# Patient Record
Sex: Female | Born: 1990
Health system: Southern US, Community
[De-identification: ages and names within clinical notes are randomized; demographics above are authoritative.]

## PROBLEM LIST (undated history)

## (undated) DIAGNOSIS — F419 Anxiety disorder, unspecified: Secondary | ICD-10-CM

## (undated) DIAGNOSIS — R12 Heartburn: Secondary | ICD-10-CM

## (undated) DIAGNOSIS — K219 Gastro-esophageal reflux disease without esophagitis: Secondary | ICD-10-CM

## (undated) DIAGNOSIS — F329 Major depressive disorder, single episode, unspecified: Secondary | ICD-10-CM

## (undated) DIAGNOSIS — R112 Nausea with vomiting, unspecified: Secondary | ICD-10-CM

## (undated) DIAGNOSIS — F32A Depression, unspecified: Secondary | ICD-10-CM

## (undated) DIAGNOSIS — E7800 Pure hypercholesterolemia, unspecified: Secondary | ICD-10-CM

## (undated) DIAGNOSIS — R011 Cardiac murmur, unspecified: Secondary | ICD-10-CM

## (undated) DIAGNOSIS — F410 Panic disorder [episodic paroxysmal anxiety] without agoraphobia: Secondary | ICD-10-CM

## (undated) DIAGNOSIS — E119 Type 2 diabetes mellitus without complications: Secondary | ICD-10-CM

## (undated) DIAGNOSIS — F429 Obsessive-compulsive disorder, unspecified: Secondary | ICD-10-CM

## (undated) DIAGNOSIS — Z9889 Other specified postprocedural states: Secondary | ICD-10-CM

## (undated) HISTORY — DX: Anxiety disorder, unspecified: F41.9

## (undated) HISTORY — PX: NO PAST SURGERIES: SHX2092

## (undated) HISTORY — DX: Major depressive disorder, single episode, unspecified: F32.9

## (undated) HISTORY — DX: Depression, unspecified: F32.A

## (undated) HISTORY — DX: Panic disorder (episodic paroxysmal anxiety): F41.0

## (undated) HISTORY — PX: OTHER SURGICAL HISTORY: SHX169

---
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
## 2012-07-04 ENCOUNTER — Inpatient Hospital Stay: Payer: Self-pay | Admitting: Psychiatry

## 2012-07-04 LAB — CBC
HCT: 42.9 % (ref 35.0–47.0)
HGB: 14.6 g/dL (ref 12.0–16.0)
MCHC: 33.9 g/dL (ref 32.0–36.0)
MCV: 89 fL (ref 80–100)
Platelet: 368 10*3/uL (ref 150–440)
RBC: 4.84 10*6/uL (ref 3.80–5.20)
RDW: 13.5 % (ref 11.5–14.5)
WBC: 13.6 10*3/uL — ABNORMAL HIGH (ref 3.6–11.0)

## 2012-07-04 LAB — URINALYSIS, COMPLETE
Ph: 6 (ref 4.5–8.0)
Protein: 30
Specific Gravity: 1.026 (ref 1.003–1.030)
Squamous Epithelial: 4

## 2012-07-04 LAB — COMPREHENSIVE METABOLIC PANEL
Albumin: 4.5 g/dL (ref 3.8–5.6)
Anion Gap: 8 (ref 7–16)
BUN: 11 mg/dL (ref 7–18)
Bilirubin,Total: 0.5 mg/dL (ref 0.2–1.0)
Calcium, Total: 9.8 mg/dL (ref 9.0–10.7)
Co2: 25 mmol/L (ref 21–32)
Creatinine: 0.73 mg/dL (ref 0.60–1.30)
EGFR (African American): >60
Glucose: 86 mg/dL (ref 65–99)
Osmolality: 274 (ref 275–301)
SGOT(AST): 21 U/L (ref 0–26)
Total Protein: 9.4 g/dL — ABNORMAL HIGH (ref 6.4–8.6)

## 2012-07-04 LAB — ETHANOL
Ethanol %: <0.003 % (ref 0.000–0.080)
Ethanol: <3 mg/dL

## 2012-07-04 LAB — DRUG SCREEN, URINE
Benzodiazepine, Ur Scrn: NEGATIVE (ref ?–200)
Cannabinoid 50 Ng, Ur ~~LOC~~: NEGATIVE (ref ?–50)
MDMA (Ecstasy)Ur Screen: NEGATIVE (ref ?–500)
Opiate, Ur Screen: NEGATIVE (ref ?–300)
Phencyclidine (PCP) Ur S: NEGATIVE (ref ?–25)
Tricyclic, Ur Screen: NEGATIVE (ref ?–1000)

## 2012-07-04 LAB — TSH: Thyroid Stimulating Horm: 1.42 u[IU]/mL

## 2014-10-15 NOTE — Consult Note (Signed)
Psychological Assessment  Claris CheMargaret [id number] Evaluation: 1-12-14Administered: [facility] Multiphasic Personality Inventory-2 (MMPI-2) for Referral: [name] was referred for a psychological assessment by her physician, Kristine LineaJolanta Pucilowska, MD. She was admitted to Behavioral Medicine for the treatment of suicidal ideation in the context of severe anxiety.  Please see the history and physical and psychosocial history for further background information. An assessment of personality structure was requested. [name]? MMPI-2 protocol is compared to that of other adult females she obtained the following profile: [id number]/:#. The MMPI-2 validity scales indicate that the clinical profile is valid. They also suggest she is experiencing a long standing and serious disturbance. Presentation She reports that she is experiencing a moderate to severe level of emotional distress characterized by dysphoric mood and agitation. She frequently worries about something or someone. She is chronically stressed, and she becomes more agitated or withdrawn as her level of stress increases. She is generally apprehensive and fearful of her environment. Several times a week she feels as if something dreadful is about to happen. She obtains little pleasure from life and is likely to be anhedonic. Her daily life has few things that keep her interested. She is more sensitive and feels more intensely than most people. Her feelings are easily hurt and she is inclined to take things hard. She easily becomes impatient with people. She is often irritable and grouchy. It makes her angry when people hurry her. She also gets angry with herself for giving in to others so much. She has become so angry that she does not know what comes over her and she feels as though she will explode. At times she has a strong urge to do something shocking or harmful. She reports that she has problems with attention, concentration but not memory. She  lacks self-confidence and believes that she is not as good as other people. She has a hard time making decisions and she feels helpless when she has to make some important decisions. She lets other people take charge and make decisions. She has often lost out on things because she could not make up her mind quickly enough. Sometimes some unimportant thought will run through her mind and bother her for days. has had strange and peculiar thoughts and thinks of things that are too bad to talk about. She often thinks that things are not real, there is something wrong with her mind and she is about to go to pieces. Her judgment is not as good as it once was. She has often been misunderstood when she was trying to be helpful. She does many things that she later regrets. Relations: She reports that she is mildly introverted. She may have difficulties with social skills. At parties she is more likely to sit by herself or with one other person than to join in with the crowd. When in a group of people she has trouble thinking of the right things to talk about. She is likely not to speak to people until they speak to her. She is easily embarrassed and wishes she was not so shy. Even when she is with people, she feels lonely much of the time.  Problem Areas: She reports a number of physical symptoms. Her sleep is fitful and disturbed and she does not wake up fresh and rested most mornings. She tires quickly and feels weak and tired a good deal of the time. She does not have enough energy to do her work and she is not as able to work as she once was.  She is worried about and bothered by thoughts about sex. She is likely to have suicidal ideation that should be monitored carefully. She is hopeless, which increases the risk of suicide.  Her prognosis is generally poor given the characterologic nature of her problems and her diminished motivation to work. Establishing a therapeutic relationship is very challenging because of  the character pathology that is present. Psychopharmacologic intervention may be necessary to decrease her level of agitation and to help her sleep. Cognitive-behavioral interventions focused on her depressive and anxious cognitive processes will be beneficial.  Impression:Disorder NOSDepressive DisorderDisorder NOS with dependent features                            Electronic Signatures: Carola Frost (PsyD, HSP-P)  (Signed on 13-Jan-14 14:58)  Authored  Last Updated: 13-Jan-14 14:58 by Carola Frost (PsyD, HSP-P)

## 2014-10-15 NOTE — H&P (Signed)
PATIENT NAME:  Latasha Alexander, Latasha Alexander MR#:  [id number] DATE OF BIRTH:  03/22/1991  DATE OF ADMISSION:  07/04/2012  REFERRING PHYSICIAN: Cecille Amsterdam. Mayford Knife, MD   ATTENDING PHYSICIAN: Daniyal Tabor B. Shanyn Preisler, MD   IDENTIFYING DATA: The patient is a 24 year old female with history of severe anxiety.   CHIEF COMPLAINT: "Alexander want to kill myself."   HISTORY OF PRESENT ILLNESS: The patient has been increasingly anxious since the holidays. Two days ago, she started a new job, has been worried all along that she will not be able to handle it and that people will make fun of her. She started experiencing multiple panic attacks everyday with excessive worries and agoraphobia. She has been increasingly thinking of suicide. Today on her way back from work, she had intrusive suicidal thoughts and wanted to kill herself, feeling that this would be the best way to handle her excessive anxiety. She decided to call her mother and came to the hospital. She reports poor sleep, decreased appetite, anhedonia, feelings of guilt, hopelessness, worthlessness, poor memory and concentration, crying spells, social isolation and extreme anxiety. She reports panic attacks with palpitations, difficulties breathing, shaking, feeling of impending doom. They happen multiple times during the day. She is currently on no medications, not in therapy. She denies any psychotic symptoms. There is no alcohol or prescription pill abuse. She did smoke cannabis around Christmas and has been preoccupied with the idea that smoking pot could bring back severe anxiety that she did experience in the past.   PAST PSYCHIATRIC HISTORY: There were no suicide attempts. However, the patient had 2 periods in her life at the age of 79 when she was teased at school and then at the age of 7 when her boyfriend moved out of the area when she experienced a month or 2 period of excessive anxiety and panic attacks. When she was a child, she was in therapy for it. Two years  ago, it lasted for a month and eventually went away and she was supported by the family and friends.   FAMILY PSYCHIATRIC HISTORY: None reported.   PAST MEDICAL HISTORY: None.   ALLERGIES: No known drug allergies.   MEDICATIONS ON ADMISSION: None.   SOCIAL HISTORY: She graduated from high school. She lives with her mother. She just started a new job that she is ready to quit.   REVIEW OF SYSTEMS:  CONSTITUTIONAL: No fevers or chills. No weight changes.  EYES: No double or blurred vision.  ENT: No hearing loss.  RESPIRATORY: No shortness of breath or cough.  CARDIOVASCULAR: No chest pain or orthopnea.  GASTROINTESTINAL: No abdominal pain, nausea, vomiting or diarrhea.  GENITOURINARY: No incontinence or frequency.  ENDOCRINE: No heat or cold intolerance.  LYMPHATIC: No anemia or easy bruising.  INTEGUMENTARY: No acne or rash.  MUSCULOSKELETAL: No muscle or joint pain.  NEUROLOGIC: No tingling or weakness.  PSYCHIATRIC: See history of present illness for details.   PHYSICAL EXAMINATION:  VITAL SIGNS: Blood pressure 183/74, pulse 104, respirations 20, temperature 98.1.  GENERAL: This is a slightly obese young female, crying and super anxious.  HEENT: The pupils are equal, round and reactive to light. Sclerae anicteric.  NECK: Supple. No thyromegaly.  LUNGS: Clear to auscultation. No dullness to percussion.  HEART: Regular rhythm and rate. No murmurs, rubs or gallops.  ABDOMEN: Soft, nontender, nondistended. Positive bowel sounds.  MUSCULOSKELETAL: Normal muscle strength in all extremities.  SKIN: No rashes or bruises.  LYMPHATIC: No cervical adenopathy.  NEUROLOGIC: Cranial nerves II through XII  are intact.   LABORATORY DATA: Chemistries are within normal limits. Urine pregnancy test negative. Alcohol level zero. LFTs within normal limits. TSH 1.422. Urine tox screen negative for substances. CBC within normal limits except for white blood count of 13.6. Urinalysis is not  suggestive of urinary tract infection.   MENTAL STATUS EXAMINATION ON ADMISSION: The patient is examined in the Emergency Room. She is accompanied by her mother. She is extremely anxious, crying, panicky. She is oriented to person, place, time and situation. She is pleasant, polite and tries to be cooperative. She is wearing hospital scrubs and a yellow shirt. She maintains good eye contact. Her speech is of normal rhythm, rate and volume. Mood is depressed with labile affect. Thought processing is logical and goal oriented. Thought content: She denies suicidal or homicidal ideation right now, but drove to the hospital frightened by pervasive thoughts of hurting herself. There are no delusions or paranoia. There are no auditory or visual hallucinations. Her cognition is grossly intact. Her insight and judgment are fair.   SUICIDE RISK ASSESSMENT ON ADMISSION: This is a patient with history of depression and panic disorder who became extremely anxious after she started her new job, became suicidal.   DIAGNOSES:  AXIS Alexander: Mood disorder, not otherwise specified. Anxiety disorder, not otherwise specified. Rule out panic disorder with agoraphobia. Marijuana abuse.  AXIS II: Deferred.  AXIS III: None.  AXIS IV: Mental illness, substance abuse, stress of starting a new job.  AXIS V: Global Assessment of Functioning on admission 25.   PLAN: The patient was admitted to [facility] Medicine Unit for safety, stabilization and medication management. She was initially placed on suicide precautions and was closely monitored for any unsafe behaviors. She underwent full psychiatric and risk assessment. She received pharmacotherapy, individual and group psychotherapy, substance abuse counseling and support from therapeutic milieu.  1.  Suicidal ideation: The patient is able to contract for safety.  2.  Mood and anxiety: I do not want to start this patient on SSRI. She is so anxious that  she may feel even worse, as is the case with many SSRIs for the first week I do not want to give her too many benzos. Alexander will start Remeron, even though she is slightly obese, in hopes that we can transition to Zoloft or Paxil later. We will offer some benzodiazepines as needed.  3.  Disposition: She will go home.  ____________________________ Ellin GoodieJolanta B. Jennet MaduroPucilowska, MD jbp:jm D: 07/04/2012 16:29:59 ET T: 07/04/2012 17:16:44 ET JOB#: [id number]  cc: Chasidy Janak B. Jennet MaduroPucilowska, MD, <Dictator> Shari ProwsJOLANTA B Ramsie Ostrander MD ELECTRONICALLY SIGNED 07/07/2012 0:07

## 2015-08-01 ENCOUNTER — Ambulatory Visit
Admission: EM | Admit: 2015-08-01 | Discharge: 2015-08-01 | Disposition: A | Discharge status: Home / Self Care | Payer: BLUE CROSS/BLUE SHIELD | Attending: Family Medicine | Admitting: Family Medicine

## 2015-08-01 ENCOUNTER — Encounter: Payer: Self-pay | Admitting: Emergency Medicine

## 2015-08-01 DIAGNOSIS — S39012A Strain of muscle, fascia and tendon of lower back, initial encounter: Secondary | ICD-10-CM

## 2015-08-01 HISTORY — DX: Obsessive-compulsive disorder, unspecified: F42.9

## 2015-08-01 LAB — URINALYSIS COMPLETE WITH MICROSCOPIC (ARMC ONLY)
BACTERIA UA: NONE SEEN
Bilirubin Urine: NEGATIVE
Glucose, UA: NEGATIVE mg/dL
HGB URINE DIPSTICK: NEGATIVE
Ketones, ur: NEGATIVE mg/dL
Leukocytes, UA: NEGATIVE
NITRITE: NEGATIVE
Protein, ur: NEGATIVE mg/dL
RBC / HPF: NONE SEEN RBC/hpf (ref 0–5)
Specific Gravity, Urine: 1.015 (ref 1.005–1.030)
WBC UA: NONE SEEN WBC/hpf (ref 0–5)
pH: 6.5 (ref 5.0–8.0)

## 2015-08-01 MED ORDER — CYCLOBENZAPRINE HCL 10 MG PO TABS: 10.0000 mg | ORAL_TABLET | Freq: Every day | ORAL | Status: DC

## 2015-08-01 MED ORDER — IBUPROFEN 800 MG PO TABS: 800.0000 mg | ORAL_TABLET | Freq: Three times a day (TID) | ORAL | Status: DC | PRN

## 2015-08-01 NOTE — ED Provider Notes (Signed)
CSN: [id number]     Arrival date & time 08/01/15  1124 History   First MD Initiated Contact with Patient 08/01/15 1223     Chief Complaint  Patient presents with  . Back Pain   (Consider location/radiation/quality/duration/timing/severity/associated sxs/prior Treatment) Patient is a 25 y.o. female presenting with back pain. The history is provided by the patient.  Back Pain Location:  Lumbar spine Quality:  Aching Radiates to:  Does not radiate Pain severity:  Mild Pain is:  Same all the time Onset quality:  Sudden (states woke up one day with the pain) Duration:  4 days Timing:  Constant Progression:  Unchanged Context: not emotional stress, not falling, not jumping from heights, not lifting heavy objects, not MCA, not MVA, not occupational injury, not pedestrian accident, not physical stress, not recent illness, not recent injury and not twisting   Context comment:  Unknown; patient denies any injuries; states woke up one morning with pain Relieved by:  None tried Associated symptoms: no abdominal pain, no abdominal swelling, no bladder incontinence, no bowel incontinence, no chest pain, no fever, no headaches, no leg pain, no numbness, no paresthesias, no pelvic pain, no perianal numbness, no tingling, no weakness and no weight loss     Past Medical History  Diagnosis Date  . OCD (obsessive compulsive disorder)    History reviewed. No pertinent past surgical history. History reviewed. No pertinent family history. Social History  Substance Use Topics  . Smoking status: Never Smoker   . Smokeless tobacco: None  . Alcohol Use: No   OB History    No data available     Review of Systems  Constitutional: Negative for fever and weight loss.  Cardiovascular: Negative for chest pain.  Gastrointestinal: Negative for abdominal pain and bowel incontinence.  Genitourinary: Negative for bladder incontinence and pelvic pain.  Musculoskeletal: Positive for back pain.  Neurological:  Negative for tingling, weakness, numbness, headaches and paresthesias.    Allergies  Review of patient's allergies indicates no known allergies.  Home Medications   Prior to Admission medications   Medication Sig Start Date End Date Taking? Authorizing Provider  ARIPiprazole (ABILIFY) 5 MG tablet Take 5 mg by mouth daily.   Yes Historical Provider, MD  norethindrone (MICRONOR,CAMILA,ERRIN) 0.35 MG tablet Take 1 tablet by mouth daily.   Yes Historical Provider, MD  sertraline (ZOLOFT) 100 MG tablet Take 200 mg by mouth daily.   Yes Historical Provider, MD  cyclobenzaprine (FLEXERIL) 10 MG tablet Take 1 tablet (10 mg total) by mouth at bedtime. 08/01/15   Payton Mccallum, MD  ibuprofen (ADVIL,MOTRIN) 800 MG tablet Take 1 tablet (800 mg total) by mouth every 8 (eight) hours as needed. 08/01/15   Payton Mccallum, MD   Meds Ordered and Administered this Visit  Medications - No data to display  BP 141/79 mmHg  Pulse 70  Temp(Src) 98.5 F (36.9 C) (Tympanic)  Resp 17  Ht  (1.651 m)  Wt 279 lb (126.554 kg)  BMI 46.43 kg/m2  SpO2 99%  LMP 07/24/2015 (Exact Date) No data found.   Physical Exam  Constitutional: She appears well-developed and well-nourished. No distress.  Musculoskeletal: She exhibits tenderness. She exhibits no edema.       Lumbar back: She exhibits tenderness (over the left lumbar paraspinous muscles) and spasm. She exhibits normal range of motion, no bony tenderness, no swelling, no edema, no deformity, no laceration, no pain and normal pulse.  Neurological: She is alert. She has normal reflexes. She exhibits normal  muscle tone.  Skin: Skin is warm and dry. No rash noted. She is not diaphoretic. No erythema.  Nursing note and vitals reviewed.   ED Course  Procedures (including critical care time)  Labs Review Labs Reviewed  URINALYSIS COMPLETEWITH MICROSCOPIC [facility] ONLY) - Abnormal; Notable for the following:    Squamous Epithelial / LPF 0-5 (*)    All other  components within normal limits    Imaging Review No results found.   Visual Acuity Review  Right Eye Distance:   Left Eye Distance:   Bilateral Distance:    Right Eye Near:   Left Eye Near:    Bilateral Near:         MDM   1. Lumbar strain, initial encounter    Discharge Medication List as of 08/01/2015 12:45 PM    START taking these medications   Details  cyclobenzaprine (FLEXERIL) 10 MG tablet Take 1 tablet (10 mg total) by mouth at bedtime., Starting 08/01/2015, Until Discontinued, Normal    ibuprofen (ADVIL,MOTRIN) 800 MG tablet Take 1 tablet (800 mg total) by mouth every 8 (eight) hours as needed., Starting 08/01/2015, Until Discontinued, Normal       1. Labs/x-ray results and diagnosis reviewed with patient 2. rx as per orders above; reviewed possible side effects, interactions, risks and benefits  3. Recommend supportive treatment with heat to affected area; gentle low back stretching 4. Follow-up prn if symptoms worsen or don't improve    Payton Mccallum, MD 08/01/15 1331

## 2015-08-01 NOTE — ED Notes (Signed)
Patient c/o lower back pain for the past 4 days.  Patient denies N/V.  Patient denies fevers.

## 2015-09-18 ENCOUNTER — Ambulatory Visit
Admission: EM | Admit: 2015-09-18 | Discharge: 2015-09-18 | Disposition: A | Discharge status: Home / Self Care | Payer: BLUE CROSS/BLUE SHIELD | Attending: Family Medicine | Admitting: Family Medicine

## 2015-09-18 DIAGNOSIS — L049 Acute lymphadenitis, unspecified: Secondary | ICD-10-CM | POA: Diagnosis not present

## 2015-09-18 MED ORDER — AMOXICILLIN-POT CLAVULANATE 875-125 MG PO TABS: 1.0000 | ORAL_TABLET | Freq: Two times a day (BID) | ORAL | Status: DC

## 2015-09-18 NOTE — ED Notes (Signed)
Patient complains of facial/neck swelling. States that she noticed this problem, this morning. Patient states that area hurts when she eats or turns her head.

## 2015-09-18 NOTE — ED Provider Notes (Signed)
CSN: [id number]     Arrival date & time 09/18/15  1327 History   First MD Initiated Contact with Patient 09/18/15 1526     Chief Complaint  Patient presents with  . Facial Swelling   (Consider location/radiation/quality/duration/timing/severity/associated sxs/prior Treatment) HPI Comments: Patient presents with 1 day onset right mandibular facial swelling.  No fever,chills, otalgia, odontalgia, or TMJ discomfort.  Patient recently had her frenula pierced.   The history is provided by the patient.    Past Medical History  Diagnosis Date  . OCD (obsessive compulsive disorder)    Past Surgical History  Procedure Laterality Date  . No past surgeries     History reviewed. No pertinent family history. Social History  Substance Use Topics  . Smoking status: Never Smoker   . Smokeless tobacco: None  . Alcohol Use: No   OB History    No data available     Review of Systems  Constitutional: Negative for fever, chills and diaphoresis.  HENT: Positive for facial swelling. Negative for congestion, ear pain, hearing loss and postnasal drip.   Eyes: Negative.  Negative for discharge and redness.  Respiratory: Negative.  Negative for cough, shortness of breath and wheezing.   Cardiovascular: Negative.   Endocrine: Negative.   Musculoskeletal: Positive for neck pain. Negative for myalgias.  Skin: Negative for color change, pallor and rash.    Allergies  Review of patient's allergies indicates no known allergies.  Home Medications   Prior to Admission medications   Medication Sig Start Date End Date Taking? Authorizing Provider  ARIPiprazole (ABILIFY) 5 MG tablet Take 5 mg by mouth daily.   Yes Historical Provider, MD  ibuprofen (ADVIL,MOTRIN) 800 MG tablet Take 1 tablet (800 mg total) by mouth every 8 (eight) hours as needed. 08/01/15  Yes Payton Mccallumrlando Conty, MD  norethindrone (MICRONOR,CAMILA,ERRIN) 0.35 MG tablet Take 1 tablet by mouth daily.   Yes Historical Provider, MD  sertraline  (ZOLOFT) 100 MG tablet Take 200 mg by mouth daily.   Yes Historical Provider, MD  amoxicillin-clavulanate (AUGMENTIN) 875-125 MG tablet Take 1 tablet by mouth every 12 (twelve) hours. 09/18/15   Duanne Limerickeanna C Jones, MD  cyclobenzaprine (FLEXERIL) 10 MG tablet Take 1 tablet (10 mg total) by mouth at bedtime. 08/01/15   Payton Mccallumrlando Conty, MD   Meds Ordered and Administered this Visit  Medications - No data to display  BP 117/62 mmHg  Pulse 72  Temp(Src) 98 F (36.7 C) (Tympanic)  Resp 17  Ht 5\' 5"  (1.651 m)  Wt 277 lb (125.646 kg)  BMI 46.10 kg/m2  SpO2 100%  LMP 08/21/2015 No data found.   Physical Exam  Constitutional: She appears well-developed and well-nourished.  HENT:  Head: Normocephalic.  Right Ear: External ear normal.  Left Ear: External ear normal.  Nose: Nose normal.  Mouth/Throat: Uvula is midline and mucous membranes are normal. Posterior oropharyngeal erythema present. No oropharyngeal exudate, posterior oropharyngeal edema or tonsillar abscesses.  Frenula without swelling or erythema.  Eyes: Pupils are equal, round, and reactive to light.  Neck: Normal range of motion. Neck supple. No thyromegaly present.  Tenderness mild right submandibular  Lymphadenopathy:    She has cervical adenopathy.  Nursing note and vitals reviewed.   ED Course  Procedures (including critical care time)  Labs Review Labs Reviewed - No data to display  Imaging Review No results found.   Visual Acuity Review  Right Eye Distance:   Left Eye Distance:   Bilateral Distance:    Right Eye Near:  Left Eye Near:    Bilateral Near:         MDM   1. Adenitis, acute    New Prescriptions   AMOXICILLIN-CLAVULANATE (AUGMENTIN) 875-125 MG TABLET    Take 1 tablet by mouth every 12 (twelve) hours.      Duanne Limerick, MD 09/18/15 [id number]

## 2016-06-06 ENCOUNTER — Ambulatory Visit (INDEPENDENT_AMBULATORY_CARE_PROVIDER_SITE_OTHER): Payer: Medicaid Other

## 2016-06-06 VITALS — BP 127/83 | HR 81 | Ht 65.0 in | Wt 301.4 lb

## 2016-06-06 DIAGNOSIS — Z369 Encounter for antenatal screening, unspecified: Secondary | ICD-10-CM

## 2016-06-06 DIAGNOSIS — Z6841 Body Mass Index (BMI) 40.0 and over, adult: Secondary | ICD-10-CM

## 2016-06-06 DIAGNOSIS — Z113 Encounter for screening for infections with a predominantly sexual mode of transmission: Secondary | ICD-10-CM

## 2016-06-06 DIAGNOSIS — Z1389 Encounter for screening for other disorder: Secondary | ICD-10-CM

## 2016-06-06 DIAGNOSIS — Z3401 Encounter for supervision of normal first pregnancy, first trimester: Secondary | ICD-10-CM

## 2016-06-06 DIAGNOSIS — N926 Irregular menstruation, unspecified: Secondary | ICD-10-CM

## 2016-06-06 DIAGNOSIS — O99211 Obesity complicating pregnancy, first trimester: Secondary | ICD-10-CM

## 2016-06-06 NOTE — Patient Instructions (Signed)
Commonly Asked Questions During Pregnancy  Cats: A parasite can be excreted in cat feces.  To avoid exposure you need to have another person empty the little box.  If you must empty the litter box you will need to wear gloves.  Wash your hands after handling your cat.  This parasite can also be found in raw or undercooked meat so this should also be avoided.  Colds, Sore Throats, Flu: Please check your medication sheet to see what you can take for symptoms.  If your symptoms are unrelieved by these medications please call the office.  Dental Work: Most any dental work your dentist recommends is permitted.  X-rays should only be taken during the first trimester if absolutely necessary.  Your abdomen should be shielded with a lead apron during all x-rays.  Please notify your provider prior to receiving any x-rays.  Novocaine is fine; gas is not recommended.  If your dentist requires a note from us prior to dental work please call the office and we will provide one for you.  Exercise: Exercise is an important part of staying healthy during your pregnancy.  You may continue most exercises you were accustomed to prior to pregnancy.  Later in your pregnancy you will most likely notice you have difficulty with activities requiring balance like riding a bicycle.  It is important that you listen to your body and avoid activities that put you at a higher risk of falling.  Adequate rest and staying well hydrated are a must!  If you have questions about the safety of specific activities ask your provider.    Exposure to Children with illness: Try to avoid obvious exposure; report any symptoms to us when noted,  If you have chicken pos, red measles or mumps, you should be immune to these diseases.   Please do not take any vaccines while pregnant unless you have checked with your OB provider.  Fetal Movement: After 28 weeks we recommend you do "kick counts" twice daily.  Lie or sit down in a calm quiet environment and  count your baby movements "kicks".  You should feel your baby at least 10 times per hour.  If you have not felt 10 kicks within the first hour get up, walk around and have something sweet to eat or drink then repeat for an additional hour.  If count remains less than 10 per hour notify your provider.  Fumigating: Follow your pest control agent's advice as to how long to stay out of your home.  Ventilate the area well before re-entering.  Hemorrhoids:   Most over-the-counter preparations can be used during pregnancy.  Check your medication to see what is safe to use.  It is important to use a stool softener or fiber in your diet and to drink lots of liquids.  If hemorrhoids seem to be getting worse please call the office.   Hot Tubs:  Hot tubs Jacuzzis and saunas are not recommended while pregnant.  These increase your internal body temperature and should be avoided.  Intercourse:  Sexual intercourse is safe during pregnancy as long as you are comfortable, unless otherwise advised by your provider.  Spotting may occur after intercourse; report any bright red bleeding that is heavier than spotting.  Labor:  If you know that you are in labor, please go to the hospital.  If you are unsure, please call the office and let us help you decide what to do.  Lifting, straining, etc:  If your job requires heavy   lifting or straining please check with your provider for any limitations.  Generally, you should not lift items heavier than that you can lift simply with your hands and arms (no back muscles)  Painting:  Paint fumes do not harm your pregnancy, but may make you ill and should be avoided if possible.  Latex or water based paints have less odor than oils.  Use adequate ventilation while painting.  Permanents & Hair Color:  Chemicals in hair dyes are not recommended as they cause increase hair dryness which can increase hair loss during pregnancy.  " Highlighting" and permanents are allowed.  Dye may be  absorbed differently and permanents may not hold as well during pregnancy.  Sunbathing:  Use a sunscreen, as skin burns easily during pregnancy.  Drink plenty of fluids; avoid over heating.  Tanning Beds:  Because their possible side effects are still unknown, tanning beds are not recommended.  Ultrasound Scans:  Routine ultrasounds are performed at approximately 20 weeks.  You will be able to see your baby's general anatomy an if you would like to know the gender this can usually be determined as well.  If it is questionable when you conceived you may also receive an ultrasound early in your pregnancy for dating purposes.  Otherwise ultrasound exams are not routinely performed unless there is a medical necessity.  Although you can request a scan we ask that you pay for it when conducted because insurance does not cover " patient request" scans.  Work: If your pregnancy proceeds without complications you may work until your due date, unless your physician or employer advises otherwise.  Round Ligament Pain/Pelvic Discomfort:  Sharp, shooting pains not associated with bleeding are fairly common, usually occurring in the second trimester of pregnancy.  They tend to be worse when standing up or when you remain standing for long periods of time.  These are the result of pressure of certain pelvic ligaments called "round ligaments".  Rest, Tylenol and heat seem to be the most effective relief.  As the womb and fetus grow, they rise out of the pelvis and the discomfort improves.  Please notify the office if your pain seems different than that described.  It may represent a more serious condition.  Hyperemesis Gravidarum Hyperemesis gravidarum is a severe form of nausea and vomiting that happens during pregnancy. Hyperemesis is worse than morning sickness. It may cause you to have nausea or vomiting all day for many days. It may keep you from eating and drinking enough food and liquids. Hyperemesis usually  occurs during the first half (the first 20 weeks) of pregnancy. It often goes away once a woman is in her second half of pregnancy. However, sometimes hyperemesis continues through an entire pregnancy. What are the causes? The cause of this condition is not known. It may be related to changes in chemicals (hormones) in the body during pregnancy, such as the high level of pregnancy hormone (human chorionic gonadotropin) or the increase in the female sex hormone (estrogen). What are the signs or symptoms? Symptoms of this condition include:  Severe nausea and vomiting.  Nausea that does not go away.  Vomiting that does not allow you to keep any food down.  Weight loss.  Body fluid loss (dehydration).  Having no desire to eat, or not liking food that you have previously enjoyed. How is this diagnosed? This condition may be diagnosed based on:  A physical exam.  Your medical history.  Your symptoms.  Blood tests.    Urine tests. How is this treated? This condition may be managed with medicine. If medicines to do not help relieve nausea and vomiting, you may need to receive fluids through an IV tube at the hospital. Follow these instructions at home:  Take over-the-counter and prescription medicines only as told by your health care provider.  Avoid iron pills and multivitamins that contain iron for the first 3-4 months of pregnancy. If you take prescription iron pills, do not stop taking them unless your health care provider approves.  Take the following actions to help prevent nausea and vomiting:  In the morning, before getting out of bed, try eating a couple of dry crackers or a piece of toast.  Avoid foods and smells that upset your stomach. Fatty and spicy foods may make nausea worse.  Eat 5-6 small meals a day.  Do not drink fluids while eating meals. Drink between meals.  Eat or suck on things that have ginger in them. Ginger can help relieve nausea.  Avoid food  preparation. The smell of food can spoil your appetite or trigger nausea.  Follow instructions from your health care provider about eating or drinking restrictions.  For snacks, eat high-protein foods, such as cheese.  Keep all follow-up and pre-birth (prenatal) visits as told by your health care provider. This is important. Contact a health care provider if:  You have pain in your abdomen.  You have a severe headache.  You have vision problems.  You are losing weight. Get help right away if:  You cannot drink fluids without vomiting.  You vomit blood.  You have constant nausea and vomiting.  You are very weak.  You are very thirsty.  You feel dizzy.  You faint.  You have a fever or other symptoms that last for more than 2-3 days.  You have a fever and your symptoms suddenly get worse. Summary  Hyperemesis gravidarum is a severe form of nausea and vomiting that happens during pregnancy.  Making some changes to your eating habits may help relieve nausea and vomiting.  This condition may be managed with medicine.  If medicines to do not help relieve nausea and vomiting, you may need to receive fluids through an IV tube at the hospital. This information is not intended to replace advice given to you by your health care provider. Make sure you discuss any questions you have with your health care provider. Document Released: 06/11/2005 Document Revised: 02/08/2016 Document Reviewed: 02/08/2016 Elsevier Interactive Patient Education  2017 [employer] Minor Illnesses and Medications in Pregnancy  Cold/Flu:  Sudafed for congestion- Robitussin (plain) for cough- Tylenol for discomfort.  Please follow the directions on the label.  Try not to take any more than needed.  OTC Saline nasal spray and air humidifier or cool-mist  Vaporizer to sooth nasal irritation and to loosen congestion.  It is also important to increase intake of non carbonated fluids, especially if you have a  fever.  Constipation:  Colace-2 capsules at bedtime; Metamucil- follow directions on label; Senokot- 1 tablet at bedtime.  Any one of these medications can be used.  It is also very important to increase fluids and fruits along with regular exercise.  If problem persists please call the office.  Diarrhea:  Kaopectate as directed on the label.  Eat a bland diet and increase fluids.  Avoid highly seasoned foods.  Headache:  Tylenol 1 or 2 tablets every 3-4 hours as needed  Indigestion:  Maalox, Mylanta, Tums or Rolaids- as directed on   label.  Also try to eat small meals and avoid fatty, greasy or spicy foods.  Nausea with or without Vomiting:  Nausea in pregnancy is caused by increased levels of hormones in the body which influence the digestive system and cause irritation when stomach acids accumulate.  Symptoms usually subside after 1st trimester of pregnancy.  Try the following: 1. Keep saltines, graham crackers or dry toast by your bed to eat upon awakening. 2. Don't let your stomach get empty.  Try to eat 5-6 small meals per day instead of 3 large ones. 3. Avoid greasy fatty or highly seasoned foods.  4. Take OTC Unisom 1 tablet at bed time along with OTC Vitamin B6 25-50 mg 3 times per day.    If nausea continues with vomiting and you are unable to keep down food and fluids you may need a prescription medication.  Please notify your provider.   Sore throat:  Chloraseptic spray, throat lozenges and or plain Tylenol.  Vaginal Yeast Infection:  OTC Monistat for 7 days as directed on label.  If symptoms do not resolve within a week notify provider.  If any of the above problems do not subside with recommended treatment please call the office for further assistance.   Do not take Aspirin, Advil, Motrin or Ibuprofen.  * * OTC= Over the counter Pregnancy and Zika Virus Disease Introduction Zika virus disease, or Zika, is an illness that can spread to people from mosquitoes that carry the virus.  It may also spread from person to person through infected body fluids. Zika first occurred in [place], but recently it has spread to new areas. The virus occurs in tropical climates. The location of Zika continues to change. Most people who become infected with Zika virus do not develop serious illness. However, Zika may cause birth defects in an unborn baby whose mother is infected with the virus. It may also increase the risk of miscarriage. What are the symptoms of Zika virus disease? In many cases, people who have been infected with Zika virus do not develop any symptoms. If symptoms appear, they usually start about a week after the person is infected. Symptoms are usually mild. They may include:  Fever.  Rash.  Red eyes.  Joint pain. How does Zika virus disease spread? The main way that Zika virus spreads is through the bite of a certain type of mosquito. Unlike most types of mosquitos, which bite only at night, the type of mosquito that carries Zika virus bites both at night and during the day. Zika virus can also spread through sexual contact, through a blood transfusion, and from a mother to her baby before or during birth. Once you have had Zika virus disease, it is unlikely that you will get it again. Can I pass Zika to my baby during pregnancy? Yes, Zika can pass from a mother to her baby before or during birth. What problems can Zika cause for my baby? A woman who is infected with Zika virus while pregnant is at risk of having her baby born with a condition in which the brain or head is smaller than expected (microcephaly). Babies who have microcephaly can have developmental delays, seizures, hearing problems, and vision problems. Having Zika virus disease during pregnancy can also increase the risk of miscarriage. How can Zika virus disease be prevented? There is no vaccine to prevent Zika. The best way to prevent the disease is to avoid infected mosquitoes and avoid exposure to  body fluids that can spread   the virus. Avoid any possible exposure to Zika by taking the following precautions. For women and their sex partners:  Avoid traveling to high-risk areas. The locations where Zika is being reported change often. To identify high-risk areas, check the CDC travel website: www.cdc.gov/zika/geo/index.html  If you or your sex partner must travel to a high-risk area, talk with a health care provider before and after traveling.  Take all precautions to avoid mosquito bites if you live in, or travel to, any of the high-risk areas. Insect repellents are safe to use during pregnancy.  Ask your health care provider when it is safe to have sexual contact. For women:  If you are pregnant or trying to become pregnant, avoid sexual contact with persons who may have been exposed to Zika virus, persons who have possible symptoms of Zika, or persons whose history you are unsure about. If you choose to have sexual contact with someone who may have been exposed to Zika virus, use condoms correctly during the entire duration of sexual activity, every time. Do not share sexual devices, as you may be exposed to body fluids.  Ask your health care provider about when it is safe to attempt pregnancy after a possible exposure to Zika virus. What steps should I take to avoid mosquito bites? Take these steps to avoid mosquito bites when you are in a high-risk area:  Wear loose clothing that covers your arms and legs.  Limit your outdoor activities.  Do not open windows unless they have window screens.  Sleep under mosquito nets.  Use insect repellent. The best insect repellents have:  DEET, picaridin, oil of lemon eucalyptus (OLE), or IR3535 in them.  Higher amounts of an active ingredient in them.  Remember that insect repellents are safe to use during pregnancy.  Do not use OLE on children who are younger than 3 years of age. Do not use insect repellent on babies who are younger  than 2 months of age.  Cover your child's stroller with mosquito netting. Make sure the netting fits snugly and that any loose netting does not cover your child's mouth or nose. Do not use a blanket as a mosquito-protection cover.  Do not apply insect repellent underneath clothing.  If you are using sunscreen, apply the sunscreen before applying the insect repellent.  Treat clothing with permethrin. Do not apply permethrin directly to your skin. Follow label directions for safe use.  Get rid of standing water, where mosquitoes may reproduce. Standing water is often found in items such as buckets, bowls, animal food dishes, and flowerpots. When you return from traveling to any high-risk area, continue taking actions to protect yourself against mosquito bites for 3 weeks, even if you show no signs of illness. This will prevent spreading Zika virus to uninfected mosquitoes. What should I know about the sexual transmission of Zika? People can spread Zika to their sexual partners during vaginal, anal, or oral sex, or by sharing sexual devices. Many people with Zika do not develop symptoms, so a person could spread the disease without knowing that they are infected. The greatest risk is to women who are pregnant or who may become pregnant. Zika virus can live longer in semen than it can live in blood. Couples can prevent sexual transmission of the virus by:  Using condoms correctly during the entire duration of sexual activity, every time. This includes vaginal, anal, and oral sex.  Not sharing sexual devices. Sharing increases your risk of being exposed to body fluid from   another person.  Avoiding all sexual activity until your health care provider says it is safe. Should I be tested for Zika virus? A sample of your blood can be tested for Zika virus. A pregnant woman should be tested if she may have been exposed to the virus or if she has symptoms of Zika. She may also have additional tests done  during her pregnancy, such ultrasound testing. Talk with your health care provider about which tests are recommended. This information is not intended to replace advice given to you by your health care provider. Make sure you discuss any questions you have with your health care provider. Document Released: 03/02/2015 Document Revised: 11/17/2015 Document Reviewed: 02/23/2015  2017 Elsevier  

## 2016-06-06 NOTE — Progress Notes (Signed)
Latasha Alexander presents for NOB nurse interview visit. Pregnancy confirmation done 05/31/2016 at [facility] with positive UPT.  G-1  P-0000. LMP-04/22/2016. Pt has h/o irregular periods. Ultrasound ordered for dating and viability. Pregnancy education material explained and given.  Cats in the home but pt does not clean litter box or mess with the cats.  NOB labs ordered. TSH/HbgA1c due to Increased BMI,  HIV labs and Drug screen were explained optional and she did not decline. Drug screen ordered. Pt states she tested positive for Xanax at [facility] on 05/31/2016 but states she has not taken any Xanax. Also pt has stopped taking all of her medications: Abilify, Zoloft.  Also her B/P was elevated there at 140/94. I advised pt to take it at home and let us know if this continues and gave her the ranges of high b/p. Also has history of panic attacks but has not had any since she stopped her medication PNV encouraged. Genetic screening, to discuss with provider. Pt. To follow up with provider in 5 weeks for NOB physical.  All questions answered.

## 2016-06-07 ENCOUNTER — Ambulatory Visit (INDEPENDENT_AMBULATORY_CARE_PROVIDER_SITE_OTHER): Payer: Medicaid Other

## 2016-06-07 DIAGNOSIS — Z3401 Encounter for supervision of normal first pregnancy, first trimester: Secondary | ICD-10-CM | POA: Diagnosis not present

## 2016-06-07 DIAGNOSIS — N926 Irregular menstruation, unspecified: Secondary | ICD-10-CM | POA: Diagnosis not present

## 2016-06-07 DIAGNOSIS — Z369 Encounter for antenatal screening, unspecified: Secondary | ICD-10-CM

## 2016-06-07 DIAGNOSIS — Z3481 Encounter for supervision of other normal pregnancy, first trimester: Secondary | ICD-10-CM | POA: Diagnosis not present

## 2016-06-07 LAB — CBC WITH DIFFERENTIAL/PLATELET
Basophils Absolute: 0 10*3/uL (ref 0.0–0.2)
Basos: 0 %
EOS (ABSOLUTE): 0.1 10*3/uL (ref 0.0–0.4)
Eos: 1 %
HEMATOCRIT: 39.7 % (ref 34.0–46.6)
Hemoglobin: 13.6 g/dL (ref 11.1–15.9)
Immature Grans (Abs): 0 10*3/uL (ref 0.0–0.1)
Immature Granulocytes: 0 %
LYMPHS ABS: 2.5 10*3/uL (ref 0.7–3.1)
Lymphs: 24 %
MCH: 30.2 pg (ref 26.6–33.0)
MCHC: 34.3 g/dL (ref 31.5–35.7)
MCV: 88 fL (ref 79–97)
MONOS ABS: 0.9 10*3/uL (ref 0.1–0.9)
Monocytes: 8 %
NEUTROS PCT: 67 %
Neutrophils Absolute: 7 10*3/uL (ref 1.4–7.0)
PLATELETS: 373 10*3/uL (ref 150–379)
RBC: 4.5 x10E6/uL (ref 3.77–5.28)
RDW: 13.5 % (ref 12.3–15.4)
WBC: 10.5 10*3/uL (ref 3.4–10.8)

## 2016-06-07 LAB — RUBELLA SCREEN: RUBELLA: 6.7 {index} (ref >0.99)

## 2016-06-07 LAB — VARICELLA ZOSTER ANTIBODY, IGG: VARICELLA: 227 {index} (ref >165)

## 2016-06-07 LAB — TSH: TSH: 3.53 u[IU]/mL (ref 0.450–4.500)

## 2016-06-07 LAB — HEMOGLOBIN A1C
Est. average glucose Bld gHb Est-mCnc: 105 mg/dL
HEMOGLOBIN A1C: 5.3 % (ref 4.8–5.6)

## 2016-06-07 LAB — HEPATITIS B SURFACE ANTIGEN: Hepatitis B Surface Ag: NEGATIVE

## 2016-06-07 LAB — HIV ANTIBODY (ROUTINE TESTING W REFLEX): HIV Screen 4th Generation wRfx: NONREACTIVE

## 2016-06-07 LAB — ANTIBODY SCREEN: Antibody Screen: NEGATIVE

## 2016-06-07 LAB — RH TYPE: Rh Factor: POSITIVE

## 2016-06-07 LAB — ABO

## 2016-06-07 LAB — RPR: RPR Ser Ql: NONREACTIVE

## 2016-06-08 LAB — MONITOR DRUG PROFILE 14(MW)
AMPHETAMINE SCREEN URINE: NEGATIVE ng/mL
BARBITURATE SCREEN URINE: NEGATIVE ng/mL
BENZODIAZEPINE SCREEN, URINE: NEGATIVE ng/mL
BUPRENORPHINE, URINE: NEGATIVE ng/mL
CANNABINOIDS UR QL SCN: NEGATIVE ng/mL
CREATININE(CRT), U: 76 mg/dL (ref 20.0–300.0)
Cocaine (Metab) Scrn, Ur: NEGATIVE ng/mL
FENTANYL, URINE: NEGATIVE pg/mL
Meperidine Screen, Urine: NEGATIVE ng/mL
Methadone Screen, Urine: NEGATIVE ng/mL
OPIATE SCREEN URINE: NEGATIVE ng/mL
OXYCODONE+OXYMORPHONE UR QL SCN: NEGATIVE ng/mL
PH UR, DRUG SCRN: 8.8 (ref 4.5–8.9)
PHENCYCLIDINE QUANTITATIVE URINE: NEGATIVE ng/mL
Propoxyphene Scrn, Ur: NEGATIVE ng/mL
SPECIFIC GRAVITY: 1.013
Tramadol Screen, Urine: NEGATIVE ng/mL

## 2016-06-08 LAB — URINALYSIS, ROUTINE W REFLEX MICROSCOPIC
Bilirubin, UA: NEGATIVE
GLUCOSE, UA: NEGATIVE
KETONES UA: NEGATIVE
Leukocytes, UA: NEGATIVE
NITRITE UA: NEGATIVE
Protein, UA: NEGATIVE
RBC, UA: NEGATIVE
Specific Gravity, UA: 1.014 (ref 1.005–1.030)
UUROB: 0.2 mg/dL (ref 0.2–1.0)
pH, UA: 7.5 (ref 5.0–7.5)

## 2016-06-08 LAB — GC/CHLAMYDIA PROBE AMP
CHLAMYDIA, DNA PROBE: NEGATIVE
Neisseria gonorrhoeae by PCR: NEGATIVE

## 2016-06-08 LAB — NICOTINE SCREEN, URINE: COTININE UR QL SCN: NEGATIVE ng/mL

## 2016-06-11 ENCOUNTER — Telehealth: Payer: Self-pay | Admitting: Obstetrics and Gynecology

## 2016-06-11 ENCOUNTER — Telehealth: Payer: Self-pay

## 2016-06-11 ENCOUNTER — Encounter: Payer: Self-pay | Admitting: Obstetrics and Gynecology

## 2016-06-11 DIAGNOSIS — O2341 Unspecified infection of urinary tract in pregnancy, first trimester: Secondary | ICD-10-CM

## 2016-06-11 LAB — URINE CULTURE, OB REFLEX

## 2016-06-11 LAB — CULTURE, OB URINE

## 2016-06-11 MED ORDER — CEPHALEXIN 500 MG PO CAPS: 500.0000 mg | ORAL_CAPSULE | Freq: Two times a day (BID) | ORAL | 0 refills | Status: AC

## 2016-06-11 NOTE — Telephone Encounter (Signed)
Patient called stating she has some brown discharge when she wipes. She is also having frequency, she thinks she may have a uti. Please Advise

## 2016-06-11 NOTE — Telephone Encounter (Signed)
-----   Message from Herold HarmsMartin A Defrancesco, MD sent at 06/11/2016  7:44 AM EST ----- Please notify - Abnormal Labs Call in Keflex 500 mg twice daily for 7 days

## 2016-06-11 NOTE — Telephone Encounter (Signed)
Responded to pt via mychart

## 2016-06-11 NOTE — Telephone Encounter (Signed)
I was unable to attach this to the previous message, it would not allow me to for some reason but pt called back and wanted you you call her back at this number [phone]

## 2016-06-11 NOTE — Telephone Encounter (Signed)
Lmtrc with pt sister. AC has erx keflex to [facility].

## 2016-06-11 NOTE — Telephone Encounter (Signed)
Notes Recorded by Herold HarmsMartin A Defrancesco, MD on 06/11/2016 at 7:44 AM EST Please notify - Abnormal Labs Call in Keflex 500 mg twice daily for 7 days  Pt calls and states that she is having urinary frequency, upon reviewing NOB labs pt does have a UTI, per Dr.De pt is to have Keflex 500mg . Informed pt of this, apologized for delay. RX sent in. Advised pt to increase water intake as well as drink for cranberry juice.

## 2016-06-13 ENCOUNTER — Telehealth: Payer: Self-pay | Admitting: Obstetrics and Gynecology

## 2016-06-13 ENCOUNTER — Encounter: Payer: Self-pay | Admitting: Obstetrics and Gynecology

## 2016-06-13 NOTE — Telephone Encounter (Signed)
Pt called and she wanted a call back, it was a late message on office VM, she said she had light pink discharge and then it turn brown and its only when she wipes, she is in her first trimester of pregnancy.

## 2016-06-14 ENCOUNTER — Telehealth: Payer: Self-pay | Admitting: Obstetrics and Gynecology

## 2016-06-14 NOTE — Telephone Encounter (Signed)
[redacted]wks pregnant and has some pink discharge, cramping.

## 2016-06-14 NOTE — Telephone Encounter (Signed)
Responded to pt via her mychart message.

## 2016-06-14 NOTE — Telephone Encounter (Signed)
Please see previous telephone encounter, pt was responded to via mychart.

## 2016-06-19 ENCOUNTER — Encounter: Payer: Self-pay | Admitting: Obstetrics and Gynecology

## 2016-06-27 ENCOUNTER — Telehealth: Payer: Self-pay | Admitting: Obstetrics and Gynecology

## 2016-06-27 NOTE — Telephone Encounter (Signed)
Patient called stating she is still having uti symptoms and wanted to know if another antibiotic cant be sent in for her. Thanks

## 2016-06-28 ENCOUNTER — Encounter: Payer: Self-pay | Admitting: Obstetrics and Gynecology

## 2016-06-28 ENCOUNTER — Ambulatory Visit (INDEPENDENT_AMBULATORY_CARE_PROVIDER_SITE_OTHER): Payer: Medicaid Other | Admitting: Obstetrics and Gynecology

## 2016-06-28 ENCOUNTER — Ambulatory Visit: Payer: BLUE CROSS/BLUE SHIELD | Admitting: Dietician

## 2016-06-28 VITALS — BP 122/71 | HR 81 | Temp 97.7°F | Wt 300.7 lb

## 2016-06-28 DIAGNOSIS — R3915 Urgency of urination: Secondary | ICD-10-CM

## 2016-06-28 LAB — POCT URINALYSIS DIPSTICK
BILIRUBIN UA: NEGATIVE
Blood, UA: NEGATIVE
GLUCOSE UA: NEGATIVE
KETONES UA: NEGATIVE
Leukocytes, UA: NEGATIVE
Nitrite, UA: NEGATIVE
Protein, UA: NEGATIVE
SPEC GRAV UA: 1.015
Urobilinogen, UA: NEGATIVE
pH, UA: 6

## 2016-06-28 NOTE — Telephone Encounter (Signed)
Called pt she states that she is still having a lot of UIT symptoms after being treated with antibiotic. Pt states that she is not having burning or stinging but is feeling the urge to urinate but not able to go when she tries. Advised pt that she would need to bring in urine sample and be accessed before an antibiotic could be sent in. Will add pt to nurse schedule.  Pt also inquires about whether or not it is safe to drink ginger ale in pregnant, advised pt that it was.

## 2016-06-28 NOTE — Progress Notes (Signed)
Patient ID: Latasha CroissantMargaret I Benavides, female   DOB: Nov 12, 1990, 26 y.o.   MRN: [id number] Pt c/o urinary urgency, voiding a little and when she voids she feels a tingly sensation at night.  No vaginal discharge or odor.  Urinalysis was negative but will send urine culture to lab. Pt may take AZO in the meantime, until UC results return. No ATB ordered today, pending urine culture. Pt aware it may be Monday before we see the results due this culture taking 48-72 hrs to result.

## 2016-06-29 NOTE — Progress Notes (Signed)
I have reviewed the record and concur with patient management and plan.  Liandro Thelin, MD [facility]  

## 2016-07-01 ENCOUNTER — Encounter: Payer: Self-pay | Admitting: Obstetrics and Gynecology

## 2016-07-01 LAB — URINE CULTURE

## 2016-07-05 ENCOUNTER — Encounter: Payer: BLUE CROSS/BLUE SHIELD | Attending: Psychiatry | Admitting: Dietician

## 2016-07-05 ENCOUNTER — Ambulatory Visit (INDEPENDENT_AMBULATORY_CARE_PROVIDER_SITE_OTHER): Payer: Medicaid Other | Admitting: Obstetrics and Gynecology

## 2016-07-05 ENCOUNTER — Encounter: Payer: Self-pay | Admitting: Dietician

## 2016-07-05 VITALS — Ht 65.0 in | Wt 298.0 lb

## 2016-07-05 VITALS — BP 124/80 | HR 101 | Wt 298.9 lb

## 2016-07-05 DIAGNOSIS — O219 Vomiting of pregnancy, unspecified: Secondary | ICD-10-CM

## 2016-07-05 DIAGNOSIS — Z131 Encounter for screening for diabetes mellitus: Secondary | ICD-10-CM

## 2016-07-05 DIAGNOSIS — O0991 Supervision of high risk pregnancy, unspecified, first trimester: Secondary | ICD-10-CM | POA: Diagnosis not present

## 2016-07-05 DIAGNOSIS — Z1379 Encounter for other screening for genetic and chromosomal anomalies: Secondary | ICD-10-CM

## 2016-07-05 DIAGNOSIS — F411 Generalized anxiety disorder: Secondary | ICD-10-CM | POA: Diagnosis not present

## 2016-07-05 DIAGNOSIS — F339 Major depressive disorder, recurrent, unspecified: Secondary | ICD-10-CM

## 2016-07-05 DIAGNOSIS — R399 Unspecified symptoms and signs involving the genitourinary system: Secondary | ICD-10-CM

## 2016-07-05 LAB — POCT URINALYSIS DIPSTICK
BILIRUBIN UA: NEGATIVE
Blood, UA: NEGATIVE
Glucose, UA: NEGATIVE
Ketones, UA: NEGATIVE
LEUKOCYTES UA: NEGATIVE
NITRITE UA: NEGATIVE
PROTEIN UA: NEGATIVE
Spec Grav, UA: 1.015
Urobilinogen, UA: NEGATIVE
pH, UA: 6

## 2016-07-05 MED ORDER — PROMETHAZINE HCL 12.5 MG PO TABS: 12.5000 mg | ORAL_TABLET | Freq: Four times a day (QID) | ORAL | 2 refills | Status: DC | PRN

## 2016-07-05 NOTE — Progress Notes (Signed)
Medical Nutrition Therapy: Visit start time: 12:00 noon   end time: 1300 Assessment:  Diagnosis: anxiety, OCD,  pregnancy  Psychosocial issues/ stress concerns: Patient rates her stress as "moderate". She meets weekly with a therapist. Preferred learning method:  . Auditory  Current weight: 298 lbs Height: 65 in Medications, supplements: see list  Progress and evaluation: Patient in for initial medical nutrition therapy appointment. She is [redacted] weeks gestation with an EDC of 01/28/17. She reports a pre-pregnancy weight of 310 lbs indicating a 12 lb loss. She reports she has nausea "all day" at least 2 days per week and can eat very little on those days except crackers, vanilla wafers and gingerale. On the other days, mainly starches appeal such as noodles, rice, baked fries, and chicken -noodle soup. She is eating some meat but not on a daily basis. She eats approx. 3 oz. cheese daily. She likes a variety of fruits and vegetables but has not been eating regularly. She drinks adequate water.  Her present diet due to nausea of pregnancy is low in protein, fruits, vegetables, whole grains and fiber.  Physical activity:walks 2 days per week  Dietary Intake:  Usual eating pattern includes 3 meals and 1-2snacks per day. Dining out frequency: 4 meals per week.  Breakfast: 10:00am- Ramen noodles or chicken noodle soup Lunch:1:00pm- crackers or vanilla wafers, water, juice Snack:fruit cup Supper: 6:00pm- rice or baked fries, water or juice Beverages: water, juice, gingerale  Nutrition Care Education:  Basic prenatal nutrition: Reviewed food group servings needed to meet prenatal nutrition needs acknowledging the challenge with "morning sickness" especially when it last all day. Discussed ways to increase protein intake. Discussed how meats that she doesn't have to handle raw such as chicken salad or baked nuggets might appeal more than other options. Also discussed protein sources such as  peanut butter or dried beans, both of which she felt she could eat. Affirmed that cheese is offering protein and calcium but encouraged to limit to 3 oz and try to get protein from other sources discussed.  Encouraged to add vegetables to some of the starchy foods she is eating and she felt she could do this. Reviewed food safety guidelines related to listeriosis and food borne illnesses. Nutritional Diagnosis:  NI-5.11.1 Predicted suboptimal nutrient intake As related to lack of appetite due nausea during pregnancy.  As evidenced by diet history..  Intervention:  Include 3 meals per day that include a protein food, carbohydrates (starch, fruit, yogurt) and non-starchy vegetable Include a protein food with each meal. Refer to list. Ex.Add grated cheese to grits for breakfast. Add a piece of fruit. Try peanut butter toast with a pear. If you can't eat breakfast, try [employer] Instant Breakfast added to milk.  Make or buy chicken salad since eating meat cold often does not cause as much nausea. Steam vegetables and add in with the noodles and rice you are eating at lunch and dinner. Eat fruit at least 2 times per day. Bake chicken nuggets or fish sticks for protein to help prevent nausea from working with raw chicken.   Education Materials given:  . Plate Planner . Food lists/ Planning A Balanced Meal . Protein finder . Sample meal pattern/ menus . Goals/ instructions Learner/ who was taught:  . Patient  Level of understanding: . Partial understanding; needs review/ practice Learning barriers: . None Willingness to learn/ readiness for change: . Eager, change in progress Monitoring and Evaluation:  Dietary intake, exercise, , and body weight  follow up: 07/31/16 at 10:30am

## 2016-07-05 NOTE — Progress Notes (Signed)
OBSTETRIC INITIAL PRENATAL VISIT  Subjective:    Latasha Alexander is being seen today for her first obstetrical visit.  This is not a planned pregnancy. She is a G1P0 female at [redacted]w[redacted]d gestation, Estimated Date of Delivery: 01/28/17 with Patient's last menstrual period was 04/18/2016, inconsistent with 6 week sono. Her obstetrical history is significant for obesity. Relationship with FOB: significant other, not living together. Unsure if FOB will be involved. Patient does not intend to breast feed. Pregnancy history fully reviewed.    Obstetric History   G1   P0   T0   P0   A0   L0    SAB0   TAB0   Ectopic0   Multiple0   Live Births0     # Outcome Date GA Lbr Len/2nd Weight Sex Delivery Anes PTL Lv  1 Current               Gynecologic History:  Last pap smear was 1.5 years ago (approximate).  Results were normal.  Denies h/o abnormal pap smears in the past.  Denies history of STIs.  Contraception: Nexplanon, removed 2 months prior to pregnancy   Past Medical History:  Diagnosis Date  . Anxiety   . Depression   . OCD (obsessive compulsive disorder)   . Panic attacks     Family History  Problem Relation Age of Onset  . Diabetes Mother   . Hypertension Mother   . Hypertension Sister   . Stroke Maternal Grandfather     Past Surgical History:  Procedure Laterality Date  . NO PAST SURGERIES    . none      Social History   Social History  . Marital status: Single    Spouse name: N/A  . Number of children: N/A  . Years of education: N/A   Occupational History  . Not on file.   Social History Main Topics  . Smoking status: Never Smoker  . Smokeless tobacco: Never Used  . Alcohol use No  . Drug use: No  . Sexual activity: Yes    Partners: Male    Birth control/ protection: None     Comment: nexplanon taken out about 2 months before she was pregnant   Other Topics Concern  . Not on file   Social History Narrative  . No narrative on file    Current  Outpatient Prescriptions on File Prior to Visit  Medication Sig Dispense Refill  . Prenatal Vit-Fe Fumarate-FA (MULTIVITAMIN-PRENATAL) 27-0.8 MG TABS tablet Take 1 tablet by mouth daily at 12 noon.     No current facility-administered medications on file prior to visit.     No Known Allergies   Review of Systems General:Not Present- Fever, Weight Loss and Weight Gain. Skin:Not Present- Rash. HEENT:Not Present- Blurred Vision, Headache and Bleeding Gums. Respiratory:Not Present- Difficulty Breathing. Breast:Not Present- Breast Mass. Cardiovascular:Not Present- Chest Pain, Elevated Blood Pressure, Fainting / Blacking Out and Shortness of Breath. Gastrointestinal:Present - Nausea and Vomiting (not helped with Vit B6 and Doxylamine). Not Present- Abdominal Pain, Constipation. Female Genitourinary:Urinary discomfort (has been ruled out for UTI, no improvement with symptoms with Urogesic blue or cranberry juice).  Not Present- Frequency, Painful Urination, Pelvic Pain, Vaginal Bleeding, Vaginal Discharge, Contractions, regular, Fetal Movements Decreased, Urinary Complaints and Vaginal Fluid. Musculoskeletal:Not Present- Back Pain and Leg Cramps. Neurological:Not Present- Dizziness. Psychiatric:Not Present- Depression.  Present - Mood changes (anger)   Objective:   Blood pressure 124/80, pulse (!) 101, weight 298 lb 14.4 oz (135.6 kg), last  menstrual period 04/18/2016.  Body mass index is 49.74 kg/m.  General Appearance:    Alert, cooperative, no distress, appears stated age, morbid obesity  Head:    Normocephalic, without obvious abnormality, atraumatic  Eyes:    PERRL, conjunctiva/corneas clear, EOM's intact, both eyes  Ears:    Normal external ear canals, both ears  Nose:   Nares normal, septum midline, mucosa normal, no drainage or sinus tenderness  Throat:   Lips, mucosa, and tongue normal; teeth and gums normal  Neck:   Supple, symmetrical, trachea midline, no adenopathy;  thyroid: no enlargement/tenderness/nodules; no carotid bruit or JVD  Back:     Symmetric, no curvature, ROM normal, no CVA tenderness  Lungs:     Clear to auscultation bilaterally, respirations unlabored  Chest Wall:    No tenderness or deformity   Heart:    Regular rate and rhythm, S1 and S2 normal, no murmur, rub or gallop  Breast Exam:    No tenderness, masses, or nipple abnormality  Abdomen:     Soft, non-tender, bowel sounds active all four quadrants, no masses, no organomegaly.  FHT 158 bpm.  Genitalia:    Pelvic:external genitalia normal, vagina without lesions, discharge, or tenderness, rectovaginal septum  normal. Cervix normal in appearance, no cervical motion tenderness, no adnexal masses or tenderness.  Unable to determine uterine size due to body habitus.  Uterus mobile, nontender.   Rectal:    Normal external sphincter.  No hemorrhoids appreciated. Internal exam not done.   Extremities:   Extremities normal, atraumatic, no cyanosis or edema  Pulses:   2+ and symmetric all extremities  Skin:   Skin color, texture, turgor normal, no rashes or lesions  Lymph nodes:   Cervical, supraclavicular, and axillary nodes normal  Neurologic:   CNII-XII intact, normal strength, sensation and reflexes throughout     Assessment:    Pregnancy at 10 and 3/7 weeks   Morbid obesity Nausea/vomiting of pregnancy Urinary discomfort H/o anxiety and depression Cats in the home  Plan:   1. Pregnancy at 10 and 3/7 weeks  - Initial labs reviewed. - Prenatal vitamins encouraged. - Problem list reviewed and updated. - New OB counseling:  The patient has been given an overview regarding routine prenatal care.   - Prenatal testing, optional genetic testing, and ultrasound use in pregnancy were reviewed.  AFP3 and cell-free DNA testing discussed: requested cell-free DNA testing with MaterniT21, ordered. - Benefits of Breast Feeding were discussed. The patient is encouraged to consider nursing her baby  post partum.  2. Morbid obesity - Recommendations regarding diet, weight gain, and exercise in pregnancy were given. Patient should aim to gain no more than 10-15 lbs this pregnancy.  - Will need early glucola for diabetes screen, to perform by next visit.  - Referral to Anesthesia to assess if patient needs to be further managed by a tertiary care facility for the remainder of her pregnancy.   3. Nausea/vomiting of pregnancy - Notes Vitamin B6 and Doxylamine not helping. Still able to tolerate some foods and fluids.  Will prescribe Phenergan.   4. Urinary discomfort - Patient has been ruled out several times for a UTI. Has been drinking cranberry juice and using Urogesic blue without much relief.  May be secondary to pregnancy itself.  If patient continues to note issues, may benefit from referral to Urology.   5. H/o anxiety and depression - Previously on Abilify and Zoloft but discontinued at discovery of pregnancy.  Has recently resumed Zoloft due  noticing a resurfacing of her symptoms.  Discussed the need for weaning from Zoloft in 3rd trimester if possible in order to decrease risk of neonatal respiratory depression syndrome at birth.  Patient notes understanding. Also recommend referral for counseling.  Patient desires to think about it.    6. Cats in the home - Negative Toxoplasmosis screen.  Continued to encourage refraining from changing litter boxes, avoiding cat feces.   Follow up in 4 weeks.   50% of 30 min visit spent on counseling and coordination of care.     Hildred LaserAnika Sherronda Sweigert, MD [facility]

## 2016-07-05 NOTE — Patient Instructions (Signed)
Include 3 meals per day that include a protein food, carbohydrates (starch, fruit, yogurt) and non-starchy vegetable Include a protein food with each meal. Refer to list. Ex.Add grated cheese to grits for breakfast. Add a piece of fruit. Try peanut butter toast with a pear. If you can't eat breakfast, try [employer] Instant Breakfast added to milk.  Make or buy chicken salad since eating meat cold often does not cause as much nausea. Steam vegetables and add in with the noodles and rice you are eating at lunch and dinner. Eat fruit at least 2 times per day. Bake chicken nuggets or fish sticks for protein to help prevent nausea from working with raw chicken.

## 2016-07-06 ENCOUNTER — Encounter: Payer: Self-pay | Admitting: Obstetrics and Gynecology

## 2016-07-08 DIAGNOSIS — O0991 Supervision of high risk pregnancy, unspecified, first trimester: Secondary | ICD-10-CM | POA: Insufficient documentation

## 2016-07-08 DIAGNOSIS — F419 Anxiety disorder, unspecified: Secondary | ICD-10-CM

## 2016-07-08 DIAGNOSIS — R399 Unspecified symptoms and signs involving the genitourinary system: Secondary | ICD-10-CM | POA: Insufficient documentation

## 2016-07-08 DIAGNOSIS — O219 Vomiting of pregnancy, unspecified: Secondary | ICD-10-CM | POA: Insufficient documentation

## 2016-07-08 DIAGNOSIS — F329 Major depressive disorder, single episode, unspecified: Secondary | ICD-10-CM | POA: Insufficient documentation

## 2016-07-09 ENCOUNTER — Encounter: Payer: Self-pay | Admitting: Obstetrics and Gynecology

## 2016-07-16 ENCOUNTER — Encounter: Payer: Self-pay | Admitting: Emergency Medicine

## 2016-07-16 ENCOUNTER — Emergency Department
Admission: EM | Admit: 2016-07-16 | Discharge: 2016-07-17 | Disposition: A | Discharge status: Other Acute Inpatient Hospital | Payer: BLUE CROSS/BLUE SHIELD | Attending: Emergency Medicine | Admitting: Emergency Medicine

## 2016-07-16 DIAGNOSIS — R45851 Suicidal ideations: Secondary | ICD-10-CM

## 2016-07-16 DIAGNOSIS — O99341 Other mental disorders complicating pregnancy, first trimester: Secondary | ICD-10-CM | POA: Insufficient documentation

## 2016-07-16 DIAGNOSIS — F332 Major depressive disorder, recurrent severe without psychotic features: Secondary | ICD-10-CM | POA: Diagnosis not present

## 2016-07-16 DIAGNOSIS — Z3A12 12 weeks gestation of pregnancy: Secondary | ICD-10-CM | POA: Diagnosis not present

## 2016-07-16 DIAGNOSIS — Z79899 Other long term (current) drug therapy: Secondary | ICD-10-CM | POA: Insufficient documentation

## 2016-07-16 DIAGNOSIS — Z3490 Encounter for supervision of normal pregnancy, unspecified, unspecified trimester: Secondary | ICD-10-CM

## 2016-07-16 LAB — ACETAMINOPHEN LEVEL: Acetaminophen (Tylenol), Serum: <10 ug/mL — ABNORMAL LOW (ref 10–30)

## 2016-07-16 LAB — COMPREHENSIVE METABOLIC PANEL
ALT: 15 U/L (ref 14–54)
AST: 21 U/L (ref 15–41)
Albumin: 3.7 g/dL (ref 3.5–5.0)
Alkaline Phosphatase: 51 U/L (ref 38–126)
Anion gap: 9 (ref 5–15)
BUN: 6 mg/dL (ref 6–20)
CHLORIDE: 104 mmol/L (ref 101–111)
CO2: 23 mmol/L (ref 22–32)
Calcium: 9.1 mg/dL (ref 8.9–10.3)
Creatinine, Ser: 0.48 mg/dL (ref 0.44–1.00)
GFR calc non Af Amer: >60 mL/min (ref >60)
Glucose, Bld: 89 mg/dL (ref 65–99)
POTASSIUM: 3.6 mmol/L (ref 3.5–5.1)
Sodium: 136 mmol/L (ref 135–145)
Total Bilirubin: 0.3 mg/dL (ref 0.3–1.2)
Total Protein: 7.9 g/dL (ref 6.5–8.1)

## 2016-07-16 LAB — URINE DRUG SCREEN, QUALITATIVE (ARMC ONLY)
AMPHETAMINES, UR SCREEN: NOT DETECTED
Barbiturates, Ur Screen: NOT DETECTED
Benzodiazepine, Ur Scrn: NOT DETECTED
Cannabinoid 50 Ng, Ur ~~LOC~~: NOT DETECTED
Cocaine Metabolite,Ur ~~LOC~~: NOT DETECTED
MDMA (Ecstasy)Ur Screen: NOT DETECTED
Methadone Scn, Ur: NOT DETECTED
Opiate, Ur Screen: NOT DETECTED
Phencyclidine (PCP) Ur S: NOT DETECTED
TRICYCLIC, UR SCREEN: NOT DETECTED

## 2016-07-16 LAB — ETHANOL: Alcohol, Ethyl (B): <5 mg/dL (ref <5)

## 2016-07-16 LAB — CBC
HCT: 36.2 % (ref 35.0–47.0)
Hemoglobin: 12.5 g/dL (ref 12.0–16.0)
MCH: 30.5 pg (ref 26.0–34.0)
MCHC: 34.6 g/dL (ref 32.0–36.0)
MCV: 88.1 fL (ref 80.0–100.0)
Platelets: 327 10*3/uL (ref 150–440)
RBC: 4.11 MIL/uL (ref 3.80–5.20)
RDW: 13.3 % (ref 11.5–14.5)
WBC: 10.7 10*3/uL (ref 3.6–11.0)

## 2016-07-16 LAB — SALICYLATE LEVEL

## 2016-07-16 MED ORDER — PRENATAL MULTIVITAMIN CH
1.0000 | ORAL_TABLET | Freq: Every day | ORAL | Status: DC
  Filled 2016-07-16: qty 1

## 2016-07-16 MED ORDER — COMPLETENATE 29-1 MG PO CHEW: 1.0000 | CHEWABLE_TABLET | Freq: Every day | ORAL | Status: DC

## 2016-07-16 MED ORDER — PRENATAL LOW IRON 27-1 MG PO TABS
1.0000 | ORAL_TABLET | Freq: Every day | ORAL | Status: DC
  Administered 2016-07-16 – 2016-07-17 (×2): 1 via ORAL
  Filled 2016-07-16 (×3): qty 1

## 2016-07-16 NOTE — ED Notes (Addendum)
Anxious and depressed, in conversation tearful. States she feels anxious related to the unknown future r/t her baby, the position of her boyfriend regarding the baby and not being able to take the medication she knows will help (abilify). Support and encouragement provided. Discussed the need for INPT to determine what is best for her and her baby. Discussed that her treatment team on the inpt unit will be open to her concerns and able to address them (if not with Abilify, maybe an alternative. Points to her family as her support system, encouraged her to draw on their help while she moves thru the pregnancy. Encouraged her to rest at frequent intervals and maintain her food/fluid intake. Currently denies SI/HI/AVH and contracts for safety. When discussing her situation with RN from previous shift, she intimated that she was thinking about terminating the pregnancy. However, she denies that currently and is talking about the future and plans to prepare for the babies arrival. Pension scheme managerVerbally contracts for safety. Denies pain.Offered no questions or concerns otherwise. Safety has been maintained with q15 minute obs and ODS obs. Will continue to monitor for safety.

## 2016-07-16 NOTE — BH Assessment (Addendum)
Tele Assessment Note   Latasha Alexander is an 26 y.o. female who came to the ED after telling her psychiatrist that she has been having suicidal thoughts for the past 2 weeks and is thinking about terminating her pregnancy to get back on all her psychiatric medications. She stopped taking her zoloft, trazadone and abilify when she found out she was pregnant at [redacted] weeks and just started taking her zoloft again at [redacted] weeks pregnant (she is now [redacted] weeks pregnant). She states that her depression is getting worse and her intrusive and obsessive thinking is coming back as well. She stated to staff when she came in that when "she sees a bottle of pills or a knife she thinks about killing herself". She states that she is being treated by a therapist and psychiatrist at [facility]. She says that she feels like she isn't "stable enough to have a child" and wants to terminate the pregnancy and get back on her medication. She states that the father is not supportive and denies that the baby is his. She states that she lives with her mom and dad and her mom is supportive of her. Pt has been admitted inpatient once before at [facility] around 5 years ago. She denies HI or AVH or any history of this. She denies substance abuse or any history of this as well. She denies any plan as to how she would kill herself.   Pt to be seen by provider for final disposition.   Diagnosis: Major Depressive Disorder Recurrent severe, OCD (per history) GAD (per history)  Past Medical History:  Past Medical History:  Diagnosis Date  . Anxiety   . Depression   . OCD (obsessive compulsive disorder)   . Panic attacks     Past Surgical History:  Procedure Laterality Date  . NO PAST SURGERIES    . none      Family History:  Family History  Problem Relation Age of Onset  . Diabetes Mother   . Hypertension Mother   . Hypertension Sister   . Stroke Maternal Grandfather     Social History:  reports that  she has never smoked. She has never used smokeless tobacco. She reports that she does not drink alcohol or use drugs.  Additional Social History:  Alcohol / Drug Use History of alcohol / drug use?: No history of alcohol / drug abuse  CIWA: CIWA-Ar BP: 139/73 Pulse Rate: 83 COWS:    PATIENT STRENGTHS: (choose at least two) Average or above average intelligence General fund of knowledge Motivation for treatment/growth  Allergies: No Known Allergies  Home Medications:  (Not in a hospital admission)  OB/GYN Status:  Patient's last menstrual period was 04/18/2016 (exact date).  General Assessment Data Location of Assessment: [facility] ED TTS Assessment: In system Is this a Tele or Face-to-Face Assessment?: Tele Assessment Is this an Initial Assessment or a Re-assessment for this encounter?: Initial Assessment Marital status: Single Is patient pregnant?: Yes Pregnancy Status: Yes (Comment: include estimated delivery date) Living Arrangements: Parent Can pt return to current living arrangement?: Yes Admission Status: Voluntary Is patient capable of signing voluntary admission?: Yes Referral Source: Self/Family/Friend Insurance type: BCBS     Crisis Care Plan Living Arrangements: Parent Name of Psychiatrist: Dr[facility] Lesle Chris at [facility] Name of Therapist: Lyla Son at [facility]  Education Status Is patient currently in school?: No Highest grade of school patient has completed: GED  Risk to self with the past 6 months Suicidal Ideation: Yes-Currently  Present Has patient been a risk to self within the past 6 months prior to admission? : Yes Suicidal Intent: No Has patient had any suicidal intent within the past 6 months prior to admission? : No Is patient at risk for suicide?: Yes Suicidal Plan?: No Has patient had any suicidal plan within the past 6 months prior to admission? : No Access to Means: No What has been your use of drugs/alcohol within the last 12 months?: None   Previous Attempts/Gestures: Yes How many times?: 1 Triggers for Past Attempts: Unknown Intentional Self Injurious Behavior: None Family Suicide History: Unknown Recent stressful life event(s): Other (Comment) (pregnancy) Persecutory voices/beliefs?: No Depression: Yes Depression Symptoms: Despondent, Loss of interest in usual pleasures, Feeling worthless/self pity Substance abuse history and/or treatment for substance abuse?: No Suicide prevention information given to non-admitted patients: Not applicable  Risk to Others within the past 6 months Homicidal Ideation: No Does patient have any lifetime risk of violence toward others beyond the six months prior to admission? : No Thoughts of Harm to Others: No Current Homicidal Intent: No Current Homicidal Plan: No Access to Homicidal Means: No Identified Victim: None History of harm to others?: No Assessment of Violence: None Noted Violent Behavior Description: None Does patient have access to weapons?: No Criminal Charges Pending?: No Does patient have a court date: No Is patient on probation?: No  Psychosis Hallucinations: None noted Delusions: None noted  Mental Status Report Appearance/Hygiene: Unremarkable Eye Contact: Good Motor Activity: Freedom of movement Speech: Logical/coherent Level of Consciousness: Alert Mood: Depressed Affect: Depressed Anxiety Level: Moderate Thought Processes: Coherent Judgement: Partial Orientation: Person, Place, Time, Situation Obsessive Compulsive Thoughts/Behaviors: Severe (intrusive thinking )  Cognitive Functioning Concentration: Decreased Memory: Recent Intact, Remote Intact IQ: Average Insight: Fair Impulse Control: Fair Appetite: Fair Sleep: Unable to Assess Vegetative Symptoms: Unable to Assess  ADLScreening [facility]) Patient's cognitive ability adequate to safely complete daily activities?: Yes Patient able to express need for assistance with ADLs?:  Yes Independently performs ADLs?: Yes (appropriate for developmental age)  Prior Inpatient Therapy Prior Inpatient Therapy: Yes Prior Therapy Dates: 2013 Prior Therapy Facilty/Provider(s): [facility] Reason for Treatment: Depression OCD  Prior Outpatient Therapy Prior Outpatient Therapy: Yes Prior Therapy Dates: Current Prior Therapy Facilty/Provider(s): [facility] Reason for Treatment: Depression, OCD Does patient have an ACCT team?: No Does patient have Intensive In-House Services?  : No Does patient have [facility] services? : No Does patient have P4CC services?: No  ADL Screening (condition at time of admission) Patient's cognitive ability adequate to safely complete daily activities?: Yes Is the patient deaf or have difficulty hearing?: No Does the patient have difficulty seeing, even when wearing glasses/contacts?: No Does the patient have difficulty concentrating, remembering, or making decisions?: No Patient able to express need for assistance with ADLs?: Yes Does the patient have difficulty dressing or bathing?: No Independently performs ADLs?: Yes (appropriate for developmental age) Does the patient have difficulty walking or climbing stairs?: No Weakness of Legs: None Weakness of Arms/Hands: None  Home Assistive Devices/Equipment Home Assistive Devices/Equipment: None  Therapy Consults (therapy consults require a physician order) PT Evaluation Needed: No OT Evalulation Needed: No SLP Evaluation Needed: No Abuse/Neglect Assessment (Assessment to be complete while patient is alone) Physical Abuse: Denies Verbal Abuse: Denies Sexual Abuse: Denies Exploitation of patient/patient's resources: Denies Self-Neglect: Denies Values / Beliefs Cultural Requests During Hospitalization: None Spiritual Requests During Hospitalization: None Consults Spiritual Care Consult Needed: No Social Work Consult Needed: No Merchant navy officer (For Healthcare) Does  Patient Have a Medical  Advance Directive?: No Would patient like information on creating a medical advance directive?: Yes (ED - Information included in AVS) Nutrition Screen- [facility] Adult/[facility]/AP Patient's home diet: Regular Has the patient recently lost weight without trying?: No Has the patient been eating poorly because of a decreased appetite?: No Malnutrition Screening Tool Score: 0  Additional Information 1:1 In Past 12 Months?: No CIRT Risk: No Elopement Risk: No Does patient have medical clearance?: Yes     Disposition:  Disposition Initial Assessment Completed for this Encounter: Yes Type of inpatient treatment program: Adult  Latasha Alexander 07/16/2016 3:48 PM

## 2016-07-16 NOTE — ED Notes (Signed)
[facility] Pt dressed in purple scrubs and wanded. Pt given meal and fluids. Pt calm, cooperative and pleasant.

## 2016-07-16 NOTE — ED Notes (Signed)
Two tiger top labs drawn. Requested by lab.

## 2016-07-16 NOTE — ED Notes (Signed)
PT  IVC  SEEN  BY  DR  CLAPACS  PENDING  PLACEMENT 

## 2016-07-16 NOTE — ED Notes (Signed)
Lab requesting 2 SST sent to lab due to work exposure. Pt gave consent for collection.

## 2016-07-16 NOTE — ED Triage Notes (Signed)
C/O having suicidal thoughts x 10-14 days.  Patient has history of OCD and has been abilify, zoloft, trazadone.  Patient is currently [redacted] weeks pregnant and had been taken off medications for about 3 weeks, taken off by psychiatrist.  Rhina BrackettSent to ED today by psychiatrist.  [facility].  Patient denies plan or attempts, and states she is just feeling sad.

## 2016-07-16 NOTE — ED Notes (Signed)
TTS computer at bedside 

## 2016-07-16 NOTE — Consult Note (Signed)
[facility] Face-to-Face Psychiatry Consult   Reason for Consult:  Consult 26 year old woman who presents with acute depression and suicidal ideation Referring Physician:  Mariea Clonts Patient Identification: Latasha Alexander MRN:  [id number] Principal Diagnosis: Severe recurrent major depression without psychotic features [facility]) Diagnosis:   Patient Active Problem List   Diagnosis Date Noted  . Severe recurrent major depression without psychotic features ([place]) [F33.2] 07/16/2016  . Pregnant [Z34.90] 07/16/2016  . Anxiety and depression [F41.8] 07/08/2016  . Nausea and vomiting during pregnancy [O21.9] 07/08/2016  . Urinary symptom or sign [R39.9] 07/08/2016  . Supervision of high risk pregnancy in first trimester [O09.91] 07/08/2016  . Morbid obesity with BMI of 45.0-49.9, adult ([place]) [E66.01, Z68.42] 06/06/2016    Total Time spent with patient: 1 hour  Subjective:   Latasha Alexander is a 26 y.o. female patient admitted with "I'm having intense suicidal thoughts".  HPI:  Patient interviewed. Chart reviewed. Labs reviewed. 26 year old woman presents to the emergency room saying she has intense suicidal thoughts. They have been going on for about 2 weeks and are getting worse. She feels hopeless and negative all of the time. She has been crying frequently. Denies having any hallucinations. Denies any other psychotic symptoms. She is sleeping okay. Patient is currently [redacted] weeks pregnant. She found out she was pregnant about 7 weeks ago and at that time discontinued all of her psychiatric medicine. She felt okay at first but now the depression has come back. Her psychiatrist had her restart Zoloft 50 mg a day but not the rest of her medicine. Not drinking not using drugs. Patient has started to have thoughts about terminating the pregnancy which she was not having before the depression. No thoughts however hurting anyone else. She doesn't feel like she has an excessive amount of stress in her life  otherwise.  Medical history: Overweight. Otherwise has what appears to be a normal intrauterine pregnancy at 12 weeks no other medical information.  Substance abuse history: Not drinking or using drugs. No past history of alcohol or drug abuse.  Social history: Lives with her parents. Says that she has a good relationship with him and they treat her well. Not currently working. The father of the child is not involved in her life anymore which is upsetting to her but doesn't seem to be overwhelming for her.  Past Psychiatric History: Patient was admitted to the hospital here about 4 years ago with major depression after a history of a childhood diagnosis of anxiety disorder. Patient says that was her only psychiatric admission. No history of suicide attempts. She was taking Zoloft 200 mg per day, Abilify 10 mg a day and trazodone 200 mg at night and seeing Dr. Zollie Scale at [facility]. She says she remembers the Abilify being particularly helpful for her.  Risk to Self: Suicidal Ideation: Yes-Currently Present Suicidal Intent: No Is patient at risk for suicide?: Yes Suicidal Plan?: No Access to Means: No What has been your use of drugs/alcohol within the last 12 months?: None  How many times?: 1 Triggers for Past Attempts: Unknown Intentional Self Injurious Behavior: None Risk to Others: Homicidal Ideation: No Thoughts of Harm to Others: No Current Homicidal Intent: No Current Homicidal Plan: No Access to Homicidal Means: No Identified Victim: None History of harm to others?: No Assessment of Violence: None Noted Violent Behavior Description: None Does patient have access to weapons?: No Criminal Charges Pending?: No Does patient have a court date: No Prior Inpatient Therapy: Prior Inpatient Therapy: Yes Prior Therapy  Dates: 2013 Prior Therapy Facilty/Provider(s): [facility] Reason for Treatment: Depression OCD Prior Outpatient Therapy: Prior Outpatient Therapy: Yes Prior Therapy Dates:  Current Prior Therapy Facilty/Provider(s): [facility] Reason for Treatment: Depression, OCD Does patient have an ACCT team?: No Does patient have Intensive In-House Services?  : No Does patient have [facility] services? : No Does patient have P4CC services?: No  Past Medical History:  Past Medical History:  Diagnosis Date  . Anxiety   . Depression   . OCD (obsessive compulsive disorder)   . Panic attacks     Past Surgical History:  Procedure Laterality Date  . NO PAST SURGERIES    . none     Family History:  Family History  Problem Relation Age of Onset  . Diabetes Mother   . Hypertension Mother   . Hypertension Sister   . Stroke Maternal Grandfather    Family Psychiatric  History: Mother has anxiety problems. Had a distant uncle who may have had depression. No other family history. Social History:  History  Alcohol Use No     History  Drug Use No    Social History   Social History  . Marital status: Single    Spouse name: N/A  . Number of children: N/A  . Years of education: N/A   Social History Main Topics  . Smoking status: Never Smoker  . Smokeless tobacco: Never Used  . Alcohol use No  . Drug use: No  . Sexual activity: Yes    Partners: Male    Birth control/ protection: None     Comment: nexplanon taken out about 2 months before she was pregnant   Other Topics Concern  . None   Social History Narrative  . None   Additional Social History:    Allergies:  No Known Allergies  Labs:  Results for orders placed or performed during the hospital encounter of 07/16/16 (from the past 48 hour(s))  Comprehensive metabolic panel     Status: None   Collection Time: 07/16/16 11:41 AM  Result Value Ref Range   Sodium 136 135 - 145 mmol/L   Potassium 3.6 3.5 - 5.1 mmol/L   Chloride 104 101 - 111 mmol/L   CO2 23 22 - 32 mmol/L   Glucose, Bld 89 65 - 99 mg/dL   BUN 6 6 - 20 mg/dL   Creatinine, Ser 0.48 0.44 - 1.00 mg/dL   Calcium 9.1 8.9 - 10.3 mg/dL    Total Protein 7.9 6.5 - 8.1 g/dL   Albumin 3.7 3.5 - 5.0 g/dL   AST 21 15 - 41 U/L   ALT 15 14 - 54 U/L   Alkaline Phosphatase 51 38 - 126 U/L   Total Bilirubin 0.3 0.3 - 1.2 mg/dL   GFR calc non Af Amer >60 >60 mL/min   GFR calc Af Amer >60 >60 mL/min    Comment: (NOTE) The eGFR has been calculated using the CKD EPI equation. This calculation has not been validated in all clinical situations. eGFR's persistently <60 mL/min signify possible Chronic Kidney Disease.    Anion gap 9 5 - 15  Ethanol     Status: None   Collection Time: 07/16/16 11:41 AM  Result Value Ref Range   Alcohol, Ethyl (B) <5 <5 mg/dL    Comment:        LOWEST DETECTABLE LIMIT FOR SERUM ALCOHOL IS 5 mg/dL FOR MEDICAL PURPOSES ONLY   Salicylate level     Status: None   Collection Time: 07/16/16 11:41  AM  Result Value Ref Range   Salicylate Lvl <6.7 2.8 - 30.0 mg/dL  Acetaminophen level     Status: Abnormal   Collection Time: 07/16/16 11:41 AM  Result Value Ref Range   Acetaminophen (Tylenol), Serum <10 (L) 10 - 30 ug/mL    Comment:        THERAPEUTIC CONCENTRATIONS VARY SIGNIFICANTLY. A RANGE OF 10-30 ug/mL MAY BE AN EFFECTIVE CONCENTRATION FOR MANY PATIENTS. HOWEVER, SOME ARE BEST TREATED AT CONCENTRATIONS OUTSIDE THIS RANGE. ACETAMINOPHEN CONCENTRATIONS >150 ug/mL AT 4 HOURS AFTER INGESTION AND >50 ug/mL AT 12 HOURS AFTER INGESTION ARE OFTEN ASSOCIATED WITH TOXIC REACTIONS.   cbc     Status: None   Collection Time: 07/16/16 11:41 AM  Result Value Ref Range   WBC 10.7 3.6 - 11.0 K/uL   RBC 4.11 3.80 - 5.20 MIL/uL   Hemoglobin 12.5 12.0 - 16.0 g/dL   HCT 36.2 35.0 - 47.0 %   MCV 88.1 80.0 - 100.0 fL   MCH 30.5 26.0 - 34.0 pg   MCHC 34.6 32.0 - 36.0 g/dL   RDW 13.3 11.5 - 14.5 %   Platelets 327 150 - 440 K/uL  Urine Drug Screen, Qualitative     Status: None   Collection Time: 07/16/16 11:41 AM  Result Value Ref Range   Tricyclic, Ur Screen NONE DETECTED NONE DETECTED   Amphetamines, Ur  Screen NONE DETECTED NONE DETECTED   MDMA (Ecstasy)Ur Screen NONE DETECTED NONE DETECTED   Cocaine Metabolite,Ur [place] NONE DETECTED NONE DETECTED   Opiate, Ur Screen NONE DETECTED NONE DETECTED   Phencyclidine (PCP) Ur S NONE DETECTED NONE DETECTED   Cannabinoid 50 Ng, Ur [place] NONE DETECTED NONE DETECTED   Barbiturates, Ur Screen NONE DETECTED NONE DETECTED   Benzodiazepine, Ur Scrn NONE DETECTED NONE DETECTED   Methadone Scn, Ur NONE DETECTED NONE DETECTED    Comment: (NOTE) 619  Tricyclics, urine               Cutoff 1000 ng/mL 200  Amphetamines, urine             Cutoff 1000 ng/mL 300  MDMA (Ecstasy), urine           Cutoff 500 ng/mL 400  Cocaine Metabolite, urine       Cutoff 300 ng/mL 500  Opiate, urine                   Cutoff 300 ng/mL 600  Phencyclidine (PCP), urine      Cutoff 25 ng/mL 700  Cannabinoid, urine              Cutoff 50 ng/mL 800  Barbiturates, urine             Cutoff 200 ng/mL 900  Benzodiazepine, urine           Cutoff 200 ng/mL 1000 Methadone, urine                Cutoff 300 ng/mL 1100 1200 The urine drug screen provides only a preliminary, unconfirmed 1300 analytical test result and should not be used for non-medical 1400 purposes. Clinical consideration and professional judgment should 1500 be applied to any positive drug screen result due to possible 1600 interfering substances. A more specific alternate chemical method 1700 must be used in order to obtain a confirmed analytical result.  1800 Gas chromato graphy / mass spectrometry (GC/MS) is the preferred 1900 confirmatory method.     Current Facility-Administered Medications  Medication Dose Route Frequency  Provider Last Rate Last Dose  . PRENATAL LOW IRON 27-1 MG TABS 1 tablet  1 tablet Oral Daily Eula Listen, MD   1 tablet at 07/16/16 1555   Current Outpatient Prescriptions  Medication Sig Dispense Refill  . Prenatal Vit-Fe Fumarate-FA (MULTIVITAMIN-PRENATAL) 27-0.8 MG TABS tablet Take 1  tablet by mouth daily at 12 noon.    . promethazine (PHENERGAN) 12.5 MG tablet Take 1 tablet (12.5 mg total) by mouth every 6 (six) hours as needed for nausea or vomiting. 30 tablet 2  . sertraline (ZOLOFT) 100 MG tablet Take 100 mg by mouth daily.      Musculoskeletal: Strength & Muscle Tone: within normal limits Gait & Station: normal Patient leans: N/A  Psychiatric Specialty Exam: Physical Exam  Nursing note and vitals reviewed. Constitutional: She appears well-developed and well-nourished.  HENT:  Head: Normocephalic and atraumatic.  Eyes: Conjunctivae are normal. Pupils are equal, round, and reactive to light.  Neck: Normal range of motion.  Cardiovascular: Normal heart sounds.   Respiratory: Effort normal.  GI: Soft.    Musculoskeletal: Normal range of motion.  Neurological: She is alert.  Skin: Skin is warm and dry.  Psychiatric: Her behavior is normal. Judgment normal. Her mood appears anxious. Her affect is labile. Her speech is delayed. Cognition and memory are normal. She exhibits a depressed mood. She expresses suicidal ideation.    Review of Systems  Constitutional: Negative.   HENT: Negative.   Eyes: Negative.   Respiratory: Negative.   Cardiovascular: Negative.   Gastrointestinal: Negative.   Musculoskeletal: Negative.   Skin: Negative.   Neurological: Negative.   Psychiatric/Behavioral: Positive for depression and suicidal ideas. Negative for hallucinations, memory loss and substance abuse. The patient is nervous/anxious. The patient does not have insomnia.     Blood pressure 139/73, pulse 83, temperature 98 F (36.7 C), temperature source Oral, resp. rate 16, height '5\' 5"'$  (1.651 m), weight 135.2 kg (298 lb), last menstrual period 04/18/2016, SpO2 99 %.Body mass index is 49.59 kg/m.  General Appearance: Fairly Groomed  Eye Contact:  Fair  Speech:  Slow and Patient has a speech impediment which appears to be simply a baseline thing not a result of any acute  condition. Still very easy to understand her.  Volume:  Normal  Mood:  Depressed and Dysphoric  Affect:  Depressed and Tearful  Thought Process:  Goal Directed  Orientation:  Full (Time, Place, and Person)  Thought Content:  Rumination  Suicidal Thoughts:  Yes.  with intent/plan  Homicidal Thoughts:  No  Memory:  Immediate;   Good Recent;   Fair Remote;   Fair  Judgement:  Fair  Insight:  Fair  Psychomotor Activity:  Normal  Concentration:  Concentration: Fair  Recall:  [employer] of Knowledge:  Fair  Language:  Fair  Akathisia:  No  Handed:  Right  AIMS (if indicated):     Assets:  Communication Skills Desire for Improvement Housing Physical Health Resilience Social Support  ADL's:  Intact  Cognition:  WNL  Sleep:        Treatment Plan Summary: Daily contact with patient to assess and evaluate symptoms and progress in treatment, Medication management and Plan 26 year old woman with severe major depression. [redacted] weeks pregnant but otherwise in good health. Patient is very much wanting treatment. I reassured her that there are ways to treat her depression even while continuing her pregnancy if that is what she wants. She will be admitted to the psychiatry ward. I will increase  her Zoloft back to 100 mg at night and let the treatment team downstairs talk with her about what she feels comfortable with and what they feel would be safe to restart as far as depression treatment.  Disposition: Recommend psychiatric Inpatient admission when medically cleared. Supportive therapy provided about ongoing stressors.  Alethia Berthold, MD 07/16/2016 4:22 PM

## 2016-07-16 NOTE — ED Notes (Signed)
Pt given lunch tray with juice. Pt tearful

## 2016-07-16 NOTE — ED Provider Notes (Addendum)
[facility] Emergency Department Provider Note  ____________________________________________  Time seen: Approximately 2:05 PM  I have reviewed the triage vital signs and the nursing notes.   HISTORY  Chief Complaint Suicidal    HPI Latasha Alexander is a 26 y.o. female approximately [redacted] weeks pregnant, with a history of OCD, depression, and anxiety, off of all her medications for her pregnancy, presenting with suicidal ideations. The patient reports "every time I go by a knife, or pills, I think about killing myself." She also reports a sensation of "being out of my body." "I am thinking about terminating the pregnancy because it is the only way I will get better. I need to get back on my meds." Patient denies any vaginal bleeding or medical complaints at this time.   Past Medical History:  Diagnosis Date  . Anxiety   . Depression   . OCD (obsessive compulsive disorder)   . Panic attacks     Patient Active Problem List   Diagnosis Date Noted  . Anxiety and depression 07/08/2016  . Nausea and vomiting during pregnancy 07/08/2016  . Urinary symptom or sign 07/08/2016  . Supervision of high risk pregnancy in first trimester 07/08/2016  . Morbid obesity with BMI of 45.0-49.9, adult ([facility]) 06/06/2016    Past Surgical History:  Procedure Laterality Date  . NO PAST SURGERIES    . none      Current Outpatient Rx  . Order #: [id number] Class: Historical Med  . Order #: [id number] Class: Normal  . Order #: [id number] Class: Historical Med    Allergies Patient has no known allergies.  Family History  Problem Relation Age of Onset  . Diabetes Mother   . Hypertension Mother   . Hypertension Sister   . Stroke Maternal Grandfather     Social History Social History  Substance Use Topics  . Smoking status: Never Smoker  . Smokeless tobacco: Never Used  . Alcohol use No    Review of Systems Constitutional: No fever/chills.Lightheadedness or  syncope. Eyes: No visual changes. ENT: No sore throat. No congestion or rhinorrhea. Cardiovascular: Denies chest pain. Denies palpitations. Respiratory: Denies shortness of breath.  No cough. Gastrointestinal: No abdominal pain.  No nausea, no vomiting.  No diarrhea.  No constipation. Genitourinary: Negative for dysuria. No vaginal bleeding. Musculoskeletal: Negative for back pain. Skin: Negative for rash. Neurological: Negative for headaches. No focal numbness, tingling or weakness.  Psychiatric:Positive suicidal ideations. Negative homicidal ideations. No hallucinations.  10-point ROS otherwise negative.  ____________________________________________   PHYSICAL EXAM:  VITAL SIGNS: ED Triage Vitals  Enc Vitals Group     BP 07/16/16 1136 (!) 143/73     Pulse --      Resp 07/16/16 1136 16     Temp 07/16/16 1136 98 F (36.7 C)     Temp Source 07/16/16 1136 Oral     SpO2 07/16/16 1136 100 %     Weight 07/16/16 1139 298 lb (135.2 kg)     Height 07/16/16 1139 5\' 5"  (1.651 m)     Head Circumference --      Peak Flow --      Pain Score 07/16/16 1140 0     Pain Loc --      Pain Edu? --      Excl. in GC? --     Constitutional: Alert and oriented. Well appearing and in no acute distress. Answers questions appropriately. Eyes: Conjunctivae are normal.  EOMI. No scleral icterus. Head: Atraumatic. Nose: No congestion/rhinnorhea. Mouth/Throat: Mucous  membranes are moist.  Neck: No stridor.  Supple.  No meningismus. Cardiovascular: Normal rate, regular rhythm. No murmurs, rubs or gallops.  Respiratory: Normal respiratory effort.  No accessory muscle use or retractions. Lungs CTAB.  No wheezes, rales or ronchi. Gastrointestinal: Obese. Soft, nontender and nondistended.  No guarding or rebound.  No peritoneal signs. Genitourinary: Not indicated Musculoskeletal: No LE edema.  Neurologic:  A&Ox3.  Speech is clear.  Face and smile are symmetric.  EOMI.  Moves all extremities  well. Skin:  Skin is warm, dry and intact. No rash noted. Psychiatric: Depressed mood and flat affect. Good insight into her medical condition. No pressured speech.  ____________________________________________   LABS (all labs ordered are listed, but only abnormal results are displayed)  Labs Reviewed  ACETAMINOPHEN LEVEL - Abnormal; Notable for the following:       Result Value   Acetaminophen (Tylenol), Serum <10 (*)    All other components within normal limits  COMPREHENSIVE METABOLIC PANEL  ETHANOL  SALICYLATE LEVEL  CBC  URINE DRUG SCREEN, QUALITATIVE ([facility] ONLY)   ____________________________________________  EKG  Not indicated ____________________________________________  RADIOLOGY  No results found.  ____________________________________________   PROCEDURES  Procedure(s) performed: None  Procedures  Critical Care performed: No ____________________________________________   INITIAL IMPRESSION / ASSESSMENT AND PLAN / ED COURSE  Pertinent labs & imaging results that were available during my care of the patient were reviewed by me and considered in my medical decision making (see chart for details).  26 y.o. female [redacted] weeks pregnant off her medications here with SI.  Overall, the patient is hemodynamically stable. Her blood pressure is 143/73 and we will repeat that given her pregnant state. We'll get fetal heart tones. Once her medical workup is complete, she will be cleared for psychiatric disposition. I have put the patient under involuntary commitment and recommended inpatient hospitalization.  ----------------------------------------- 4:05 PM on 07/16/2016 -----------------------------------------  The patient has normal fetal heart tones, and is medically cleared for psychiatric disposition at this time.  She has been signed out to Dr. Lenard LancePaduchowski for further evaluation and treatment.  ____________________________________________  FINAL CLINICAL  IMPRESSION(S) / ED DIAGNOSES  Final diagnoses:  Suicidal ideation         NEW MEDICATIONS STARTED DURING THIS VISIT:  New Prescriptions   No medications on file      Rockne MenghiniAnne-Caroline Krisinda Giovanni, MD 07/16/16 1413    Rockne MenghiniAnne-Caroline Deegan Valentino, MD 07/16/16 1605

## 2016-07-16 NOTE — ED Notes (Addendum)
Patient didn't eat her hamburger for dinner- reports that she has had an aversion to meat since being pregnant. When offered something else she declined saying that she wasn't hungry. Pt became sad and teary when talking about her pregnancy. Pt reports that she doesn't feel emotionally stable to continue on in the pregnancy feeling the way she does- having suicidal thoughts. She reports she has considered terminating the pregnancy but voices concern that she would feel regret if she did. Pt reports that she believes the suicidal thoughts started with the increase in zoloft from 50 to 100 mg. PO fluids provided. Safety maintained with 15 minute checks

## 2016-07-16 NOTE — [facility] Counselor (Signed)
Per Dr. Toni Amendlapacs pt meets inpatient criteria. [facility].   [address], [address]

## 2016-07-17 ENCOUNTER — Inpatient Hospital Stay
Admission: EM | Admit: 2016-07-17 | Discharge: 2016-07-19 | DRG: 885 | Disposition: A | Discharge status: Home / Self Care | Payer: BLUE CROSS/BLUE SHIELD | Source: Intra-hospital | Attending: Psychiatry | Admitting: Psychiatry

## 2016-07-17 ENCOUNTER — Telehealth: Payer: Self-pay

## 2016-07-17 ENCOUNTER — Encounter: Payer: Self-pay | Admitting: *Deleted

## 2016-07-17 DIAGNOSIS — Z331 Pregnant state, incidental: Secondary | ICD-10-CM | POA: Diagnosis present

## 2016-07-17 DIAGNOSIS — Z3A12 12 weeks gestation of pregnancy: Secondary | ICD-10-CM

## 2016-07-17 DIAGNOSIS — F429 Obsessive-compulsive disorder, unspecified: Secondary | ICD-10-CM | POA: Diagnosis present

## 2016-07-17 DIAGNOSIS — F332 Major depressive disorder, recurrent severe without psychotic features: Principal | ICD-10-CM

## 2016-07-17 DIAGNOSIS — Z833 Family history of diabetes mellitus: Secondary | ICD-10-CM | POA: Diagnosis not present

## 2016-07-17 DIAGNOSIS — F481 Depersonalization-derealization syndrome: Secondary | ICD-10-CM | POA: Diagnosis present

## 2016-07-17 DIAGNOSIS — Z823 Family history of stroke: Secondary | ICD-10-CM

## 2016-07-17 DIAGNOSIS — G4700 Insomnia, unspecified: Secondary | ICD-10-CM | POA: Diagnosis present

## 2016-07-17 DIAGNOSIS — O99341 Other mental disorders complicating pregnancy, first trimester: Secondary | ICD-10-CM | POA: Diagnosis not present

## 2016-07-17 DIAGNOSIS — Z6841 Body Mass Index (BMI) 40.0 and over, adult: Secondary | ICD-10-CM

## 2016-07-17 DIAGNOSIS — R45851 Suicidal ideations: Secondary | ICD-10-CM | POA: Diagnosis present

## 2016-07-17 DIAGNOSIS — F410 Panic disorder [episodic paroxysmal anxiety] without agoraphobia: Secondary | ICD-10-CM | POA: Diagnosis present

## 2016-07-17 DIAGNOSIS — Z3490 Encounter for supervision of normal pregnancy, unspecified, unspecified trimester: Secondary | ICD-10-CM | POA: Diagnosis present

## 2016-07-17 DIAGNOSIS — Z8249 Family history of ischemic heart disease and other diseases of the circulatory system: Secondary | ICD-10-CM | POA: Diagnosis not present

## 2016-07-17 LAB — MATERNIT 21 PLUS CORE, BLOOD
CHROMOSOME 13: NEGATIVE
Chromosome 18: NEGATIVE
Chromosome 21: NEGATIVE
PDF: 0
Y CHROMOSOME: DETECTED

## 2016-07-17 MED ORDER — PRENATAL MULTIVITAMIN CH
1.0000 | ORAL_TABLET | Freq: Every day | ORAL | Status: DC
  Administered 2016-07-18 – 2016-07-19 (×2): 1 via ORAL
  Filled 2016-07-17 (×3): qty 1

## 2016-07-17 MED ORDER — ZOLPIDEM TARTRATE 5 MG PO TABS: 5.0000 mg | ORAL_TABLET | Freq: Every day | ORAL | Status: DC

## 2016-07-17 MED ORDER — MAGNESIUM HYDROXIDE 400 MG/5ML PO SUSP: 30.0000 mL | Freq: Every day | ORAL | Status: DC | PRN

## 2016-07-17 MED ORDER — FOLIC ACID 1 MG PO TABS
2.0000 mg | ORAL_TABLET | Freq: Every day | ORAL | Status: DC
  Administered 2016-07-17 – 2016-07-19 (×3): 2 mg via ORAL
  Filled 2016-07-17 (×3): qty 2

## 2016-07-17 MED ORDER — MELATONIN 5 MG PO TABS
5.0000 mg | ORAL_TABLET | Freq: Every day | ORAL | Status: DC
  Administered 2016-07-17 – 2016-07-18 (×2): 5 mg via ORAL
  Filled 2016-07-17 (×4): qty 1

## 2016-07-17 MED ORDER — ALUM & MAG HYDROXIDE-SIMETH 200-200-20 MG/5ML PO SUSP: 30.0000 mL | ORAL | Status: DC | PRN

## 2016-07-17 MED ORDER — LURASIDONE HCL 40 MG PO TABS
40.0000 mg | ORAL_TABLET | Freq: Every day | ORAL | Status: DC
  Administered 2016-07-17 – 2016-07-18 (×2): 40 mg via ORAL
  Filled 2016-07-17 (×2): qty 1

## 2016-07-17 MED ORDER — SERTRALINE HCL 100 MG PO TABS
100.0000 mg | ORAL_TABLET | Freq: Every day | ORAL | Status: DC
  Administered 2016-07-17 – 2016-07-18 (×2): 100 mg via ORAL
  Filled 2016-07-17 (×5): qty 1

## 2016-07-17 MED ORDER — ACETAMINOPHEN 325 MG PO TABS: 650.0000 mg | ORAL_TABLET | Freq: Four times a day (QID) | ORAL | Status: DC | PRN

## 2016-07-17 MED ORDER — PRENATAL 27-0.8 MG PO TABS: 1.0000 | ORAL_TABLET | Freq: Every day | ORAL | Status: DC

## 2016-07-17 MED ORDER — PROMETHAZINE HCL 25 MG PO TABS: 12.5000 mg | ORAL_TABLET | Freq: Four times a day (QID) | ORAL | Status: DC | PRN

## 2016-07-17 NOTE — H&P (Signed)
Psychiatric Admission Assessment Adult  Patient Identification: Latasha Alexander MRN:  [id number] Date of Evaluation:  07/17/2016 Chief Complaint:  Depression Principal Diagnosis: <principal problem not specified> Diagnosis:   Patient Active Problem List   Diagnosis Date Noted  . OCD (obsessive compulsive disorder) [F42.9] 07/17/2016  . Severe recurrent major depression without psychotic features ([place]) [F33.2] 07/16/2016  . Pregnant [Z34.90] 07/16/2016  . Anxiety and depression [F41.8] 07/08/2016  . Nausea and vomiting during pregnancy [O21.9] 07/08/2016  . Urinary symptom or sign [R39.9] 07/08/2016  . Supervision of high risk pregnancy in first trimester [O09.91] 07/08/2016  . Morbid obesity with BMI of 45.0-49.9, adult ([place]) [E66.01, Z68.42] 06/06/2016   History of Present Illness:   Identifying data. Latasha Alexander is a 26 year old pregnant female with history of depression and anxiety.  Chief complaint. "I have intrusive thoughts of suicide."  History of present illness. Information was obtained from the patient and the chart. The patient has a long history of depression anxiety of OCD type and mood instability. She has been stable on a combination of Abilify, Zoloft, and trazodone prescribed by Latasha Alexander at [place]. 3 weeks ago after she realized that she is pregnant she consulted Latasha Alexander who recommended that she stops all her medications. Unfortunately the patient became increasingly depressed, anxious, experiencing periods of derealization, intrusive thoughts of suicide and also thoughts about terminating her pregnancy. She believes that OCD and intrusive thoughts as well as derealization are much more difficult to deal with her depression. She has been very happy with the effects of Abilify and is looking forward to find another medication that his stimuli but can be used in pregnancy. She denies psychotic symptoms or symptoms suggestive of bipolar mania. There is no substance  use.  Past psychiatric history. She has been hospitalized once in 2014 for worsening of depression and anxiety. She never attempted suicide. She sees Latasha Alexander and her therapist regularly.  Family psychiatric history. None reported.  Social history. She lives with her parents. 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 She no longer is with the father of the baby but has not seen upset about it. She has excellent support at home.  Total Time spent with patient: 1 hour  Is the patient at risk to self? Yes.    Has the patient been a risk to self in the past 6 months? No.  Has the patient been a risk to self within the distant past? No.  Is the patient a risk to others? No.  Has the patient been a risk to others in the past 6 months? No.  Has the patient been a risk to others within the distant past? No.   Prior Inpatient Therapy:   Prior Outpatient Therapy:    Alcohol Screening: 1. How often do you have a drink containing alcohol?: Never 9. Have you or someone else been injured as a result of your drinking?: No 10. Has a relative or friend or a doctor or another health worker been concerned about your drinking or suggested you cut down?: No Alcohol Use Disorder Identification Test Final Score (AUDIT): 0 Brief Intervention: AUDIT score less than 7 or less-screening does not suggest unhealthy drinking-brief intervention not indicated Substance Abuse History in the last 12 months:  No. Consequences of Substance Abuse: NA Previous Psychotropic Medications: Yes  Psychological Evaluations: No  Past Medical History:  Past Medical History:  Diagnosis Date  . Anxiety   . Depression   . OCD (obsessive  compulsive disorder)   . Panic attacks     Past Surgical History:  Procedure Laterality Date  . NO PAST SURGERIES    . none     Family History:  Family History  Problem Relation Age of Onset  . Diabetes Mother   . Hypertension Mother   . Hypertension Sister   . Stroke  Maternal Grandfather     Tobacco Screening: Have you used any form of tobacco in the last 30 days? (Cigarettes, Smokeless Tobacco, Cigars, and/or Pipes): No Social History:  History  Alcohol Use No     History  Drug Use No    Additional Social History:                           Allergies:   Allergies  Allergen Reactions  . Cashew Nut Oil Swelling   Lab Results:  Results for orders placed or performed during the hospital encounter of 07/16/16 (from the past 48 hour(s))  Comprehensive metabolic panel     Status: None   Collection Time: 07/16/16 11:41 AM  Result Value Ref Range   Sodium 136 135 - 145 mmol/L   Potassium 3.6 3.5 - 5.1 mmol/L   Chloride 104 101 - 111 mmol/L   CO2 23 22 - 32 mmol/L   Glucose, Bld 89 65 - 99 mg/dL   BUN 6 6 - 20 mg/dL   Creatinine, Ser 0.48 0.44 - 1.00 mg/dL   Calcium 9.1 8.9 - 10.3 mg/dL   Total Protein 7.9 6.5 - 8.1 g/dL   Albumin 3.7 3.5 - 5.0 g/dL   AST 21 15 - 41 U/L   ALT 15 14 - 54 U/L   Alkaline Phosphatase 51 38 - 126 U/L   Total Bilirubin 0.3 0.3 - 1.2 mg/dL   GFR calc non Af Amer >60 >60 mL/min   GFR calc Af Amer >60 >60 mL/min    Comment: (NOTE) The eGFR has been calculated using the CKD EPI equation. This calculation has not been validated in all clinical situations. eGFR's persistently <60 mL/min signify possible Chronic Kidney Disease.    Anion gap 9 5 - 15  Ethanol     Status: None   Collection Time: 07/16/16 11:41 AM  Result Value Ref Range   Alcohol, Ethyl (B) <5 <5 mg/dL    Comment:        LOWEST DETECTABLE LIMIT FOR SERUM ALCOHOL IS 5 mg/dL FOR MEDICAL PURPOSES ONLY   Salicylate level     Status: None   Collection Time: 07/16/16 11:41 AM  Result Value Ref Range   Salicylate Lvl <0.7 2.8 - 30.0 mg/dL  Acetaminophen level     Status: Abnormal   Collection Time: 07/16/16 11:41 AM  Result Value Ref Range   Acetaminophen (Tylenol), Serum <10 (L) 10 - 30 ug/mL    Comment:        THERAPEUTIC  CONCENTRATIONS VARY SIGNIFICANTLY. A RANGE OF 10-30 ug/mL MAY BE AN EFFECTIVE CONCENTRATION FOR MANY PATIENTS. HOWEVER, SOME ARE BEST TREATED AT CONCENTRATIONS OUTSIDE THIS RANGE. ACETAMINOPHEN CONCENTRATIONS >150 ug/mL AT 4 HOURS AFTER INGESTION AND >50 ug/mL AT 12 HOURS AFTER INGESTION ARE OFTEN ASSOCIATED WITH TOXIC REACTIONS.   cbc     Status: None   Collection Time: 07/16/16 11:41 AM  Result Value Ref Range   WBC 10.7 3.6 - 11.0 K/uL   RBC 4.11 3.80 - 5.20 MIL/uL   Hemoglobin 12.5 12.0 - 16.0 g/dL   HCT 36.2  35.0 - 47.0 %   MCV 88.1 80.0 - 100.0 fL   MCH 30.5 26.0 - 34.0 pg   MCHC 34.6 32.0 - 36.0 g/dL   RDW 13.3 11.5 - 14.5 %   Platelets 327 150 - 440 K/uL  Urine Drug Screen, Qualitative     Status: None   Collection Time: 07/16/16 11:41 AM  Result Value Ref Range   Tricyclic, Ur Screen NONE DETECTED NONE DETECTED   Amphetamines, Ur Screen NONE DETECTED NONE DETECTED   MDMA (Ecstasy)Ur Screen NONE DETECTED NONE DETECTED   Cocaine Metabolite,Ur [place] NONE DETECTED NONE DETECTED   Opiate, Ur Screen NONE DETECTED NONE DETECTED   Phencyclidine (PCP) Ur S NONE DETECTED NONE DETECTED   Cannabinoid 50 Ng, Ur [place] NONE DETECTED NONE DETECTED   Barbiturates, Ur Screen NONE DETECTED NONE DETECTED   Benzodiazepine, Ur Scrn NONE DETECTED NONE DETECTED   Methadone Scn, Ur NONE DETECTED NONE DETECTED    Comment: (NOTE) 355  Tricyclics, urine               Cutoff 1000 ng/mL 200  Amphetamines, urine             Cutoff 1000 ng/mL 300  MDMA (Ecstasy), urine           Cutoff 500 ng/mL 400  Cocaine Metabolite, urine       Cutoff 300 ng/mL 500  Opiate, urine                   Cutoff 300 ng/mL 600  Phencyclidine (PCP), urine      Cutoff 25 ng/mL 700  Cannabinoid, urine              Cutoff 50 ng/mL 800  Barbiturates, urine             Cutoff 200 ng/mL 900  Benzodiazepine, urine           Cutoff 200 ng/mL 1000 Methadone, urine                Cutoff 300 ng/mL 1100 1200 The urine drug  screen provides only a preliminary, unconfirmed 1300 analytical test result and should not be used for non-medical 1400 purposes. Clinical consideration and professional judgment should 1500 be applied to any positive drug screen result due to possible 1600 interfering substances. A more specific alternate chemical method 1700 must be used in order to obtain a confirmed analytical result.  1800 Gas chromato graphy / mass spectrometry (GC/MS) is the preferred 1900 confirmatory method.     Blood Alcohol level:  Lab Results  Component Value Date   ETH <5 97/41/6384    Metabolic Disorder Labs:  Lab Results  Component Value Date   HGBA1C 5.3 06/06/2016   No results found for: PROLACTIN No results found for: CHOL, TRIG, HDL, CHOLHDL, VLDL, LDLCALC  Current Medications: Current Facility-Administered Medications  Medication Dose Route Frequency Provider Last Rate Last Dose  . acetaminophen (TYLENOL) tablet 650 mg  650 mg Oral Q6H PRN Gonzella Lex, MD      . alum & mag hydroxide-simeth (MAALOX/MYLANTA) 200-200-20 MG/5ML suspension 30 mL  30 mL Oral Q4H PRN Gonzella Lex, MD      . folic acid (FOLVITE) tablet 2 mg  2 mg Oral Daily Banjamin Stovall B Shadae Reino, MD   2 mg at 07/17/16 1650  . magnesium hydroxide (MILK OF MAGNESIA) suspension 30 mL  30 mL Oral Daily PRN Gonzella Lex, MD      . Derrill Memo ON  07/18/2016] prenatal multivitamin tablet 1 tablet  1 tablet Oral Daily Gonzella Lex, MD      . promethazine (PHENERGAN) tablet 12.5 mg  12.5 mg Oral Q6H PRN Gonzella Lex, MD      . sertraline (ZOLOFT) tablet 100 mg  100 mg Oral Daily Gonzella Lex, MD   100 mg at 07/17/16 1650   PTA Medications: Prescriptions Prior to Admission  Medication Sig Dispense Refill Last Dose  . Prenatal Vit-Fe Fumarate-FA (MULTIVITAMIN-PRENATAL) 27-0.8 MG TABS tablet Take 1 tablet by mouth daily at 12 noon.   Taking  . promethazine (PHENERGAN) 12.5 MG tablet Take 1 tablet (12.5 mg total) by mouth every 6 (six)  hours as needed for nausea or vomiting. 30 tablet 2   . sertraline (ZOLOFT) 100 MG tablet Take 100 mg by mouth daily.   Taking    Musculoskeletal: Strength & Muscle Tone: within normal limits Gait & Station: normal Patient leans: N/A  Psychiatric Specialty Exam: I reviewed physical examination performed in the emergency room with the findings. Physical Exam  Nursing note and vitals reviewed. Psychiatric: Her behavior is normal. Judgment normal. Her mood appears anxious. Her speech is slurred. Cognition and memory are normal. She exhibits a depressed mood. She expresses suicidal ideation. She expresses suicidal plans.    Review of Systems  Psychiatric/Behavioral: Positive for depression and suicidal ideas.  All other systems reviewed and are negative.   Blood pressure (!) 142/73, pulse (!) 106, temperature 98.1 F (36.7 C), temperature source Oral, resp. rate 18, height 5' 5"  (1.651 m), weight 133.4 kg (294 lb), last menstrual period 04/18/2016.Body mass index is 48.92 kg/m.  See SRA.                                                  Sleep:       Treatment Plan Summary: Daily contact with patient to assess and evaluate symptoms and progress in treatment and Medication management   Ms. Clutter is a 26 year old FEMALE with history of depression and anxiety who is also [redacted] weeks pregnant admitted for suicidal ideation in the context of treatment discontinuation.  1. Suicidal ideation. The patient is able to contract for safety in the hospital.  2. Mood. She has been maintained on a combination of Zoloft, Abilify, and trazodone for depression, OCD and mood stability. This was discontinued 3 weeks ago when she realized she were pregnant. She was restarted on the Zoloft. We will substitute Abilify with Latuda.  3. Insomnia. We will offer Ambien.  4. Pregnancy. She is in the care of an obstetrician. Will give very night her vitamins and folic acid.  5. Metabolic  syndrome monitoring. Lipid panel, TSH and hemoglobin A1c are pending.  6. Disposition. She will be discharged to home with her parents. She will follow up with [place].   Observation Level/Precautions:  15 minute checks  Laboratory:  CBC Chemistry Profile HCG UDS UA  Psychotherapy:    Medications:    Consultations:    Discharge Concerns:    Estimated LOS:  Other:     Physician Treatment Plan for Primary Diagnosis: <principal problem not specified> Long Term Goal(s): Improvement in symptoms so as ready for discharge  Short Term Goals: Ability to identify changes in lifestyle to reduce recurrence of condition will improve, Ability to verbalize feelings will improve, Ability to disclose and  discuss suicidal ideas, Ability to demonstrate self-control will improve, Ability to identify and develop effective coping behaviors will improve, Ability to maintain clinical measurements within normal limits will improve, Compliance with prescribed medications will improve and Ability to identify triggers associated with substance abuse/mental health issues will improve  Physician Treatment Plan for Secondary Diagnosis: Active Problems:   Morbid obesity with BMI of 45.0-49.9, adult (HCC)   Severe recurrent major depression without psychotic features ([place])   Pregnant   OCD (obsessive compulsive disorder)  Long Term Goal(s): NA  Short Term Goals: NA  I certify that inpatient services furnished can reasonably be expected to improve the patient's condition.    Orson Slick, MD 1/23/20184:54 PM

## 2016-07-17 NOTE — Progress Notes (Signed)
Recreation Therapy Notes  Date: 01.23.18 Time: 3:00 pm Location: Craft Room  Group Topic: Self-expression  Goal Area(s) Addresses:  Patient will be able to identify a color that represents each emotion. Patient will verbalize benefit of using art as a means of self-expression. Patient will verbalize one emotion experienced while participating in activity.  Behavioral Response: Attentive, Interactive  Intervention: The Colors Within Me  Activity: Patients were given blank face worksheets and were instructed to pick a color for each emotion they were feeling and to show on the worksheet how much of that emotion they are feeling.  Education: LRT educated patients on other forms of self-expression.  Education Outcome: Acknowledges education/In group clarification offered   Clinical Observations/Feedback: Patient picked colors for each emotion she was feeling and showed on the worksheet how much of that emotion she was feeling. Patient contributed to group discussion by stating what emotions she was feeling, how these emotions affect her treatment in the hospital, what can cause her emotions to change, how she sees her emotions changing once she is d/c, and that it was helpful to see her emotions and why.  Jacquelynn CreeGreene,Alpha Mysliwiec M, LRT/CTRS 07/17/2016 3:56 PM

## 2016-07-17 NOTE — Tx Team (Signed)
Initial Treatment Plan 07/17/2016 4:03 PM Latasha CroissantMargaret I Laver ZOX:[id number]    PATIENT STRESSORS: Loss of relationship Medication change or noncompliance   PATIENT STRENGTHS: Ability for insight Average or above average intelligence Communication skills General fund of knowledge Motivation for treatment/growth Supportive family/friends   PATIENT IDENTIFIED PROBLEMS: Major Depressive Disorder  Suicidal ideation                   DISCHARGE CRITERIA:  Improved stabilization in mood, thinking, and/or behavior  PRELIMINARY DISCHARGE PLAN: Outpatient therapy  PATIENT/FAMILY INVOLVEMENT: This treatment plan has been presented to and reviewed with the patient, Latasha CroissantMargaret I Trotta, and/or family member, .  The patient and family have been given the opportunity to ask questions and make suggestions.  Shelia MediaJones, Eupha Lobb, RN 07/17/2016, 4:03 PM

## 2016-07-17 NOTE — ED Notes (Addendum)
[facility]lu LundJanet, [facility]  Pt made aware she will be transferred to [facility] shortly. Pt accepting. RN assured pt she will start her medications once on the unit. Pt remains tearful. Maintained on 15 minute checks and observation by security camera for safety.

## 2016-07-17 NOTE — [facility] Suicide Risk Assessment (Signed)
[facility] Admission Suicide Risk Assessment   Nursing information obtained from:    Demographic factors:    Current Mental Status:    Loss Factors:    Historical Factors:    Risk Reduction Factors:     Total Time spent with patient: 1 hour Principal Problem: <principal problem not specified> Diagnosis:   Patient Active Problem List   Diagnosis Date Noted  . OCD (obsessive compulsive disorder) [F42.9] 07/17/2016  . Severe recurrent major depression without psychotic features (HCC) [F33.2] 07/16/2016  . Pregnant [Z34.90] 07/16/2016  . Anxiety and depression [F41.8] 07/08/2016  . Nausea and vomiting during pregnancy [O21.9] 07/08/2016  . Urinary symptom or sign [R39.9] 07/08/2016  . Supervision of high risk pregnancy in first trimester [O09.91] 07/08/2016  . Morbid obesity with BMI of 45.0-49.9, adult (HCC) [E66.01, Z68.42] 06/06/2016   Subjective Data: Suicidal ideation.  Continued Clinical Symptoms:  Alcohol Use Disorder Identification Test Final Score (AUDIT): 0 The "Alcohol Use Disorders Identification Test", Guidelines for Use in Primary Care, Second Edition.  World Science writer [facility]). Score between 0-7:  no or low risk or alcohol related problems. Score between 8-15:  moderate risk of alcohol related problems. Score between 16-19:  high risk of alcohol related problems. Score 20 or above:  warrants further diagnostic evaluation for alcohol dependence and treatment.   CLINICAL FACTORS:   Depression:   Insomnia Obsessive-Compulsive Disorder   Musculoskeletal: Strength & Muscle Tone: within normal limits Gait & Station: normal Patient leans: N/A  Psychiatric Specialty Exam: Physical Exam  Nursing note and vitals reviewed. Psychiatric: Her behavior is normal. Judgment normal. Her mood appears anxious. Her speech is slurred. Cognition and memory are normal. She exhibits a depressed mood. She expresses suicidal ideation. She expresses suicidal plans.    Review of  Systems  Psychiatric/Behavioral: Positive for depression and suicidal ideas.  All other systems reviewed and are negative.   Blood pressure (!) 142/73, pulse (!) 106, temperature 98.1 F (36.7 C), temperature source Oral, resp. rate 18, height 5\' 5"  (1.651 m), weight 133.4 kg (294 lb), last menstrual period 04/18/2016.Body mass index is 48.92 kg/m.  General Appearance: Casual  Eye Contact:  Good  Speech:  Slurred  Volume:  Normal  Mood:  Anxious and Depressed  Affect:  Appropriate  Thought Process:  Goal Directed and Descriptions of Associations: Intact  Orientation:  Full (Time, Place, and Person)  Thought Content:  WDL  Suicidal Thoughts:  Yes.  with intent/plan  Homicidal Thoughts:  No  Memory:  Immediate;   Fair Recent;   Fair Remote;   Fair  Judgement:  Fair  Insight:  Fair  Psychomotor Activity:  Normal  Concentration:  Concentration: Fair and Attention Span: Fair  Recall:  [employer] of Knowledge:  Fair  Language:  Fair  Akathisia:  No  Handed:  Right  AIMS (if indicated):     Assets:  Communication Skills Desire for Improvement Financial Resources/Insurance Housing Physical Health Resilience Social Support Transportation  ADL's:  Intact  Cognition:  WNL  Sleep:         COGNITIVE FEATURES THAT CONTRIBUTE TO RISK:  None    SUICIDE RISK:   Moderate:  Frequent suicidal ideation with limited intensity, and duration, some specificity in terms of plans, no associated intent, good self-control, limited dysphoria/symptomatology, some risk factors present, and identifiable protective factors, including available and accessible social support.  PLAN OF CARE: Hospital admission, medication management, discharge planning.  Ms. Honsinger is a 26 year old FEMALE with history of depression  and anxiety who is also [redacted] weeks pregnant admitted for suicidal ideation in the context of treatment discontinuation.  1. Suicidal ideation. The patient is able to contract for safety  in the hospital.  2. Mood. She has been maintained on a combination of Zoloft, Abilify, and trazodone for depression, OCD and mood stability. This was discontinued 3 weeks ago when she realized she were pregnant. She was restarted on the Zoloft. We will substitute Abilify with Latuda.  3. Insomnia. We will offer Ambien.  4. Pregnancy. She is in the care of an obstetrician. Will give very night her vitamins and folic acid.  5. Metabolic syndrome monitoring. Lipid panel, TSH and hemoglobin A1c are pending.  6. Disposition. She will be discharged to home with her parents. She will follow up with [facility].  I certify that inpatient services furnished can reasonably be expected to improve the patient's condition.   Kristine LineaJolanta Raygan Skarda, MD 07/17/2016, 4:47 PM

## 2016-07-17 NOTE — Telephone Encounter (Signed)
Called pt pt no answer. LM for pt to call back

## 2016-07-17 NOTE — Progress Notes (Signed)
Patient presents pleasant and cooperative with care. Admitted for Depression and Suicidal ideation. Pt currently denies suicide. Denies HI, AVH, endorses depression. Pt stressor is that shes [redacted] weeks pregnant and trying to decide whether or not to terminate the pregnancy. Pt has supportive parents, but babys father is no longer in the picture. Pt was recently taken off her depression medication d/t pregnancy and experienced more SI, afterwards. Pt verbally contracts for safety. Oriented patient to room and unit. Skin and contraband search completed and witnessed by Aundra MilletMegan, Charity fundraiserN. No contraband noted, pat has cracked, hard, scaly heels. Fluid and nutrition offered. Safety checks maintained, admission completed. Pt remains safe on unit with q 15 min checks.

## 2016-07-17 NOTE — ED Notes (Signed)
Pt tearful in dayroom. Pt requesting that her medications be started while she waits in the BHU. Psychiatrist made aware. Maintained on 15 minute checks and observation by security camera for safety.

## 2016-07-17 NOTE — ED Provider Notes (Signed)
-----------------------------------------   6:57 AM on 07/17/2016 -----------------------------------------   Blood pressure 120/76, pulse 95, temperature 98.3 F (36.8 C), temperature source Oral, resp. rate 18, height 5\' 5"  (1.651 m), weight 298 lb (135.2 kg), last menstrual period 04/18/2016, SpO2 98 %.  The patient had no acute events since last update.  Calm and cooperative at this time.  Disposition is pending per Psychiatry/Behavioral Medicine team recommendations.     Nita Sicklearolina Chukwudi Ewen, MD 07/17/16 [phone]

## 2016-07-17 NOTE — Telephone Encounter (Signed)
-----   Message from Linzie Collinavid James Evans, MD sent at 07/17/2016  8:14 AM EST ----- Results show no evidence of chromosomal abnormality.

## 2016-07-17 NOTE — ED Notes (Signed)
Pt IVC. Transferred to BMU with [facility]. All belongings will be sent with patient.

## 2016-07-18 LAB — LIPID PANEL
CHOLESTEROL: 181 mg/dL (ref 0–200)
HDL: 63 mg/dL (ref >40)
LDL Cholesterol: 98 mg/dL (ref 0–99)
TRIGLYCERIDES: 102 mg/dL (ref <150)
Total CHOL/HDL Ratio: 2.9 RATIO
VLDL: 20 mg/dL (ref 0–40)

## 2016-07-18 LAB — TSH: TSH: 2.151 u[IU]/mL (ref 0.350–4.500)

## 2016-07-18 MED ORDER — PROMETHAZINE HCL 25 MG PO TABS
12.5000 mg | ORAL_TABLET | Freq: Four times a day (QID) | ORAL | Status: DC | PRN
  Administered 2016-07-18: 12.5 mg via ORAL
  Filled 2016-07-18: qty 1

## 2016-07-18 MED FILL — Prenatal Vit w/ Fe Fumarate-FA Tab 27-1 MG: ORAL | Qty: 1 | Status: AC

## 2016-07-18 NOTE — Progress Notes (Signed)
D: Patient stated slept good last night .Stated appetite is fair and energy level  Is normal. Stated concentration is good . Stated on Depression scale , 4 hopeless 4 and anxiety  6.( low 0-10 high) Denies suicidal  homicidal ideations  .  No auditory hallucinations  No pain concerns . Appropriate ADL'S. Interacting with peers and staff.  Patient observed anxious and nevous  During shift  Voice of needing to take nausea medication with her medication Voice of her goal today having a happy attitude  Attend groups  Medication follow up  A: Encourage patient participation with unit programming . Instruction  Given on  Medication , verbalize understanding. R: Voice no other concerns. Staff continue to monitor

## 2016-07-18 NOTE — Progress Notes (Signed)
D: Pt denies SI/HI/AVH. Pt is pleasant and cooperative, affect is flat and sad. Pt appears less anxious and she is not interacting with peers and staff appropriately.  A: Pt was offered support and encouragement. Pt was given scheduled medications. Pt was encouraged to attend groups. Q 15 minute checks were done for safety.  R:Pt did not attends evening group. Patient is not interacting with peers and staff. Pt is taking medication. Pt has no complaints.Pt receptive to treatment and safety maintained on unit.   

## 2016-07-18 NOTE — [facility] Suicide Risk Assessment (Signed)
[facility] INPATIENT:  Family/Significant Other Suicide Prevention Education  Suicide Prevention Education:  Contact Attempts:Hope & Earl LitesGregory Medearis(parents [phone]), has been identified by the patient as the family member/significant other with whom the patient will be residing, and identified as the person(s) who will aid the patient in the event of a mental health crisis.  With written consent from the patient, two attempts were made to provide suicide prevention education, prior to and/or following the patient's discharge.  We were unsuccessful in providing suicide prevention education.  A suicide education pamphlet was given to the patient to share with family/significant other.  Date and time of first attempt: 07/18/2016 / 11:48am Date and time of second attempt: 07/18/2016 / 1:59am  Jeshurun [place] G. Garnette CzechSampson MSW, LCSWA 07/18/2016, 1:59 PM

## 2016-07-18 NOTE — [facility] Counselor (Signed)
Adult Comprehensive Assessment  Patient ID: Latasha Alexander, female   DOB: 12-09-1990, 26 y.o.   MRN: [id number]  Information Source: Information source: Patient  Current Stressors:  Educational / Learning stressors: none reported Employment / Job issues: pt is unemployed Family Relationships: none reported Surveyor, quantityinancial / Lack of resources (include bankruptcy): none reported Housing / Lack of housing: none reported Physical health (include injuries & life threatening diseases): recently found out she was pregnant Social relationships: problems with boyfriend once she told him she was pregnant Substance abuse: none reported  Bereavement / Loss: none reported  Living/Environment/Situation:  Living Arrangements: Parent Living conditions (as described by patient or guardian): good How long has patient lived in current situation?: entire life What is atmosphere in current home: Comfortable  Family History:  Marital status: Single Does patient have children?: No (recently found out she is pregnant)  Childhood History:  By whom was/is the patient raised?: Both parents Additional childhood history information: Pt raised in 2 parent home, reports childhood was positive. Description of patient's relationship with caregiver when they were a child: good Patient's description of current relationship with people who raised him/her: good-still lives with parents How were you disciplined when you got in trouble as a child/adolescent?: appropriate Does patient have siblings?: Yes Number of Siblings: 2 Description of patient's current relationship with siblings: Both siblings remain in home as well.  Positive relationships. Did patient suffer any verbal/emotional/physical/sexual abuse as a child?: No Did patient suffer from severe childhood neglect?: No Has patient ever been sexually abused/assaulted/raped as an adolescent or adult?: No Was the patient ever a victim of a crime or a disaster?:  No Witnessed domestic violence?: No Has patient been effected by domestic violence as an adult?: No  Education:  Highest grade of school patient has completed: GED Currently a student?: No (applying to restart school at [employer]) Learning disability?: No  Employment/Work Situation:   Employment situation: Unemployed Patient's job has been impacted by current illness: No What is the longest time patient has a held a job?: 3-4 years Where was the patient employed at that time?: [employer]-quit due to transportation issues Has patient ever been in the Eli [employer] and Companymilitary?: No Are There Guns or Other Weapons in Your Home?: No  Financial Resources:   Surveyor, quantityinancial resources: Support from parents / caregiver, Medicaid Does patient have a Lawyerrepresentative payee or guardian?: No  Alcohol/Substance Abuse:   What has been your use of drugs/alcohol within the last 12 months?: No use reported If attempted suicide, did drugs/alcohol play a role in this?: No Alcohol/Substance Abuse Treatment Hx: Denies past history Has alcohol/substance abuse ever caused legal problems?: No  Social Support System:   Conservation officer, natureatient's Community Support System: Passenger transport managerGood Describe Community Support System: mom, sister Type of faith/religion: none reported  Leisure/Recreation:   Leisure and Hobbies: board games with family  Strengths/Needs:   What things does the patient do well?: I care about people In what areas does patient struggle / problems for patient: handling problems by myself  Discharge Plan:   Does patient have access to transportation?: Yes Will patient be returning to same living situation after discharge?: Yes Currently receiving community mental health services: Yes (From Whom) Advice worker([facility]) Does patient have financial barriers related to discharge medications?: No (medicaid)  Summary/Recommendations:   Summary and Recommendations (to be completed by the evaluator): Pt is 26 year old [place] [facility]  Pt diagnosed with OCD and major depressive disorder and hospitalized after her normal psychiatric medications  were stopped due to pregnancy leading to increased depression.  Recommendations for pt include crisis stabizilation, therapeutic milieu, attend and participate in groups, medication management, and development of comrehensive mental wellness program.  Upon discharge, pt would like to return to her current outpatient provider, [facility].  Lorri Frederick. 07/18/2016

## 2016-07-18 NOTE — [facility] Group Notes (Signed)
[facility] LCSW Group Therapy   07/18/2016  9:30 am   Type of Therapy: Group Therapy   Participation Level: Active   Participation Quality: Attentive, Sharing and Supportive   Affect: Appropriate  Cognitive: Alert and Oriented   Insight: Developing/Improving and Engaged   Engagement in Therapy: Developing/Improving and Engaged   Modes of Intervention: Clarification, Confrontation, Discussion, Education, Exploration, Limit-setting, Orientation, Problem-solving, Rapport Building, Dance movement psychotherapisteality Testing, Socialization and Support   Summary of Progress/Problems: The topic for group today was emotional regulation. This group focused on both positive and negative emotion identification and allowed  group members to process ways to identify feelings, regulate negative emotions, and find healthy ways to manage internal/external emotions. Group members were asked to reflect on a time when their reaction to an emotion led to a negative outcome and explored how alternative responses using emotion regulation would have benefited them. Group members were also asked to discuss a time when emotion regulation was utilized when a negative emotion was experienced. Pt defined emotional regulation as "fixing bad emotions that you may have." Pt stated that a negative emotion that led to her hospitalization is feeling upset. She stated that the behavior that this emotion often comes with is crying spells. Pt identified playing board games, talking with someone supportive, and complying with medications as healthy behaviors to regulate negative emotions.    Hampton AbbotKadijah Lakeyn Dokken, MSW, LCSWA 07/18/2016, 10:13AM

## 2016-07-18 NOTE — Plan of Care (Signed)
Problem: Coping: Goal: Ability to verbalize frustrations and anger appropriately will improve Outcome: Progressing Working on coping skills    

## 2016-07-18 NOTE — Progress Notes (Signed)
Recreation Therapy Notes  Date: 01.24.18 Time: 1:00 pm Location: Craft Room  Group Topic: Self-esteem  Goal Area(s) Addresses:  Patient will write at least one positive trait about self. Patient will verbalize benefit of having a healthy self-esteem.  Behavioral Response: Attentive  Intervention: I Am  Activity: Patients were given a worksheet with the letter I on it and were instructed to write as many positive traits about themselves inside the letter.   Education: LRT educated patients on ways to increase their self-esteem.  Education Outcome: Acknowledges education/In group clarification offered  Clinical Observations/Feedback: Patient wrote positive traits. Patient did not contribute to group discussion.  Jacquelynn CreeGreene,Altamese Deguire M, LRT/CTRS 07/18/2016 1:51 PM

## 2016-07-18 NOTE — Telephone Encounter (Signed)
Called pt, another member of the family answers, advised them to have pt call the office.

## 2016-07-18 NOTE — Progress Notes (Signed)
[facility] MD Progress Note  07/18/2016 1:42 PM Latasha Alexander  MRN:  [id number]  Subjective:    07/18/2016. Latasha Alexander feels slightly better today. She is still overwhelmed with anticipatory anxiety about the possibility of having a derealization spell. No longer she is talking about hurting herself or the baby. She started Jordan last night and experience nausea. She slept well with melatonin. She is was asking for something for nausea. Otherwise there are no somatic complaints. She participates in programming.  Per nursing: D: Pt denies SI/HI/AVH. Pt is pleasant and cooperative, affect is flat and sad. Pt appears less anxious and she is not interacting with peers and staff appropriately.  A: Pt was offered support and encouragement. Pt was given scheduled medications. Pt was encouraged to attend groups. Q 15 minute checks were done for safety.  R:Pt did not attends evening group. Patient is not interacting with peers and staff. Pt is taking medication. Pt has no complaints.Pt receptive to treatment and safety maintained on unit.  Principal Problem: Severe recurrent major depression without psychotic features (HCC) Diagnosis:   Patient Active Problem List   Diagnosis Date Noted  . OCD (obsessive compulsive disorder) [F42.9] 07/17/2016  . Severe recurrent major depression without psychotic features (HCC) [F33.2] 07/16/2016  . Pregnant [Z34.90] 07/16/2016  . Anxiety and depression [F41.8] 07/08/2016  . Nausea and vomiting during pregnancy [O21.9] 07/08/2016  . Urinary symptom or sign [R39.9] 07/08/2016  . Supervision of high risk pregnancy in first trimester [O09.91] 07/08/2016  . Morbid obesity with BMI of 45.0-49.9, adult (HCC) [E66.01, Z68.42] 06/06/2016   Total Time spent with patient: 20 minutes  Past Psychiatric History: Depression and anxiety.  Past Medical History:  Past Medical History:  Diagnosis Date  . Anxiety   . Depression   . OCD (obsessive compulsive disorder)   .  Panic attacks     Past Surgical History:  Procedure Laterality Date  . NO PAST SURGERIES    . none     Family History:  Family History  Problem Relation Age of Onset  . Diabetes Mother   . Hypertension Mother   . Hypertension Sister   . Stroke Maternal Grandfather    Family Psychiatric  History: See H&P. Social History:  History  Alcohol Use No     History  Drug Use No    Social History   Social History  . Marital status: Single    Spouse name: N/A  . Number of children: N/A  . Years of education: N/A   Social History Main Topics  . Smoking status: Never Smoker  . Smokeless tobacco: Never Used  . Alcohol use No  . Drug use: No  . Sexual activity: Yes    Partners: Male    Birth control/ protection: None     Comment: nexplanon taken out about 2 months before she was pregnant   Other Topics Concern  . None   Social History Narrative  . None   Additional Social History:                         Sleep: Fair  Appetite:  Fair  Current Medications: Current Facility-Administered Medications  Medication Dose Route Frequency Provider Last Rate Last Dose  . acetaminophen (TYLENOL) tablet 650 mg  650 mg Oral Q6H PRN Audery Amel, MD      . alum & mag hydroxide-simeth (MAALOX/MYLANTA) 200-200-20 MG/5ML suspension 30 mL  30 mL Oral Q4H PRN Audery Amel,  MD      . folic acid (FOLVITE) tablet 2 mg  2 mg Oral Daily Shari Prows, MD   2 mg at 07/18/16 0817  . lurasidone (LATUDA) tablet 40 mg  40 mg Oral Q supper Shari Prows, MD   40 mg at 07/17/16 1735  . magnesium hydroxide (MILK OF MAGNESIA) suspension 30 mL  30 mL Oral Daily PRN Audery Amel, MD      . Melatonin TABS 5 mg  5 mg Oral QHS Jolanta B Pucilowska, MD   5 mg at 07/17/16 2245  . prenatal multivitamin tablet 1 tablet  1 tablet Oral Daily Audery Amel, MD   1 tablet at 07/18/16 0817  . promethazine (PHENERGAN) tablet 12.5 mg  12.5 mg Oral Q6H PRN Audery Amel, MD      .  sertraline (ZOLOFT) tablet 100 mg  100 mg Oral Daily Audery Amel, MD   100 mg at 07/17/16 1650    Lab Results:  Results for orders placed or performed during the hospital encounter of 07/17/16 (from the past 48 hour(s))  Lipid panel     Status: None   Collection Time: 07/18/16  6:57 AM  Result Value Ref Range   Cholesterol 181 0 - 200 mg/dL   Triglycerides 161 <096 mg/dL   HDL 63 >04 mg/dL   Total CHOL/HDL Ratio 2.9 RATIO   VLDL 20 0 - 40 mg/dL   LDL Cholesterol 98 0 - 99 mg/dL    Comment:        Total Cholesterol/HDL:CHD Risk Coronary Heart Disease Risk Table                     Men   Women  1/2 Average Risk   3.4   3.3  Average Risk       5.0   4.4  2 X Average Risk   9.6   7.1  3 X Average Risk  23.4   11.0        Use the calculated Patient Ratio above and the CHD Risk Table to determine the patient's CHD Risk.        ATP III CLASSIFICATION (LDL):  <100     mg/dL   Optimal  540-981  mg/dL   Near or Above                    Optimal  130-159  mg/dL   Borderline  191-478  mg/dL   High  >295     mg/dL   Very High   TSH     Status: None   Collection Time: 07/18/16  6:57 AM  Result Value Ref Range   TSH 2.151 0.350 - 4.500 uIU/mL    Comment: Performed by a 3rd Generation assay with a functional sensitivity of <=0.01 uIU/mL.    Blood Alcohol level:  Lab Results  Component Value Date   ETH <5 07/16/2016    Metabolic Disorder Labs: Lab Results  Component Value Date   HGBA1C 5.3 06/06/2016   No results found for: PROLACTIN Lab Results  Component Value Date   CHOL 181 07/18/2016   TRIG 102 07/18/2016   HDL 63 07/18/2016   CHOLHDL 2.9 07/18/2016   VLDL 20 07/18/2016   LDLCALC 98 07/18/2016    Physical Findings: AIMS: Facial and Oral Movements Muscles of Facial Expression: None, normal Lips and Perioral Area: None, normal Jaw: None, normal Tongue: None, normal,Extremity Movements Upper (arms, wrists, hands,  fingers): None, normal Lower (legs, knees,  ankles, toes): None, normal, Trunk Movements Neck, shoulders, hips: None, normal, Overall Severity Severity of abnormal movements (highest score from questions above): None, normal Incapacitation due to abnormal movements: None, normal Patient's awareness of abnormal movements (rate only patient's report): No Awareness, Dental Status Current problems with teeth and/or dentures?: No Does patient usually wear dentures?: No  CIWA:    COWS:     Musculoskeletal: Strength & Muscle Tone: within normal limits Gait & Station: normal Patient leans: N/A  Psychiatric Specialty Exam: Physical Exam  Nursing note and vitals reviewed. Psychiatric: Her speech is normal and behavior is normal. Judgment and thought content normal. Her mood appears anxious. Cognition and memory are normal.    Review of Systems  Psychiatric/Behavioral: Positive for depression. The patient is nervous/anxious and has insomnia.   All other systems reviewed and are negative.   Blood pressure 115/63, pulse 75, temperature 99.4 F (37.4 C), temperature source Oral, resp. rate 18, height 5\' 5"  (1.651 m), weight 133.4 kg (294 lb), last menstrual period 04/18/2016.Body mass index is 48.92 kg/m.  General Appearance: Casual  Eye Contact:  Good  Speech:  Slurred  Volume:  Normal  Mood:  Anxious  Affect:  Appropriate  Thought Process:  Goal Directed and Descriptions of Associations: Intact  Orientation:  Full (Time, Place, and Person)  Thought Content:  WDL  Suicidal Thoughts:  No  Homicidal Thoughts:  No  Memory:  Immediate;   Fair Recent;   Fair Remote;   Fair  Judgement:  Fair  Insight:  Fair  Psychomotor Activity:  Normal  Concentration:  Concentration: Fair and Attention Span: Fair  Recall:  FiservFair  Fund of Knowledge:  Fair  Language:  Fair  Akathisia:  No  Handed:  Right  AIMS (if indicated):     Assets:  Communication Skills Desire for Improvement Financial Resources/Insurance Housing Physical  Health Resilience Social Support  ADL's:  Intact  Cognition:  WNL  Sleep:  Number of Hours: 7.15     Treatment Plan Summary: Daily contact with patient to assess and evaluate symptoms and progress in treatment and Medication management   Latasha Alexander is a 26 year old FEMALE with history of depression and anxiety who is also [redacted] weeks pregnant admitted for suicidal ideation in the context of treatment discontinuation.  1. Suicidal ideation. The patient is able to contract for safety in the hospital.  2. Mood. She has been maintained on a combination of Zoloft, Abilify, and trazodone for depression, OCD and mood stability in the community. This was discontinued 3 weeks ago when she realized she were pregnant. She was restarted on the Zoloft. We will substitute Abilify with Latuda. She is taking 100 mg of Zoloft and 80 mg of Latuda.  3. Insomnia. She slept well with Melatonin.  4. Pregnancy. She is in the care of an obstetrician. Will give prenatal vitamins and folic acid. We will offer phenergan for nausea.  5. Metabolic syndrome monitoring. Lipid panel, TSH and hemoglobin A1c are normal.   6. Disposition. She will be discharged to home with her parents. She will follow up with [facility].   Kristine LineaJolanta Pucilowska, MD 07/18/2016, 1:42 PM

## 2016-07-18 NOTE — Progress Notes (Signed)
Recreation Therapy Notes  INPATIENT RECREATION THERAPY ASSESSMENT  Patient Details Name: Serena CroissantMargaret I Dade MRN: [id number] DOB: April 07, 1991 Today's Date: 07/18/2016  Patient Stressors: Other (Comment) (Pregnancy)  Coping Skills:   Exercise, Talking, Music  Personal Challenges: Concentration, Decision-Making, Relationships, Self-Esteem/Confidence, Stress Management  Leisure Interests (2+):  Individual - Other (Comment) (Play games with family, go out with family)  Awareness of Community Resources:  Yes  Community Resources:  [facility], [employer]  Current Use: No  If no, Barriers?: Other (Comment) (Did not want to leave her house)  Patient Strengths:  Nice person, caring  Patient Identified Areas of Improvement:  Being able to control her thoughts  Current Recreation Participation:  Watching TV  Patient Goal for Hospitalization:  To get on the right medications  [place]   Current SI (including self-harm):  No  Current HI:  No  Consent to Intern Participation: N/A   Jacquelynn CreeGreene,Riyanshi Wahab M, LRT/CTRS 07/18/2016, 4:21 PM

## 2016-07-19 LAB — HEMOGLOBIN A1C
HEMOGLOBIN A1C: 5.5 % (ref 4.8–5.6)
Mean Plasma Glucose: 111 mg/dL

## 2016-07-19 MED ORDER — MELATONIN 5 MG PO TABS: 5.0000 mg | ORAL_TABLET | Freq: Every day | ORAL | 0 refills | Status: DC

## 2016-07-19 MED ORDER — FOLIC ACID 1 MG PO TABS: 2.0000 mg | ORAL_TABLET | Freq: Every day | ORAL | 1 refills | Status: DC

## 2016-07-19 MED ORDER — SERTRALINE HCL 25 MG PO TABS: 125.0000 mg | ORAL_TABLET | Freq: Every day | ORAL | Status: DC

## 2016-07-19 MED ORDER — LURASIDONE HCL 40 MG PO TABS: 40.0000 mg | ORAL_TABLET | Freq: Every day | ORAL | 1 refills | Status: DC

## 2016-07-19 MED ORDER — SERTRALINE HCL 25 MG PO TABS: 125.0000 mg | ORAL_TABLET | Freq: Every day | ORAL | 1 refills | Status: DC

## 2016-07-19 NOTE — Discharge Summary (Signed)
Physician Discharge Summary Note  Patient:  Latasha Alexander is an 26 y.o., female MRN:  [id number] DOB:  11/04/1990 Patient phone:  [phone] (home)  Patient address:   [address] [place] [id number],  Total Time spent with patient: 30 minutes  Date of Admission:  07/17/2016 Date of Discharge: 07/19/2016  Reason for Admission:  Suicidal ideation.  Identifying data. [name] is a 26 year old pregnant female with history of depression and anxiety.  Chief complaint. "I have intrusive thoughts of suicide."  History of present illness. Information was obtained from the patient and the chart. The patient has a long history of depression anxiety of OCD type and mood instability. She has been stable on a combination of Abilify, Zoloft, and trazodone prescribed by Dr. Lourdes SledgeLatif at [facility]. 3 weeks ago after she realized that she is pregnant she consulted Dr. Lourdes SledgeLatif who recommended that she stops all her medications. Unfortunately the patient became increasingly depressed, anxious, experiencing periods of derealization, intrusive thoughts of suicide and also thoughts about terminating her pregnancy. She believes that OCD and intrusive thoughts as well as derealization are much more difficult to deal with her depression. She has been very happy with the effects of Abilify and is looking forward to find another medication that his stimuli but can be used in pregnancy. She denies psychotic symptoms or symptoms suggestive of bipolar mania. There is no substance use.  Past psychiatric history. She has been hospitalized once in 2014 for worsening of depression and anxiety. She never attempted suicide. She sees Dr. Lourdes SledgeLatif and her therapist regularly.  Family psychiatric history. None reported.  Social history. She lives with her parents. 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 She no longer is with the father of the baby but has not seen upset about it. She has excellent  support at home.  Principal Problem: Severe recurrent major depression without psychotic features [facility](HCC) Discharge Diagnoses: Patient Active Problem List   Diagnosis Date Noted  . OCD (obsessive compulsive disorder) [F42.9] 07/17/2016  . Severe recurrent major depression without psychotic features (HCC) [F33.2] 07/16/2016  . Pregnant [Z34.90] 07/16/2016  . Anxiety and depression [F41.8] 07/08/2016  . Nausea and vomiting during pregnancy [O21.9] 07/08/2016  . Urinary symptom or sign [R39.9] 07/08/2016  . Supervision of high risk pregnancy in first trimester [O09.91] 07/08/2016  . Morbid obesity with BMI of 45.0-49.9, adult (HCC) [E66.01, Z68.42] 06/06/2016    Past Medical History:  Past Medical History:  Diagnosis Date  . Anxiety   . Depression   . OCD (obsessive compulsive disorder)   . Panic attacks     Past Surgical History:  Procedure Laterality Date  . NO PAST SURGERIES    . none     Family History:  Family History  Problem Relation Age of Onset  . Diabetes Mother   . Hypertension Mother   . Hypertension Sister   . Stroke Maternal Grandfather    Social History:  History  Alcohol Use No     History  Drug Use No    Social History   Social History  . Marital status: Single    Spouse name: N/A  . Number of children: N/A  . Years of education: N/A   Social History Main Topics  . Smoking status: Never Smoker  . Smokeless tobacco: Never Used  . Alcohol use No  . Drug use: No  . Sexual activity: Yes    Partners: Male    Birth control/ protection: None  Comment: nexplanon taken out about 2 months before she was pregnant   Other Topics Concern  . None   Social History Narrative  . None    Hospital Course:    Latasha Alexander is a 26 year old pregnant female with a history of depression and anxiety admitted for suicidal ideation in the context of treatment discontinuation.  1. Suicidal ideation. Resolved. The patient is able to contract for safety. She  is forward thinking and optimistic about the future. She is a loving mother and daughter.  2. Mood. She had been maintained on a combination of Zoloft, Abilify, and trazodone for depression, OCD and mood stability until 3 weeks ago when she realized she were pregnant. She was restarted on the Zoloft. We substituted Abilify with Latuda.   3. Insomnia. She slept well with Melatonin.  4. Pregnancy. She is in the care of an obstetrician. We continued prenatal vitamins, folic acid, and phenergan for nausea.  5. Metabolic syndrome monitoring. Lipid panel, TSH and hemoglobin A1c are normal.   6. EKG. Normal sinus rhythm.  QTC 452.   8. Disposition. She will be discharged to home with her parents. She will follow up with [facility].  Physical Findings: AIMS: Facial and Oral Movements Muscles of Facial Expression: None, normal Lips and Perioral Area: None, normal Jaw: None, normal Tongue: None, normal,Extremity Movements Upper (arms, wrists, hands, fingers): None, normal Lower (legs, knees, ankles, toes): None, normal, Trunk Movements Neck, shoulders, hips: None, normal, Overall Severity Severity of abnormal movements (highest score from questions above): None, normal Incapacitation due to abnormal movements: None, normal Patient's awareness of abnormal movements (rate only patient's report): No Awareness, Dental Status Current problems with teeth and/or dentures?: No Does patient usually wear dentures?: No  CIWA:    COWS:     Musculoskeletal: Strength & Muscle Tone: within normal limits Gait & Station: normal Patient leans: N/A  Psychiatric Specialty Exam: Physical Exam  Nursing note and vitals reviewed. Psychiatric: Her speech is normal and behavior is normal. Judgment and thought content normal. Her mood appears anxious. Cognition and memory are normal.    Review of Systems  Psychiatric/Behavioral: The patient is nervous/anxious.   All other systems reviewed and are  negative.   Blood pressure 124/72, pulse 75, temperature 98.2 F (36.8 C), temperature source Oral, resp. rate 18, height 5\' 5"  (1.651 m), weight 133.4 kg (294 lb), last menstrual period 04/18/2016.Body mass index is 48.92 kg/m.  General Appearance: Casual  Eye Contact:  Good  Speech:  Clear and Coherent  Volume:  Normal  Mood:  Anxious  Affect:  Appropriate  Thought Process:  Goal Directed and Descriptions of Associations: Intact  Orientation:  Full (Time, Place, and Person)  Thought Content:  WDL  Suicidal Thoughts:  No  Homicidal Thoughts:  No  Memory:  Immediate;   Fair Recent;   Fair Remote;   Fair  Judgement:  Fair  Insight:  Fair  Psychomotor Activity:  Normal  Concentration:  Concentration: Fair and Attention Span: Fair  Recall:  [employer] of Knowledge:  Fair  Language:  Fair  Akathisia:  No  Handed:  Right  AIMS (if indicated):     Assets:  Communication Skills Desire for Improvement Financial Resources/Insurance Housing Physical Health Resilience Social Support  ADL's:  Intact  Cognition:  WNL  Sleep:  Number of Hours: 7.75     Have you used any form of tobacco in the last 30 days? (Cigarettes, Smokeless Tobacco, Cigars, and/or Pipes): No  Has this  patient used any form of tobacco in the last 30 days? (Cigarettes, Smokeless Tobacco, Cigars, and/or Pipes) Yes, No  Blood Alcohol level:  Lab Results  Component Value Date   ETH <5 07/16/2016    Metabolic Disorder Labs:  Lab Results  Component Value Date   HGBA1C 5.5 07/18/2016   MPG 111 07/18/2016   No results found for: PROLACTIN Lab Results  Component Value Date   CHOL 181 07/18/2016   TRIG 102 07/18/2016   HDL 63 07/18/2016   CHOLHDL 2.9 07/18/2016   VLDL 20 07/18/2016   LDLCALC 98 07/18/2016    See Psychiatric Specialty Exam and Suicide Risk Assessment completed by Attending Physician prior to discharge.  Discharge destination:  Home  Is patient on multiple antipsychotic therapies at  discharge:  No   Has Patient had three or more failed trials of antipsychotic monotherapy by history:  No  Recommended Plan for Multiple Antipsychotic Therapies: NA  Discharge Instructions    Diet - low sodium heart healthy    Complete by:  As directed    Increase activity slowly    Complete by:  As directed      Allergies as of 07/19/2016      Reactions   Cashew Nut Oil Swelling      Medication List    TAKE these medications     Indication  folic acid 1 MG tablet Commonly known as:  FOLVITE Take 2 tablets (2 mg total) by mouth daily. Start taking on:  07/20/2016  Indication:  Birth Defects of the Nervous System   lurasidone 40 MG Tabs tablet Commonly known as:  LATUDA Take 1 tablet (40 mg total) by mouth daily with supper.  Indication:  Depressive Phase of Manic-Depression   Melatonin 5 MG Tabs Take 1 tablet (5 mg total) by mouth at bedtime.  Indication:  Trouble Sleeping   multivitamin-prenatal 27-0.8 MG Tabs tablet Take 1 tablet by mouth daily at 12 noon.  Indication:  Pregnancy   promethazine 12.5 MG tablet Commonly known as:  PHENERGAN Take 1 tablet (12.5 mg total) by mouth every 6 (six) hours as needed for nausea or vomiting.  Indication:  Nausea and Vomiting in Pregnancy   sertraline 25 MG tablet Commonly known as:  ZOLOFT Take 5 tablets (125 mg total) by mouth daily. Start taking on:  07/20/2016 What changed:  medication strength  how much to take  Indication:  Major Depressive Disorder      Follow-up Information    [facility] Follow up on 07/24/2016.   Why:  Follow-up appointment on this date with your psychiatrist and therapist at 2:00pm. Bring insurance card, current medications, and discharge summary with you to this appointment.  Contact information: [address] [place], [place] [address] [phone] [phone]           Follow-up recommendations:  Activity:  as tolerated. Diet:  regular. Other:  keep  follow up appointments.  Comments:    Signed: Kristine Linea, MD 07/19/2016, 12:38 PM

## 2016-07-19 NOTE — Progress Notes (Signed)
D: Pt denies SI/HI/AVH. Pt is pleasant and cooperative, affect is flat and sad. Pt appears less anxious and she is not interacting with peers and staff appropriately.  A: Pt was offered support and encouragement. Pt was given scheduled medications. Pt was encouraged to attend groups. Q 15 minute checks were done for safety.  R:Pt did not attends evening group. Patient is not interacting with peers and staff. Pt is taking medication. Pt has no complaints.Pt receptive to treatment and safety maintained on unit.

## 2016-07-19 NOTE — Discharge Summary (Deleted)
Physician Discharge Summary Note  Patient:  Latasha Alexander is an 26 y.o., female MRN:  [id number] DOB:  23-Mar-1991 Patient phone:  [phone] (home)  Patient address:   [address] [place] [address],  Total Time spent with patient: 30 minutes  Date of Admission:  07/17/2016 Date of Discharge: 07/19/2016  Reason for Admission:  Suicidal ideation.  Identifying data. Latasha Alexander is a 26 year old pregnant female with history of depression and anxiety.  Chief complaint. "I have intrusive thoughts of suicide."  History of present illness. Information was obtained from the patient and the chart. The patient has a long history of depression anxiety of OCD type and mood instability. She has been stable on a combination of Abilify, Zoloft, and trazodone prescribed by Dr. Lourdes Sledge at [facility]. 3 weeks ago after she realized that she is pregnant she consulted Dr. Lourdes Sledge who recommended that she stops all her medications. Unfortunately the patient became increasingly depressed, anxious, experiencing periods of derealization, intrusive thoughts of suicide and also thoughts about terminating her pregnancy. She believes that OCD and intrusive thoughts as well as derealization are much more difficult to deal with her depression. She has been very happy with the effects of Abilify and is looking forward to find another medication that his stimuli but can be used in pregnancy. She denies psychotic symptoms or symptoms suggestive of bipolar mania. There is no substance use.  Past psychiatric history. She has been hospitalized once in 2014 for worsening of depression and anxiety. She never attempted suicide. She sees Dr. Lourdes Sledge and her therapist regularly.  Family psychiatric history. None reported.  Social history. She lives with her parents. 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 She no longer is with the father of the baby but has not seen upset about it. She has excellent  support at home.  Principal Problem: Severe recurrent major depression without psychotic features [facility]) Discharge Diagnoses: Patient Active Problem List   Diagnosis Date Noted  . OCD (obsessive compulsive disorder) [F42.9] 07/17/2016  . Severe recurrent major depression without psychotic features (HCC) [F33.2] 07/16/2016  . Pregnant [Z34.90] 07/16/2016  . Anxiety and depression [F41.8] 07/08/2016  . Nausea and vomiting during pregnancy [O21.9] 07/08/2016  . Urinary symptom or sign [R39.9] 07/08/2016  . Supervision of high risk pregnancy in first trimester [O09.91] 07/08/2016  . Morbid obesity with BMI of 45.0-49.9, adult (HCC) [E66.01, Z68.42] 06/06/2016   Past Medical History:  Past Medical History:  Diagnosis Date  . Anxiety   . Depression   . OCD (obsessive compulsive disorder)   . Panic attacks     Past Surgical History:  Procedure Laterality Date  . NO PAST SURGERIES    . none     Family History:  Family History  Problem Relation Age of Onset  . Diabetes Mother   . Hypertension Mother   . Hypertension Sister   . Stroke Maternal Grandfather     Social History:  History  Alcohol Use No     History  Drug Use No    Social History   Social History  . Marital status: Single    Spouse name: N/A  . Number of children: N/A  . Years of education: N/A   Social History Main Topics  . Smoking status: Never Smoker  . Smokeless tobacco: Never Used  . Alcohol use No  . Drug use: No  . Sexual activity: Yes    Partners: Male    Birth control/ protection: None  Comment: nexplanon taken out about 2 months before she was pregnant   Other Topics Concern  . None   Social History Narrative  . None    Hospital Course:    Latasha Alexander is a 26 year old pregnant female with a history of depression and anxiety admitted for suicidal ideation in the context of treatment discontinuation.  1. Suicidal ideation. Resolved. The patient is able to contract for safety. She  is forward thinking and optimistic about the future. She is a loving mother and daughter.  2. Mood. She had been maintained on a combination of Zoloft, Abilify, and trazodone for depression, OCD and mood stability until 3 weeks ago when she realized she were pregnant. She was restarted on the Zoloft. We substituted Abilify with Latuda.   3. Insomnia. She slept well with Melatonin.  4. Pregnancy. She is in the care of an obstetrician. We continued prenatal vitamins, folic acid, and phenergan for nausea.  5. Metabolic syndrome monitoring. Lipid panel, TSH and hemoglobin A1c are normal.   6. EKG. Normal sinus rhythm.  QTC 452.   8. Disposition. She will be discharged to home with her parents. She will follow up with [facility].  Physical Findings: AIMS: Facial and Oral Movements Muscles of Facial Expression: None, normal Lips and Perioral Area: None, normal Jaw: None, normal Tongue: None, normal,Extremity Movements Upper (arms, wrists, hands, fingers): None, normal Lower (legs, knees, ankles, toes): None, normal, Trunk Movements Neck, shoulders, hips: None, normal, Overall Severity Severity of abnormal movements (highest score from questions above): None, normal Incapacitation due to abnormal movements: None, normal Patient's awareness of abnormal movements (rate only patient's report): No Awareness, Dental Status Current problems with teeth and/or dentures?: No Does patient usually wear dentures?: No  CIWA:    COWS:     Musculoskeletal: Strength & Muscle Tone: within normal limits Gait & Station: normal Patient leans: N/A  Psychiatric Specialty Exam: Physical Exam  Nursing note and vitals reviewed.   Review of Systems  Psychiatric/Behavioral: The patient is nervous/anxious.   All other systems reviewed and are negative.   Blood pressure 124/72, pulse 75, temperature 98.2 F (36.8 C), temperature source Oral, resp. rate 18, height 5\' 5"  (1.651 m), weight 133.4 kg (294 lb),  last menstrual period 04/18/2016.Body mass index is 48.92 kg/m.  General Appearance: Casual  Eye Contact:  Good  Speech:  Clear and Coherent  Volume:  Normal  Mood:  Anxious  Affect:  Appropriate  Thought Process:  Goal Directed and Descriptions of Associations: Intact  Orientation:  Full (Time, Place, and Person)  Thought Content:  WDL  Suicidal Thoughts:  No  Homicidal Thoughts:  No  Memory:  Immediate;   Fair Recent;   Fair Remote;   Fair  Judgement:  Fair  Insight:  Fair  Psychomotor Activity:  Normal  Concentration:  Concentration: Fair and Attention Span: Fair  Recall:  [employer]Fair  Fund of Knowledge:  Fair  Language:  Fair  Akathisia:  No  Handed:  Right  AIMS (if indicated):     Assets:  Communication Skills Desire for Improvement Financial Resources/Insurance Housing Physical Health Resilience Social Support  ADL's:  Intact  Cognition:  WNL  Sleep:  Number of Hours: 7.75     Have you used any form of tobacco in the last 30 days? (Cigarettes, Smokeless Tobacco, Cigars, and/or Pipes): No  Has this patient used any form of tobacco in the last 30 days? (Cigarettes, Smokeless Tobacco, Cigars, and/or Pipes) Yes, No  Blood Alcohol level:  Lab Results  Component Value Date   ETH <5 07/16/2016    Metabolic Disorder Labs:  Lab Results  Component Value Date   HGBA1C 5.5 07/18/2016   MPG 111 07/18/2016   No results found for: PROLACTIN Lab Results  Component Value Date   CHOL 181 07/18/2016   TRIG 102 07/18/2016   HDL 63 07/18/2016   CHOLHDL 2.9 07/18/2016   VLDL 20 07/18/2016   LDLCALC 98 07/18/2016    See Psychiatric Specialty Exam and Suicide Risk Assessment completed by Attending Physician prior to discharge.  Discharge destination:  Home  Is patient on multiple antipsychotic therapies at discharge:  No   Has Patient had three or more failed trials of antipsychotic monotherapy by history:  No  Recommended Plan for Multiple Antipsychotic  Therapies: NA  Discharge Instructions    Diet - low sodium heart healthy    Complete by:  As directed    Increase activity slowly    Complete by:  As directed         Follow-up recommendations:  Activity:  As tolerated. Diet:  Regular. Other:  Keep follow-up appointments.  Comments:    Signed: Kristine Linea, MD 07/19/2016, 9:21 AM

## 2016-07-19 NOTE — Progress Notes (Signed)
Recreation Therapy Notes  Date: 01.25.18 Time: 1:00 pm Location: Craft Room  Group Topic: Leisure Education  Goal Area(s) Addresses:  Patient will identify activities for each letter of the alphabet. Patient will verbalize ability to integrate positive leisure into life post d/c. Patient will verbalize ability to use leisure as a Associate Professorcoping skill.  Behavioral Response: Attentive, Interactive  Intervention: Leisure Alphabet  Activity: Patients were given a Leisure Information systems managerAlphabet worksheet and were instructed to write healthy leisure activities for each letter of the alphabet.  Education: LRT educated patients on what they need to participate in leisure.  Education Outcome: Acknowledges education/In group clarification offered  Clinical Observations/Feedback: Patient wrote healthy leisure activities. Patient contributed to group discussion by stating some of her healthy leisure activities.  Jacquelynn CreeGreene,Trichelle Lehan M, LRT/CTRS 07/19/2016 2:01 PM

## 2016-07-19 NOTE — [facility] Group Notes (Signed)
Goals Group Date/Time: 07/19/2016 9:00 AM Type of Therapy and Topic: Group Therapy: Goals Group: SMART Goals   Participation Level: Moderate  Description of Group:    The purpose of a daily goals group is to assist and guide patients in setting recovery/wellness-related goals. The objective is to set goals as they relate to the crisis in which they were admitted. Patients will be using SMART goal modalities to set measurable goals. Characteristics of realistic goals will be discussed and patients will be assisted in setting and processing how one will reach their goal. Facilitator will also assist patients in applying interventions and coping skills learned in psycho-education groups to the SMART goal and process how one will achieve defined goal.   Therapeutic Goals:   -Patients will develop and document one goal related to or their crisis in which brought them into treatment.  -Patients will be guided by LCSW using SMART goal setting modality in how to set a measurable, attainable, realistic and time sensitive goal.  -Patients will process barriers in reaching goal.  -Patients will process interventions in how to overcome and successful in reaching goal.   Patient's Goal: Continue taking my medicines and finalize my discharge plan.   Therapeutic Modalities:  Motivational Interviewing  Research officer, political partyCognitive Behavioral Therapy  Crisis Intervention Model  SMART goals setting   Daleen SquibbGreg Zachery Niswander, [place]

## 2016-07-19 NOTE — [facility] Group Notes (Addendum)
[facility] LCSW Group Therapy  07/19/2016 1:30 PM (Note Entry for [name], LCSWA)   Type of Therapy:  Group Therapy  Participation Level:  Active  Participation Quality:  Appropriate  Affect:  Appropriate  Cognitive:  Alert  Insight:  Developing/Improving  Engagement in Therapy:  Engaged  Modes of Intervention:  Activity, Discussion, Education, Problem-solving, Reality Testing and Support  Summary of Progress/Problems: Balance in life: Patients will discuss the concept of balance and how it looks and feels to be unbalanced. Pt will identify areas in their life that is unbalanced and ways to become more balanced. They discussed what aspects in their lives has influenced their self care. Patients also discussed self care in the areas of self regulation/control, hygiene/appearance, sleep/relaxation, healthy leisure, healthy eating habits, exercise, inner peace/spirituality, self improvement, sobriety, and health management. They were challenged to identify changes that are needed in order to improve self care. Patient discussed areas in her life that needed improvement and balance. These areas include her personal relationships and talking with professionals in therapy.   Scribe for Group Therapy Note Ilma Achee G. Garnette CzechSampson MSW, LCSWA 07/19/2016, 1:35 PM

## 2016-07-19 NOTE — Progress Notes (Signed)
Recreation Therapy Notes  INPATIENT RECREATION TR PLAN  Patient Details Name: Latasha Alexander MRN: [id number] DOB: 1991-06-12 Today's Date: 07/19/2016  Rec Therapy Plan Is patient appropriate for Therapeutic Recreation?: Yes Treatment times per week: At least once a week TR Treatment/Interventions: 1:1 session, Group participation (Comment) (Appropriate participation in daily recreational therapy tx)  Discharge Criteria Pt will be discharged from therapy if:: Treatment goals are met, Discharged Treatment plan/goals/alternatives discussed and agreed upon by:: Patient/family  Discharge Summary Short term goals set: See Care Plan Short term goals met: Complete Progress toward goals comments: One-to-one attended Which groups?: Leisure education, Self-esteem One-to-one attended: Self-esteem, stress management Reason goals not met: N/A Therapeutic equipment acquired: None Reason patient discharged from therapy: Discharge from hospital Pt/family agrees with progress & goals achieved: Yes Date patient discharged from therapy: 07/19/16   Leonette Monarch, LRT/CTRS 07/19/2016, 3:00 PM

## 2016-07-19 NOTE — Tx Team (Signed)
Interdisciplinary Treatment and Diagnostic Plan Update  07/19/2016 Time of Session: 10:30am Latasha Alexander MRN: [id number]  Principal Diagnosis: Severe recurrent major depression without psychotic features [facility](HCC)  Secondary Diagnoses: Principal Problem:   Severe recurrent major depression without psychotic features (HCC) Active Problems:   Morbid obesity with BMI of 45.0-49.9, adult (HCC)   Pregnant   OCD (obsessive compulsive disorder)   Current Medications:  Current Facility-Administered Medications  Medication Dose Route Frequency Provider Last Rate Last Dose  . acetaminophen (TYLENOL) tablet 650 mg  650 mg Oral Q6H PRN Audery AmelJohn T Clapacs, MD      . alum & mag hydroxide-simeth (MAALOX/MYLANTA) 200-200-20 MG/5ML suspension 30 mL  30 mL Oral Q4H PRN Audery AmelJohn T Clapacs, MD      . folic acid (FOLVITE) tablet 2 mg  2 mg Oral Daily Shari ProwsJolanta B Pucilowska, MD   2 mg at 07/19/16 0824  . lurasidone (LATUDA) tablet 40 mg  40 mg Oral Q supper Shari ProwsJolanta B Pucilowska, MD   40 mg at 07/18/16 1650  . magnesium hydroxide (MILK OF MAGNESIA) suspension 30 mL  30 mL Oral Daily PRN Audery AmelJohn T Clapacs, MD      . Melatonin TABS 5 mg  5 mg Oral QHS Shari ProwsJolanta B Pucilowska, MD   5 mg at 07/18/16 2204  . prenatal multivitamin tablet 1 tablet  1 tablet Oral Daily Audery AmelJohn T Clapacs, MD   1 tablet at 07/19/16 [phone]  . promethazine (PHENERGAN) tablet 12.5 mg  12.5 mg Oral Q6H PRN Audery AmelJohn T Clapacs, MD      . Melene Muller[START ON 07/20/2016] sertraline (ZOLOFT) tablet 125 mg  125 mg Oral Daily Jolanta B Pucilowska, MD       PTA Medications: Prescriptions Prior to Admission  Medication Sig Dispense Refill Last Dose  . Prenatal Vit-Fe Fumarate-FA (MULTIVITAMIN-PRENATAL) 27-0.8 MG TABS tablet Take 1 tablet by mouth daily at 12 noon.   Taking  . promethazine (PHENERGAN) 12.5 MG tablet Take 1 tablet (12.5 mg total) by mouth every 6 (six) hours as needed for nausea or vomiting. 30 tablet 2   . sertraline (ZOLOFT) 100 MG tablet Take 100 mg by mouth  daily.   Taking    Patient Stressors: Loss of relationship Medication change or noncompliance  Patient Strengths: Ability for insight Average or above average intelligence Communication skills General fund of knowledge Motivation for treatment/growth Supportive family/friends  Treatment Modalities: Medication Management, Group therapy, Case management,  1 to 1 session with clinician, Psychoeducation, Recreational therapy.   Physician Treatment Plan for Primary Diagnosis: Severe recurrent major depression without psychotic features (HCC) Long Term Goal(s): Improvement in symptoms so as ready for discharge NA   Short Term Goals: Ability to identify changes in lifestyle to reduce recurrence of condition will improve Ability to verbalize feelings will improve Ability to disclose and discuss suicidal ideas Ability to demonstrate self-control will improve Ability to identify and develop effective coping behaviors will improve Ability to maintain clinical measurements within normal limits will improve Compliance with prescribed medications will improve Ability to identify triggers associated with substance abuse/mental health issues will improve NA  Medication Management: Evaluate patient's response, side effects, and tolerance of medication regimen.  Therapeutic Interventions: 1 to 1 sessions, Unit Group sessions and Medication administration.  Evaluation of Outcomes: Adequate for Discharge  Physician Treatment Plan for Secondary Diagnosis: Principal Problem:   Severe recurrent major depression without psychotic features (HCC) Active Problems:   Morbid obesity with BMI of 45.0-49.9, adult [facility](HCC)   Pregnant   OCD (  obsessive compulsive disorder)  Long Term Goal(s): Improvement in symptoms so as ready for discharge NA   Short Term Goals: Ability to identify changes in lifestyle to reduce recurrence of condition will improve Ability to verbalize feelings will improve Ability to  disclose and discuss suicidal ideas Ability to demonstrate self-control will improve Ability to identify and develop effective coping behaviors will improve Ability to maintain clinical measurements within normal limits will improve Compliance with prescribed medications will improve Ability to identify triggers associated with substance abuse/mental health issues will improve NA     Medication Management: Evaluate patient's response, side effects, and tolerance of medication regimen.  Therapeutic Interventions: 1 to 1 sessions, Unit Group sessions and Medication administration.  Evaluation of Outcomes: Adequate for Discharge   RN Treatment Plan for Primary Diagnosis: Severe recurrent major depression without psychotic features (HCC) Long Term Goal(s): Knowledge of disease and therapeutic regimen to maintain health will improve  Short Term Goals: Ability to verbalize frustration and anger appropriately will improve, Ability to verbalize feelings will improve, Ability to disclose and discuss suicidal ideas and Ability to identify and develop effective coping behaviors will improve  Medication Management: RN will administer medications as ordered by provider, will assess and evaluate patient's response and provide education to patient for prescribed medication. RN will report any adverse and/or side effects to prescribing provider.  Therapeutic Interventions: 1 on 1 counseling sessions, Psychoeducation, Medication administration, Evaluate responses to treatment, Monitor vital signs and CBGs as ordered, Perform/monitor CIWA, COWS, AIMS and Fall Risk screenings as ordered, Perform wound care treatments as ordered.  Evaluation of Outcomes: Adequate for Discharge   LCSW Treatment Plan for Primary Diagnosis: Severe recurrent major depression without psychotic features (HCC) Long Term Goal(s): Safe transition to appropriate next level of care at discharge, Engage patient in therapeutic group  addressing interpersonal concerns.  Short Term Goals: Engage patient in aftercare planning with referrals and resources, Increase ability to appropriately verbalize feelings, Increase emotional regulation, Facilitate acceptance of mental health diagnosis and concerns and Increase skills for wellness and recovery  Therapeutic Interventions: Assess for all discharge needs, 1 to 1 time with Social worker, Explore available resources and support systems, Assess for adequacy in community support network, Educate family and significant other(s) on suicide prevention, Complete Psychosocial Assessment, Interpersonal group therapy.  Evaluation of Outcomes: Adequate for Discharge   Progress in Treatment: Attending groups: Yes. Participating in groups: Yes. Taking medication as prescribed: Yes. Toleration medication: Yes. Family/Significant other contact made: CSW made contact attempts to partents, documented. Patient understands diagnosis: Yes. Discussing patient identified problems/goals with staff: Yes. Medical problems stabilized or resolved: Yes. Denies suicidal/homicidal ideation: Yes. Issues/concerns per patient self-inventory: No. Other: n/a  New problem(s) identified: None identified at this time.   New Short Term/Long Term Goal(s): None identified at this time.   Discharge Plan or Barriers: Patient will discharge home with parents and follow-up with [facility] in  [place].   Reason for Continuation of Hospitalization: Anticipated discharge 07/19/2016  Estimated Length of Stay: Anticipated discharge 07/19/2016  Attendees: Patient:Latasha Alexander 07/19/2016 11:35 AM  Physician: Dr. Kristine Linea, MD 07/19/2016 11:35 AM  Nursing: Hulan Amato, RN 07/19/2016 11:35 AM  RN Care Manager: 07/19/2016 11:35 AM  Social Worker: Clementeen Hoof. Troy Sine 07/19/2016 11:35 AM  Recreational Therapist: Jacquelynn Cree, LRT/CTRS 07/19/2016 11:35 AM  Other:  07/19/2016 11:35 AM  Other:   07/19/2016 11:35 AM  Other: 07/19/2016 11:35 AM    Scribe for Treatment Team: Arelia Longest, LCSWA 07/19/2016  11:40 AM

## 2016-07-19 NOTE — Progress Notes (Signed)
D:Patient aware of discharge this shift . Patient returning home . Patient received all belonging locked up . Patient denies  Suicidal  And homicidal ideations  .  A: Writer instructed on discharge criteria  . Informed Discharge [place] Transitional Record Suicidal Risk Assessment  FL2 and prescriptions  given to patient. Aware  Of follow up appointment . R: Patient left unit with no questions  Or concerns  With family

## 2016-07-19 NOTE — Progress Notes (Signed)
  [facility] :  Will you be returning to the same living situation after discharge:  Yes,  with parents At discharge, do you have transportation home?: Yes,  parents Do you have the ability to pay for your medications: Yes,  patient has private insurance  Release of information consent forms completed and in the chart;  Patient's signature needed at discharge.  Patient to Follow up at: Follow-up Information    [facility] Follow up on 07/24/2016.   Why:  Follow-up appointment on this date with your psychiatrist and therapist at 2:00pm. Bring insurance card, current medications, and discharge summary with you to this appointment.  Contact information: [address] [place], [place] [address] P:[phone] F:[phone]           Next level of care provider [facility] Link:no  Safety Planning and Suicide Prevention discussed: Yes,  with patient  Have you used any form of tobacco in the last 30 days? (Cigarettes, Smokeless Tobacco, Cigars, and/or Pipes): No  Has patient been referred to the Quitline?: N/A patient is not a smoker  Patient has been referred for addiction treatment: Yes  Fidencia Mccloud G. Garnette CzechSampson MSW, LCSWA 07/19/2016, 10:14 AM

## 2016-07-19 NOTE — Telephone Encounter (Signed)
Pt calls back, informed her of negative results and female fetus.

## 2016-07-19 NOTE — [facility] Suicide Risk Assessment (Signed)
[facility]   Principal Problem: Severe recurrent major depression without psychotic features [facility](HCC) Discharge Diagnoses:  Patient Active Problem List   Diagnosis Date Noted  . OCD (obsessive compulsive disorder) [F42.9] 07/17/2016  . Severe recurrent major depression without psychotic features (HCC) [F33.2] 07/16/2016  . Pregnant [Z34.90] 07/16/2016  . Anxiety and depression [F41.8] 07/08/2016  . Nausea and vomiting during pregnancy [O21.9] 07/08/2016  . Urinary symptom or sign [R39.9] 07/08/2016  . Supervision of high risk pregnancy in first trimester [O09.91] 07/08/2016  . Morbid obesity with BMI of 45.0-49.9, adult (HCC) [E66.01, Z68.42] 06/06/2016    Total Time spent with patient: 30 minutes  Musculoskeletal: Strength & Muscle Tone: within normal limits Gait & Station: normal Patient leans: N/A  Psychiatric Specialty Exam: Review of Systems  Psychiatric/Behavioral: The patient is nervous/anxious.   All other systems reviewed and are negative.   Blood pressure 124/72, pulse 75, temperature 98.2 F (36.8 C), temperature source Oral, resp. rate 18, height 5\' 5"  (1.651 m), weight 133.4 kg (294 lb), last menstrual period 04/18/2016.Body mass index is 48.92 kg/m.  General Appearance: Casual  Eye Contact::  Good  Speech:  Clear and Coherent409  Volume:  Normal  Mood:  Anxious  Affect:  Appropriate  Thought Process:  Goal Directed and Descriptions of Associations: Intact  Orientation:  Full (Time, Place, and Person)  Thought Content:  WDL  Suicidal Thoughts:  No  Homicidal Thoughts:  No  Memory:  Immediate;   Fair Recent;   Fair Remote;   Fair  Judgement:  Fair  Insight:  Fair  Psychomotor Activity:  Normal  Concentration:  Fair  Recall:  F[employer]Fair  Fund of Knowledge:Fair  Language: Fair  Akathisia:  No  Handed:  Right  AIMS (if indicated):     Assets:  Communication Skills Desire for Improvement Financial  Resources/Insurance Housing Physical Health Resilience Social Support  Sleep:  Number of Hours: 7.75  Cognition: WNL  ADL's:  Intact   Mental Status Per Nursing Assessment::   On Admission:     Demographic Factors:  Adolescent or young adult, Caucasian and Unemployed  Loss Factors: Decline in physical health  Historical Factors: Prior suicide attempts, Family history of mental illness or substance abuse and Impulsivity  Risk Reduction Factors:   Pregnancy, Sense of responsibility to family, Living with another person, especially a relative, Positive social support and Positive therapeutic relationship  Continued Clinical Symptoms:  Depression:   Impulsivity  Cognitive Features That Contribute To Risk:  None    Suicide Risk:  Minimal: No identifiable suicidal ideation.  Patients presenting with no risk factors but with morbid ruminations; may be classified as minimal risk based on the severity of the depressive symptoms    Plan Of Care/Follow-up recommendations:  Activity:  As tolerated. Diet:  Regular. Other:  Keep follow-up appointments.  Kristine LineaJolanta Dhillon [place], MD 07/19/2016, 9:09 AM

## 2016-07-19 NOTE — Plan of Care (Signed)
Problem: [facility] Participation in Recreation Therapeutic Interventions Goal: STG-Patient will demonstrate improved self esteem by identif STG: Self-Esteem - Within 3 treatment sessions, patient will verbalize at least 5 positive affirmation statements in one treatment session to increase self-esteem post d/c.  Outcome: Completed/Met Date Met: 07/19/16 Treatment Session 1; Completed 1 out of 2: At approximately 8:30 am, LRT met with patient in craft room. Patient verbalized 5 positive affirmation statements. Patient reported it felt "good". LRT encouraged patient to continue saying positive affirmation statements.  Leonette Monarch, LRT/CTRS 01.25.18 10:14 am Goal: STG-Other Recreation Therapy Goal (Specify) STG: Stress Management - Within 3 treatment sessions, patient will verbalize understanding of the stress management techniques in one treatment session to increase stress management skills post d/c.  Outcome: Completed/Met Date Met: 07/19/16 Treatment Session 1; Completed 1 out of 1: At approximately 8:30 am, LRT met with patient in craft room. LRT educated and provided patient with handouts on stress management techniques. Patient verbalized understanding. LRT encouraged patient to read over and practice the stress management techniques.  Leonette Monarch, LRT/CTRS  01.25.18 10:15 am

## 2016-07-31 ENCOUNTER — Encounter: Payer: Self-pay | Admitting: Certified Nurse Midwife

## 2016-07-31 ENCOUNTER — Other Ambulatory Visit: Payer: BLUE CROSS/BLUE SHIELD

## 2016-07-31 ENCOUNTER — Encounter: Payer: BLUE CROSS/BLUE SHIELD | Attending: Psychiatry | Admitting: Dietician

## 2016-07-31 ENCOUNTER — Ambulatory Visit (INDEPENDENT_AMBULATORY_CARE_PROVIDER_SITE_OTHER): Payer: BLUE CROSS/BLUE SHIELD | Admitting: Certified Nurse Midwife

## 2016-07-31 ENCOUNTER — Encounter: Payer: BLUE CROSS/BLUE SHIELD | Admitting: Certified Nurse Midwife

## 2016-07-31 ENCOUNTER — Encounter: Payer: Self-pay | Admitting: Dietician

## 2016-07-31 VITALS — Ht 65.0 in | Wt 296.6 lb

## 2016-07-31 VITALS — BP 130/71 | HR 97 | Wt 298.3 lb

## 2016-07-31 DIAGNOSIS — Z131 Encounter for screening for diabetes mellitus: Secondary | ICD-10-CM

## 2016-07-31 DIAGNOSIS — O0993 Supervision of high risk pregnancy, unspecified, third trimester: Secondary | ICD-10-CM

## 2016-07-31 DIAGNOSIS — Z1389 Encounter for screening for other disorder: Secondary | ICD-10-CM

## 2016-07-31 DIAGNOSIS — F411 Generalized anxiety disorder: Secondary | ICD-10-CM | POA: Diagnosis not present

## 2016-07-31 DIAGNOSIS — Z6841 Body Mass Index (BMI) 40.0 and over, adult: Secondary | ICD-10-CM

## 2016-07-31 LAB — POCT URINALYSIS DIPSTICK
Bilirubin, UA: NEGATIVE
Blood, UA: NEGATIVE
Glucose, UA: NEGATIVE
Leukocytes, UA: NEGATIVE
Nitrite, UA: NEGATIVE
Protein, UA: NEGATIVE
SPEC GRAV UA: 1.025
UROBILINOGEN UA: NEGATIVE
pH, UA: 6.5

## 2016-07-31 NOTE — Patient Instructions (Signed)
Continue with 3 meals per day. Include at least 8 oz. Of a protein food daily. Deck of cards size piece of meat is 3 ounces. 2 Tbsp. Peanut butter = 1 oz. 1/4 cup of nuts = 1 oz 1/2 cup of dried beans (pintos for example) = 1 oz. 1 slice of cheese= 1 oz 1/2 cup of chicken salad = 2 oz protein  Include calcium fortified orange juice. Also, yogurt as a calcium source Add [employer] Instant Breakfast to milk  Refer to list (foods/calcium content) given.  Try to include a fruit or vegetable or both with a meal. Add vegetables with the noodles and rice that you like.

## 2016-07-31 NOTE — Progress Notes (Signed)
ROB-Pt doing well. Denies SI/HI. Taking all medications as prescribed. Next appt with [facility] 08/13/2016@1100 . Reviewed red flag symptoms and when to call. Early glucola today. RTC x 4 weeks.

## 2016-07-31 NOTE — Addendum Note (Signed)
Addended by: Lunette StandsSIEMIENSKI, Amira Podolak J on: 07/31/2016 11:24 AM   Modules accepted: Orders

## 2016-07-31 NOTE — Progress Notes (Signed)
Medical Nutrition Therapy Follow-up visit:  Time with patient: 1020-1050 Visit #:2 ASSESSMENT:  Diagnosis: anxiety, OCD, pregnancy  Current weight:296.6 lbs    Height: 65 in Medications: See list  Progress and evaluation: Patient in for medical nutrition therapy follow-up visit. She is now [redacted] weeks gestation. She reports that she presently has no nausea as before. She reports that on most days she has a good appetite. She is now able to eat more meat and followed through with drinking [employer] Instant Breakfast when she is not hungry for a meal. She eats very little fruit and is drinking 3 cups of juice daily. She drinks at least 2 cups of soda daily and 6-8 cups of water. She gave the following food frequency: Milk/dairy- cheese-1-2 oz per day, yogurt-3-4 x/week       Milk with carnation instant breakfast-                    1-2x/week        Vegetables: 0-1 servings daily        Fruit:- usually none; drinks 2 cups of juice   daily         Meat: 3-4 oz. Daily Patient's diet and nutrient intake has improved but continues to be possibly low in protein and is low in fruits and vegetables and calcium. Today's weight is 1.4 lbs less than weight 1 month ago.   NUTRITION CARE EDUCATION: Prenatal nutrition: Reviewed food group servings needed to meet basic nutrient needs. Placed emphasis on meeting protein and calcium needs. Gave and reviewed list of protein foods and calcium content of foods/beverages. Discussed practical ways to include in her diet based on her food preferences.   INTERVENTION:  Continue with 3 meals per day. Include at least 8 oz. Of a protein food daily. Deck of cards size piece of meat is 3 ounces. 2 Tbsp. Peanut butter = 1 oz. 1/4 cup of nuts = 1 oz 1/2 cup of dried beans (pintos for example) = 1 oz. 1 slice of cheese= 1 oz 1/2 cup of chicken salad = 2 oz protein  Include calcium fortified orange juice. Also, yogurt as a calcium source Add [employer] Instant  Breakfast to milk  Refer to list (foods/calcium content) given.  Try to include a fruit or vegetable or both with a meal. Add vegetables with the noodles and rice that you like.  EDUCATION MATERIALS GIVEN:  . List of protein foods . List of foods and calcium content . Goals/ instructions  LEARNER/ who was taught:  . Patient  LEVEL OF UNDERSTANDING: . Partial understanding; needs review/ practice LEARNING BARRIERS: . None WILLINGNESS TO LEARN/READINESS FOR CHANGE: Change in progress MONITORING AND EVALUATION:  Diet, weight Follow-up- 09/11/16 at 10:30am

## 2016-07-31 NOTE — Progress Notes (Signed)
Pt started on new medication Latuda, since last visit.

## 2016-07-31 NOTE — Patient Instructions (Signed)
Second Trimester of Pregnancy The second trimester is from week 13 through week 28, month 4 through 6. This is often the time in pregnancy that you feel your best. Often times, morning sickness has lessened or quit. You may have more energy, and you may get hungry more often. Your unborn baby (fetus) is growing rapidly. At the end of the sixth month, he or she is about 9 inches long and weighs about 1 pounds. You will likely feel the baby move (quickening) between 18 and 20 weeks of pregnancy. Follow these instructions at home:  Avoid all smoking, herbs, and alcohol. Avoid drugs not approved by your doctor.  Do not use any tobacco products, including cigarettes, chewing tobacco, and electronic cigarettes. If you need help quitting, ask your doctor. You may get counseling or other support to help you quit.  Only take medicine as told by your doctor. Some medicines are safe and some are not during pregnancy.  Exercise only as told by your doctor. Stop exercising if you start having cramps.  Eat regular, healthy meals.  Wear a good support bra if your breasts are tender.  Do not use hot tubs, steam rooms, or saunas.  Wear your seat belt when driving.  Avoid raw meat, uncooked cheese, and liter boxes and soil used by cats.  Take your prenatal vitamins.  Take 1500-2000 milligrams of calcium daily starting at the 20th week of pregnancy until you deliver your baby.  Try taking medicine that helps you poop (stool softener) as needed, and if your doctor approves. Eat more fiber by eating fresh fruit, vegetables, and whole grains. Drink enough fluids to keep your pee (urine) clear or pale yellow.  Take warm water baths (sitz baths) to soothe pain or discomfort caused by hemorrhoids. Use hemorrhoid cream if your doctor approves.  If you have puffy, bulging veins (varicose veins), wear support hose. Raise (elevate) your feet for 15 minutes, 3-4 times a day. Limit salt in your diet.  Avoid heavy  lifting, wear low heals, and sit up straight.  Rest with your legs raised if you have leg cramps or low back pain.  Visit your dentist if you have not gone during your pregnancy. Use a soft toothbrush to brush your teeth. Be gentle when you floss.  You can have sex (intercourse) unless your doctor tells you not to.  Go to your doctor visits. Get help if:  You feel dizzy.  You have mild cramps or pressure in your lower belly (abdomen).  You have a nagging pain in your belly area.  You continue to feel sick to your stomach (nauseous), throw up (vomit), or have watery poop (diarrhea).  You have bad smelling fluid coming from your vagina.  You have pain with peeing (urination). Get help right away if:  You have a fever.  You are leaking fluid from your vagina.  You have spotting or bleeding from your vagina.  You have severe belly cramping or pain.  You lose or gain weight rapidly.  You have trouble catching your breath and have chest pain.  You notice sudden or extreme puffiness (swelling) of your face, hands, ankles, feet, or legs.  You have not felt the baby move in over an hour.  You have severe headaches that do not go away with medicine.  You have vision changes. This information is not intended to replace advice given to you by your health care provider. Make sure you discuss any questions you have with your health care   provider. Document Released: 09/05/2009 Document Revised: 11/17/2015 Document Reviewed: 08/12/2012 Elsevier Interactive Patient Education  2017 [employer]vier Inc.  

## 2016-08-01 ENCOUNTER — Encounter: Payer: BLUE CROSS/BLUE SHIELD | Admitting: Obstetrics and Gynecology

## 2016-08-01 ENCOUNTER — Other Ambulatory Visit: Payer: BLUE CROSS/BLUE SHIELD

## 2016-08-01 LAB — GLUCOSE, 1 HOUR GESTATIONAL: Gestational Diabetes Screen: 182 mg/dL — ABNORMAL HIGH (ref 65–139)

## 2016-08-02 ENCOUNTER — Telehealth: Payer: Self-pay | Admitting: Obstetrics and Gynecology

## 2016-08-02 DIAGNOSIS — R7309 Other abnormal glucose: Secondary | ICD-10-CM

## 2016-08-02 NOTE — Telephone Encounter (Signed)
Pt had 1 hr glucose and said she saw it on mychart but didn't understand. Can you let her know if she passed or not?

## 2016-08-03 NOTE — Telephone Encounter (Signed)
Called pt informed her of the need for 3hr GTT. Pt is very confused about what this means informed that a 1hr GTT is used a screening for GDM, and a 3gr GTT is indicative of a diagnosis. Pt states that she will have to talk to her mom about when she will be able to give her a ride to the office for 3hr. Advised pt of the need to be npo after midnight prior to the day of test. Pt gave verbal understanding.

## 2016-08-09 ENCOUNTER — Encounter: Payer: Self-pay | Admitting: *Deleted

## 2016-08-09 ENCOUNTER — Ambulatory Visit
Admission: EM | Admit: 2016-08-09 | Discharge: 2016-08-09 | Disposition: A | Discharge status: Home / Self Care | Payer: BLUE CROSS/BLUE SHIELD | Attending: Emergency Medicine | Admitting: Emergency Medicine

## 2016-08-09 DIAGNOSIS — J069 Acute upper respiratory infection, unspecified: Secondary | ICD-10-CM

## 2016-08-09 DIAGNOSIS — J029 Acute pharyngitis, unspecified: Secondary | ICD-10-CM | POA: Diagnosis not present

## 2016-08-09 LAB — RAPID STREP SCREEN (MED CTR MEBANE ONLY): Streptococcus, Group A Screen (Direct): NEGATIVE

## 2016-08-09 MED ORDER — FLUTICASONE PROPIONATE 50 MCG/ACT NA SUSP: 2.0000 | Freq: Every day | NASAL | 0 refills | Status: DC

## 2016-08-09 MED ORDER — FLUTICASONE PROPIONATE 50 MCG/ACT NA SUSP
2.0000 | Freq: Every day | NASAL | 0 refills | Status: DC
Start: 2016-08-09 — End: 2016-08-09

## 2016-08-09 NOTE — ED Provider Notes (Signed)
HPI  SUBJECTIVE:  Latasha Alexander is a 26 y.o. female who presents with sore throat, yellowish white nasal congestion, rhinorrhea, postnasal drip, cough productive of the same material as her nasal congestion and fevers Tmax 100 x 2-3 days. She reports a constant right-sided headache for the past 3 days. She has tried Robitussin, Tylenol, Benadryl, Sudafed. There are no aggravating or alleviating factors. She denies sinus pain or pressure, ear pain, voice changes, drooling, trismus, sensation of throat swelling shut. No wheezing, chest pain, shortness of breath. No abdominal pain, rash. No body aches. No allergy type symptoms. No contacts with strep or mono. No antibiotics in the past month. No antipyretic in the past 6-8 hours. She has a past medical history of strep. No history of mono, diabetes, hypertension. LMP: She is [redacted] weeks pregnant. She is a G1 P0. Pregnancy has been confirmed by ultrasound. she states that the pregnancy is going well so far. She has received prenatal care. UJW:[id number]:SANTAYANA, Saintclair HalstedGLORIA PATRICIA, DO   Past Medical History:  Diagnosis Date  . Anxiety   . Depression   . OCD (obsessive compulsive disorder)   . Panic attacks     Past Surgical History:  Procedure Laterality Date  . NO PAST SURGERIES    . none      Family History  Problem Relation Age of Onset  . Diabetes Mother   . Hypertension Mother   . Hypertension Sister   . Stroke Maternal Grandfather     Social History  Substance Use Topics  . Smoking status: Never Smoker  . Smokeless tobacco: Never Used  . Alcohol use No    No current facility-administered medications for this encounter.   Current Outpatient Prescriptions:  .  folic acid (FOLVITE) 1 MG tablet, Take 2 tablets (2 mg total) by mouth daily., Disp: 60 tablet, Rfl: 1 .  lurasidone (LATUDA) 40 MG TABS tablet, Take 1 tablet (40 mg total) by mouth daily with supper., Disp: 30 tablet, Rfl: 1 .  Melatonin 5 MG TABS, Take 1 tablet (5 mg total) by  mouth at bedtime., Disp: 30 tablet, Rfl: 0 .  Prenatal Vit-Fe Fumarate-FA (MULTIVITAMIN-PRENATAL) 27-0.8 MG TABS tablet, Take 1 tablet by mouth daily at 12 noon., Disp: , Rfl:  .  promethazine (PHENERGAN) 12.5 MG tablet, Take 1 tablet (12.5 mg total) by mouth every 6 (six) hours as needed for nausea or vomiting., Disp: 30 tablet, Rfl: 2 .  sertraline (ZOLOFT) 25 MG tablet, Take 5 tablets (125 mg total) by mouth daily., Disp: 150 tablet, Rfl: 1 .  fluticasone (FLONASE) 50 MCG/ACT nasal spray, Place 2 sprays into both nostrils daily., Disp: 16 g, Rfl: 0  Allergies  Allergen Reactions  . Cashew Nut Oil Swelling     ROS  As noted in HPI.   Physical Exam  BP (!) 145/90 (BP Location: Right Wrist)   Pulse 80   Temp 97.5 F (36.4 C) (Oral)   Resp 20   LMP 04/18/2016 (Exact Date) Comment: hx of irregular periods  SpO2 99%   Constitutional: Well developed, well nourished, no acute distress Eyes:  EOMI, conjunctiva normal bilaterally HENT: Normocephalic, atraumatic,mucus membranes moist. Bilateral TMs normal. Clear rhinorrhea with erythematous, swollen turbinates. No sinus tenderness. Positive injected oropharynx with enlarged tonsils. No exudates. Uvula midline. No cobblestoning, postnasal drip. Neck: Positive cervical lymphadenopathy Respiratory: Normal inspiratory effort lungs clear bilaterally  Cardiovascular: Normal rate regular rhythm no murmurs rubs or gallops GI: nondistended. No palpable spleen. skin: No rash, skin intact Musculoskeletal: no  deformities Neurologic: Alert & oriented x 3, no focal neuro deficits Psychiatric: Speech and behavior appropriate   ED Course   Medications - No data to display  Orders Placed This Encounter  Procedures  . Rapid strep screen    Standing Status:   Standing    Number of Occurrences:   1    Order Specific Question:   Patient immune status    Answer:   Normal  . Culture, group A strep    Standing Status:   Standing    Number of  Occurrences:   1    Results for orders placed or performed during the hospital encounter of 08/09/16 (from the past 24 hour(s))  Rapid strep screen     Status: None   Collection Time: 08/09/16 12:51 PM  Result Value Ref Range   Streptococcus, Group A Screen (Direct) NEGATIVE NEGATIVE   No results found.  ED Clinical Impression  Upper respiratory tract infection, unspecified type  Sore throat   ED Assessment/Plan Strep negative. Presentation consistent with a viral URI. Home with Flonase, saline nasal irrigation, discontinue Benadryl, start plain Mucinex, follow up with her primary care physician as needed, to the ER if she gets worse. Discussed labs, MDM, plan and followup with patient. Discussed sn/sx that should prompt return to the ED. Patient agrees with plan.   Meds ordered this encounter  Medications  . DISCONTD: fluticasone (FLONASE) 50 MCG/ACT nasal spray    Sig: Place 2 sprays into both nostrils daily.    Dispense:  16 g    Refill:  0  . fluticasone (FLONASE) 50 MCG/ACT nasal spray    Sig: Place 2 sprays into both nostrils daily.    Dispense:  16 g    Refill:  0    *This clinic note was created using Scientist, clinical (histocompatibility and immunogenetics). Therefore, there may be occasional mistakes despite careful proofreading.  ?   Domenick Gong, MD 08/09/16 2118

## 2016-08-09 NOTE — Discharge Instructions (Addendum)
1 gram of Tylenol up to 4 times a day as needed for pain.start  some plain Mucinex in addition to the Flonase. Stop The Benadryl. Use a neti pot or the NeilMed sinus rinse as often as you want to to reduce nasal congestion. Follow the directions on the box.   Go to www.goodrx.com to look up your medications. This will give you a list of where you can find your prescriptions at the most affordable prices.

## 2016-08-09 NOTE — ED Triage Notes (Signed)
Patient started having symptoms of cough, nasal congestion, fever, and chest congestion 3 days ago.

## 2016-08-12 LAB — CULTURE, GROUP A STREP (THRC)

## 2016-08-14 ENCOUNTER — Other Ambulatory Visit: Payer: Self-pay | Admitting: Obstetrics and Gynecology

## 2016-08-14 ENCOUNTER — Telehealth: Payer: Self-pay | Admitting: Obstetrics and Gynecology

## 2016-08-14 ENCOUNTER — Other Ambulatory Visit: Payer: BLUE CROSS/BLUE SHIELD

## 2016-08-14 ENCOUNTER — Encounter: Payer: Self-pay | Admitting: Obstetrics and Gynecology

## 2016-08-14 NOTE — Telephone Encounter (Signed)
Pt is [redacted] weeks pregnant and is having constant headaches.  She wants to know is there anything she can take besides reg./ tylenol.  Her call back is [phone]  Thank sTeri

## 2016-08-14 NOTE — Telephone Encounter (Signed)
I spoke with pt and she states she has had headache for 1 to 1.5 wks, which comes and goes. Hurts mostly on right side. Taking Tylenol 2-3x day. Has been seen in urgent care a week ago for URI. She has been taking Mucinex daily and Flonase daily. Was to take this until she felt better and pt states she does. Pt to try Tylenol ES 2 tabs as needed for headache and Claritin and if she does not feel better by Thursday, contact office.

## 2016-08-14 NOTE — Telephone Encounter (Signed)
Please advise, Thanks JML.

## 2016-08-15 LAB — GESTATIONAL GLUCOSE TOLERANCE
Glucose, Fasting: 81 mg/dL (ref 65–94)
Glucose, GTT - 1 Hour: 150 mg/dL (ref 65–179)
Glucose, GTT - 2 Hour: 132 mg/dL (ref 65–154)
Glucose, GTT - 3 Hour: 94 mg/dL (ref 65–139)

## 2016-08-16 ENCOUNTER — Telehealth: Payer: Self-pay | Admitting: Obstetrics and Gynecology

## 2016-08-16 NOTE — Telephone Encounter (Signed)
Pt called wanting the results from her glucose test.  Please advise.  Thanks Fortune Brandsteri

## 2016-08-17 ENCOUNTER — Other Ambulatory Visit: Payer: Self-pay | Admitting: Psychiatry

## 2016-08-17 ENCOUNTER — Telehealth: Payer: Self-pay | Admitting: Obstetrics and Gynecology

## 2016-08-17 ENCOUNTER — Encounter: Payer: Self-pay | Admitting: Obstetrics and Gynecology

## 2016-08-17 NOTE — Telephone Encounter (Signed)
Called pt, no answer. Unable to leave message as no voicemail is set up.  

## 2016-08-17 NOTE — Telephone Encounter (Signed)
PT CALLED REQUESTING THE RESULTS OF HER 3 HOUR GLUCOSE, I DID GET THEM IN THE FAX THIS MORNING AND I SCANNED THEM IN HER CHART BUT I SENT THEM TO DR CHERRY TO REVIEW, PT ALSO CHANGED HER NUMBER, I DID UPDATE THAT IN HER DEOMGRAPHINCS

## 2016-08-17 NOTE — Telephone Encounter (Signed)
Pt calls back, informed her of negative 3 hr gtt

## 2016-08-17 NOTE — Telephone Encounter (Signed)
Please see next telephone encounter.

## 2016-08-20 ENCOUNTER — Telehealth: Payer: Self-pay

## 2016-08-20 NOTE — Telephone Encounter (Signed)
See my chart message

## 2016-08-20 NOTE — Telephone Encounter (Signed)
Pt has been having headaches x several weeks.  She has taken the claritin and x strength tylenol but it's not helping.  Her call back is [phone]  Thanks teri

## 2016-08-28 ENCOUNTER — Ambulatory Visit (INDEPENDENT_AMBULATORY_CARE_PROVIDER_SITE_OTHER): Payer: BLUE CROSS/BLUE SHIELD | Admitting: Certified Nurse Midwife

## 2016-08-28 ENCOUNTER — Encounter: Payer: Self-pay | Admitting: Certified Nurse Midwife

## 2016-08-28 VITALS — BP 132/79 | HR 82 | Wt 301.4 lb

## 2016-08-28 DIAGNOSIS — Z79899 Other long term (current) drug therapy: Secondary | ICD-10-CM

## 2016-08-28 DIAGNOSIS — O99212 Obesity complicating pregnancy, second trimester: Secondary | ICD-10-CM

## 2016-08-28 DIAGNOSIS — O0992 Supervision of high risk pregnancy, unspecified, second trimester: Secondary | ICD-10-CM

## 2016-08-28 LAB — POCT URINALYSIS DIPSTICK
Bilirubin, UA: NEGATIVE
Blood, UA: NEGATIVE
GLUCOSE UA: NEGATIVE
Ketones, UA: NEGATIVE
LEUKOCYTES UA: NEGATIVE
NITRITE UA: NEGATIVE
Protein, UA: NEGATIVE
Spec Grav, UA: 1.01
Urobilinogen, UA: NEGATIVE
pH, UA: 6

## 2016-08-28 MED ORDER — ONDANSETRON 4 MG PO TBDP: 4.0000 mg | ORAL_TABLET | Freq: Four times a day (QID) | ORAL | 0 refills | Status: DC | PRN

## 2016-08-28 NOTE — Progress Notes (Signed)
ROB-Pt doing well. Denies SI/HI. [facility]. Currently taking 125 mg Zoloft and 40 mg Latuda daily. [facility] for [facility] and management recommendation. Zofran Rx, see orders. Reviewed red flag symptoms and when to call. Will contact patient to discuss transfer [facility] for high risk care due to obesity and current medications.

## 2016-08-29 ENCOUNTER — Other Ambulatory Visit: Payer: Self-pay | Admitting: Certified Nurse Midwife

## 2016-08-29 DIAGNOSIS — Z3689 Encounter for other specified antenatal screening: Secondary | ICD-10-CM

## 2016-09-05 ENCOUNTER — Encounter: Payer: Self-pay | Admitting: Certified Nurse Midwife

## 2016-09-07 ENCOUNTER — Other Ambulatory Visit: Payer: Self-pay | Admitting: Certified Nurse Midwife

## 2016-09-07 MED ORDER — ONDANSETRON 4 MG PO TBDP: 4.0000 mg | ORAL_TABLET | Freq: Four times a day (QID) | ORAL | 0 refills | Status: DC | PRN

## 2016-09-07 NOTE — Telephone Encounter (Signed)
Patient needs a refill on her Zofran. She uses the [facility] in [place].

## 2016-09-11 ENCOUNTER — Ambulatory Visit: Payer: Self-pay | Admitting: Dietician

## 2016-09-11 ENCOUNTER — Telehealth: Payer: Self-pay

## 2016-09-11 DIAGNOSIS — O99212 Obesity complicating pregnancy, second trimester: Secondary | ICD-10-CM

## 2016-09-11 DIAGNOSIS — IMO0001 Reserved for inherently not codable concepts without codable children: Secondary | ICD-10-CM

## 2016-09-11 NOTE — Telephone Encounter (Signed)
-----   Message from Gunnar BullaJenkins Michelle Lawhorn, CNM sent at 09/10/2016  3:52 PM EDT ----- Would you please contact the patient to let her know that she will need to transfer her care to [facility] or [facility] for elevated BMI. We will place a referral as appropriate. Thanks, JML

## 2016-09-11 NOTE — Telephone Encounter (Signed)
Called pt informed her that per M. Lawhorn she needs a referral to a hospital that is equipped to deliver pts with a BMI greater than 50. Pt states that she would prefer [facility]. Referral placed. Advised pt to keep apll previously scheduled appts until she gets established with [facility].

## 2016-09-13 ENCOUNTER — Telehealth: Payer: Self-pay | Admitting: *Deleted

## 2016-09-13 ENCOUNTER — Ambulatory Visit
Admission: RE | Admit: 2016-09-13 | Discharge: 2016-09-13 | Disposition: A | Discharge status: Home / Self Care | Payer: BLUE CROSS/BLUE SHIELD | Source: Ambulatory Visit | Attending: Maternal and Fetal Medicine | Admitting: Maternal and Fetal Medicine

## 2016-09-13 DIAGNOSIS — F419 Anxiety disorder, unspecified: Secondary | ICD-10-CM | POA: Diagnosis not present

## 2016-09-13 DIAGNOSIS — Z6841 Body Mass Index (BMI) 40.0 and over, adult: Secondary | ICD-10-CM | POA: Diagnosis not present

## 2016-09-13 DIAGNOSIS — E669 Obesity, unspecified: Secondary | ICD-10-CM | POA: Insufficient documentation

## 2016-09-13 DIAGNOSIS — O99212 Obesity complicating pregnancy, second trimester: Secondary | ICD-10-CM | POA: Diagnosis not present

## 2016-09-13 DIAGNOSIS — F429 Obsessive-compulsive disorder, unspecified: Secondary | ICD-10-CM | POA: Diagnosis not present

## 2016-09-13 DIAGNOSIS — Z3689 Encounter for other specified antenatal screening: Secondary | ICD-10-CM | POA: Insufficient documentation

## 2016-09-13 DIAGNOSIS — F329 Major depressive disorder, single episode, unspecified: Secondary | ICD-10-CM | POA: Diagnosis not present

## 2016-09-13 DIAGNOSIS — Z3A21 21 weeks gestation of pregnancy: Secondary | ICD-10-CM | POA: Insufficient documentation

## 2016-09-13 DIAGNOSIS — F332 Major depressive disorder, recurrent severe without psychotic features: Secondary | ICD-10-CM

## 2016-09-13 DIAGNOSIS — O99342 Other mental disorders complicating pregnancy, second trimester: Secondary | ICD-10-CM | POA: Diagnosis not present

## 2016-09-13 DIAGNOSIS — F418 Other specified anxiety disorders: Secondary | ICD-10-CM

## 2016-09-13 NOTE — Telephone Encounter (Signed)
Spoke with Roe Coombson at [facility].   Serafina RoyalsMichelle Lawhorn, CNM requesting transfer of care to [facility] [place] related to BMI >49.   Patient states she cannot make regular appoints to [facility] and would like to remain with [facility] for pernatal care until [redacted] weeks gestation.  Patient would transfer at that time.    Roe CoombsDon is discussing with Marcelino DusterMichelle I left my name and contact information.

## 2016-09-13 NOTE — Telephone Encounter (Signed)
Victorino D[facility]Jennifer from [facility] called and stated that the patient is having transportation issues and will not be able to make it to her f[facility] appts at [facility]. She is wondering if we can continue care here in our office until 34 wks, then transfer her to [facility] in [place] at 34wks. Victorino DikeJennifer states you can call her if you have any questions. Her number is [phone]. Please advise. Thank you

## 2016-09-13 NOTE — Progress Notes (Signed)
[facility] Consultation   Chief Complaint: Medication exposure and obesity  HPI: Latasha Alexander is a 26 y.o. G1P0 at [redacted]w[redacted]d by 6 week ultrasound who presents in consultation from Dr Valentino Saxon for BMI=50 and medication exposure. Ms Salih has a history of major depressive disorder, OCD and anxiety. Early in her pregnancy she was advised to stop her medications (zoloft, abilify, and trazodone) and required hospitalization for suicidal ideation. She was re-started on zoloft and latuda. She reports that she has been advised that she needs to try to minimize her dose and wean the dose near the end of her pregnancy but she is concerned because she has had continued symptoms.   Past Medical History: Patient  has a past medical history of Anxiety; Depression; OCD (obsessive compulsive disorder); and Panic attacks.  Past Surgical History: She  has a past surgical history that includes No past surgeries and none.  Obstetric History:  OB History    Gravida Para Term Preterm AB Living   1         0   SAB TAB Ectopic Multiple Live Births                   Medications: As above and folate, PNV, zoloft, and tylenol prn  Allergies: Patient is allergic to cashew nut oil.  Social History: Patient  reports that she has never smoked. She has never used smokeless tobacco. She reports that she does not drink alcohol or use drugs.  Family History: family history includes Diabetes in her mother; Hypertension in her mother and sister; Stroke in her maternal grandfather.  Review of Systems A full 12 point review of systems was negative or as noted in the History of Present Illness.  Asessement: 1. Morbid obesity with BMI of 45.0-49.9, adult (HCC)   2. Anxiety and depression   3. Severe recurrent major depression without psychotic features (HCC)     Plan: 1. Anxiety, OCD, and major depressive disorder. We discussed the fact that all medications in pregnancy are associated with assessment  of risk versus benefit. In the case of someone with severe depression with suicidal ideation and requiring hospitalization, the benefit of medications clearly outweighs any potential risks/side effects to the fetus. The majority of antidepressant medications are considered generally safe in pregnancy but are associated with fetal side effects such as neonatal abstinence syndrome. First trimester exposure has variably been reported with congenital anomalies but none of the medications that Ms Byron is taking currently are associated with such risks. In some women who have been very stable on medications for many years, consideration can be given to decreasing or weaning off the medication near term to decrease the risk of NAS. For Ms Huebsch I have the following recommendations.  -I DO NOT recommend weaning or decreasing medication in Ms President given the severity of her illness.  -Any of the following medications may be used for the remainder of the pregnancy and the dose should be titrated up to control her psychiatric symptoms as needed: zoloft, latuda, abilify, trazodone. -We discussed the risk of increased psychiatric symptoms in the immediate postpartum period and the need for careful psychiatric follow-up within about 2 weeks of delivery as well as having phone numbers to call if needed before any appointment.  -Pediatrics should be alerted to the medication exposure at delivery. A slightly longer hospitalization for the infant for evaluation and treatment of any NAS may be warranted (dim lighting, skin to skin, avoidance of loud  noises).  2. Obesity, BMI=50 The patient reports that she was advised to transfer her [facility] to [facility] due to elevated BMI. We are happy to have her transfer her care to us. However, the patient doesn't have adequate transportation to visits in [place]. If she is able to continue her care locally, we would also be happy to assume her care started at [redacted] weeks gestation.  Total  time spent with the patient was 40 minutes with greater than 50% spent in counseling and coordination of care. We appreciate this interesting consult and will be happy to be involved in the ongoing care of Ms. Vandam in anyway her obstetricians desire.  Artemio AlyBrenna Hughes, MD Maternal-Fetal Medicine [facility]

## 2016-09-14 NOTE — Telephone Encounter (Signed)
This is fine.  We can let them know.

## 2016-09-17 NOTE — Telephone Encounter (Signed)
Jennifer notified at [facility] that yes, we could follow pt until 34wks.  I also tried to contact pt but her voice mail box has not been set up yet.

## 2016-09-20 NOTE — Telephone Encounter (Signed)
Left message for pt that Victorino DikeJennifer from [facility] called and that Dr. Valentino Saxonherry said it was okay to be seen here, and at 34wks [facility]. To contact office on Monday to schedule an appt

## 2016-09-23 ENCOUNTER — Other Ambulatory Visit: Payer: Self-pay | Admitting: Certified Nurse Midwife

## 2016-09-23 DIAGNOSIS — O219 Vomiting of pregnancy, unspecified: Secondary | ICD-10-CM

## 2016-09-24 MED ORDER — ONDANSETRON 4 MG PO TBDP: 4.0000 mg | ORAL_TABLET | Freq: Four times a day (QID) | ORAL | 0 refills | Status: DC | PRN

## 2016-09-25 ENCOUNTER — Encounter: Payer: Self-pay | Admitting: Certified Nurse Midwife

## 2016-09-25 ENCOUNTER — Ambulatory Visit: Payer: Self-pay | Admitting: Dietician

## 2016-10-04 ENCOUNTER — Encounter: Payer: Self-pay | Admitting: Dietician

## 2016-10-08 NOTE — Telephone Encounter (Signed)
Left message to contact office to set up a ROB appt.  Will send my chart message also.

## 2016-10-09 NOTE — Telephone Encounter (Signed)
Pt calls office and states she is going to continue to go to [facility] for her care.

## 2016-10-27 ENCOUNTER — Encounter: Payer: Self-pay | Admitting: Obstetrics and Gynecology

## 2017-04-11 ENCOUNTER — Encounter (HOSPITAL_COMMUNITY): Payer: Self-pay

## 2017-07-04 ENCOUNTER — Emergency Department
Admission: EM | Admit: 2017-07-04 | Discharge: 2017-07-04 | Disposition: A | Discharge status: Home / Self Care | Payer: BLUE CROSS/BLUE SHIELD | Attending: Emergency Medicine | Admitting: Emergency Medicine

## 2017-07-04 ENCOUNTER — Other Ambulatory Visit: Payer: Self-pay

## 2017-07-04 DIAGNOSIS — Z79899 Other long term (current) drug therapy: Secondary | ICD-10-CM | POA: Insufficient documentation

## 2017-07-04 DIAGNOSIS — J039 Acute tonsillitis, unspecified: Secondary | ICD-10-CM | POA: Diagnosis not present

## 2017-07-04 DIAGNOSIS — J029 Acute pharyngitis, unspecified: Secondary | ICD-10-CM | POA: Diagnosis present

## 2017-07-04 MED ORDER — MAGIC MOUTHWASH W/LIDOCAINE: 5.0000 mL | Freq: Four times a day (QID) | ORAL | 0 refills | Status: DC

## 2017-07-04 MED ORDER — AMOXICILLIN 875 MG PO TABS: 875.0000 mg | ORAL_TABLET | Freq: Two times a day (BID) | ORAL | 0 refills | Status: DC

## 2017-07-04 MED ORDER — METHYLPREDNISOLONE 4 MG PO TBPK: ORAL_TABLET | ORAL | 0 refills | Status: DC

## 2017-07-04 NOTE — ED Provider Notes (Signed)
[facility] Emergency Department Provider Note   ____________________________________________   First MD Initiated Contact with Patient 07/04/17 1109     (approximate)  I have reviewed the triage vital signs and the nursing notes.   HISTORY  Chief Complaint Sore Throat    HPI Latasha Alexander is a 27 y.o. female patient presents with sore throat for 2 days.  Patient states she was seen yesterday PCP and was not tested for strep.  Patient awakens with morning of more edema and pain.. Patient states able to tolerate food and fluid but with difficulty.  Patient denies URI signs or symptoms.  No palliative measure for complaint.  Patient rates the pain as 8/10.  Past Medical History:  Diagnosis Date  . Anxiety   . Depression   . OCD (obsessive compulsive disorder)   . Panic attacks     Patient Active Problem List   Diagnosis Date Noted  . OCD (obsessive compulsive disorder) 07/17/2016  . Severe recurrent major depression without psychotic features (HCC) 07/16/2016  . Pregnant 07/16/2016  . Anxiety and depression 07/08/2016  . Nausea and vomiting during pregnancy 07/08/2016  . Urinary symptom or sign 07/08/2016  . Supervision of high risk pregnancy in first trimester 07/08/2016  . Morbid obesity with BMI of 45.0-49.9, adult (HCC) 06/06/2016    Past Surgical History:  Procedure Laterality Date  . NO PAST SURGERIES    . none      Prior to Admission medications   Medication Sig Start Date End Date Taking? Authorizing Provider  amoxicillin (AMOXIL) 875 MG tablet Take 1 tablet (875 mg total) by mouth 2 (two) times daily. 07/04/17   Joni Reining, PA-C  fluticasone (FLONASE) 50 MCG/ACT nasal spray Place 2 sprays into both nostrils daily. 08/09/16   Domenick Gong, MD  folic acid (FOLVITE) 1 MG tablet Take 2 tablets (2 mg total) by mouth daily. 07/20/16   Pucilowska, Jolanta B, MD  LATUDA 40 MG TABS tablet TAKE 1 TABLET BY MOUTH EVERY DAY WITH  SUPPER 08/20/16   Hildred Laser, MD  magic mouthwash w/lidocaine SOLN Take 5 mLs by mouth 4 (four) times daily. Swish and swallow 07/04/17   Joni Reining, PA-C  Melatonin 5 MG TABS Take 1 tablet (5 mg total) by mouth at bedtime. Patient not taking: Reported on 09/13/2016 07/19/16   Shari Prows, MD  methylPREDNISolone (MEDROL DOSEPAK) 4 MG TBPK tablet Take Tapered dose as directed 07/04/17   Joni Reining, PA-C  ondansetron (ZOFRAN ODT) 4 MG disintegrating tablet Take 1 tablet (4 mg total) by mouth every 6 (six) hours as needed for nausea. 09/24/16   Lawhorn, Vanessa [place], CNM  Prenatal Vit-Fe Fumarate-FA (MULTIVITAMIN-PRENATAL) 27-0.8 MG TABS tablet Take 1 tablet by mouth daily at 12 noon.    [provider]  promethazine (PHENERGAN) 12.5 MG tablet Take 1 tablet (12.5 mg total) by mouth every 6 (six) hours as needed for nausea or vomiting. 07/05/16   Hildred Laser, MD  sertraline (ZOLOFT) 25 MG tablet Take 5 tablets (125 mg total) by mouth daily. 07/20/16   Pucilowska, Ellin Goodie, MD    Allergies Cashew nut oil  Family History  Problem Relation Age of Onset  . Diabetes Mother   . Hypertension Mother   . Hypertension Sister   . Stroke Maternal Grandfather     Social History Social History   Tobacco Use  . Smoking status: Never Smoker  . Smokeless tobacco: Never Used  Substance Use Topics  .  Alcohol use: No    Alcohol/week: 0.0 oz  . Drug use: No    Review of Systems Constitutional: No fever/chills Eyes: No visual changes. ENT: No sore throat. Cardiovascular: Denies chest pain. Respiratory: Denies shortness of breath. Gastrointestinal: No abdominal pain.  No nausea, no vomiting.  No diarrhea.  No constipation. Genitourinary: Negative for dysuria. Musculoskeletal: Negative for back pain. Skin: Negative for rash. Neurological: Negative for headaches, focal weakness or numbness. Psychiatric:, Depression, OCD, and panic attacks. Allergic/Immunilogical: Cashew  nuts ____________________________________________   PHYSICAL EXAM:  VITAL SIGNS: ED Triage Vitals  Enc Vitals Group     BP 07/04/17 1052 (!) 150/77     Pulse Rate 07/04/17 1052 85     Resp 07/04/17 1052 18     Temp 07/04/17 1052 97.8 F (36.6 C)     Temp Source 07/04/17 1052 Oral     SpO2 07/04/17 1052 98 %     Weight 07/04/17 1053 (!) 330 lb (149.7 kg)     Height 07/04/17 1053 5\' 5"  (1.651 m)     Head Circumference --      Peak Flow --      Pain Score 07/04/17 1038 8     Pain Loc --      Pain Edu? --      Excl. in GC? --     Constitutional: Alert and oriented. Well appearing and in no acute distress.  Morbid obesity Mouth/Throat: Mucous membranes are moist.  Oropharynx  erythematous.  Exudative tonsils Neck: No stridor. Hematological/Lymphatic/Immunilogical: Bilateral l cervical lymphadenopathy. Cardiovascular: Normal rate, regular rhythm. Grossly normal heart sounds.  Good peripheral circulation. Respiratory: Normal respiratory effort.  No retractions. Lungs CTAB. Gastrointestinal: Soft and nontender. No  Neurologic:  Normal speech and language. No gross focal neurologic deficits are appreciated. No gait instability. Skin:  Skin is warm, dry and intact. No rash noted. Psychiatric: Mood and affect are normal. Speech and behavior are normal.  ____________________________________________   LABS (all labs ordered are listed, but only abnormal results are displayed)  Labs Reviewed - No data to display ____________________________________________  EKG   ____________________________________________  RADIOLOGY  No results found.  ____________________________________________   PROCEDURES  Procedure(s) performed: None  Procedures  Critical Care performed: No  ____________________________________________   INITIAL IMPRESSION / ASSESSMENT AND PLAN / ED COURSE  As part of my medical decision making, I reviewed the following data within the electronic medical  record:     Sore throat secondary to exudative tonsils.  Patient given discharge care instruction.  Patient advised take medication as directed.  Advised to follow-up PCP if condition persists.      ____________________________________________   FINAL CLINICAL IMPRESSION(S) / ED DIAGNOSES  Final diagnoses:  Tonsillitis     ED Discharge Orders        Ordered    amoxicillin (AMOXIL) 875 MG tablet  2 times daily     07/04/17 1115    magic mouthwash w/lidocaine SOLN  4 times daily    Comments:  Mix 30 mL of Benadryl, 30 mL of nystatin, 30 mL of viscous lidocaine, and 30 mL ofAlum&Mag Hydroxide-Simeth   07/04/17 1115    methylPREDNISolone (MEDROL DOSEPAK) 4 MG TBPK tablet     07/04/17 1115       Note:  This document was prepared using Dragon voice recognition software and may include unintentional dictation errors.    Joni ReiningSmith, Betheny Suchecki K, PA-C 07/04/17 1122    Governor RooksLord, Rebecca, MD 07/06/17 [phone]

## 2017-07-04 NOTE — ED Triage Notes (Signed)
Pt c/o sore throat for the past 2 days, states she was seen at PCP but they did not test her for strep

## 2017-07-04 NOTE — ED Notes (Signed)
Pt seen at pcp - was to follow up with ent but they did not call her back so she came to ed. White pustules noted on tonsils.

## 2017-10-29 ENCOUNTER — Ambulatory Visit: Payer: Self-pay | Admitting: *Deleted

## 2017-11-20 ENCOUNTER — Other Ambulatory Visit: Payer: Self-pay | Admitting: Medical Oncology

## 2017-11-20 DIAGNOSIS — R1011 Right upper quadrant pain: Secondary | ICD-10-CM

## 2017-11-20 DIAGNOSIS — R0789 Other chest pain: Secondary | ICD-10-CM

## 2017-11-21 ENCOUNTER — Ambulatory Visit
Admission: RE | Admit: 2017-11-21 | Discharge: 2017-11-21 | Disposition: A | Discharge status: Home / Self Care | Payer: BLUE CROSS/BLUE SHIELD | Source: Ambulatory Visit | Attending: Medical Oncology | Admitting: Medical Oncology

## 2017-11-21 ENCOUNTER — Encounter: Payer: Self-pay | Admitting: Radiology

## 2017-11-21 DIAGNOSIS — R1011 Right upper quadrant pain: Secondary | ICD-10-CM

## 2017-11-21 DIAGNOSIS — R0789 Other chest pain: Secondary | ICD-10-CM | POA: Diagnosis present

## 2017-11-28 ENCOUNTER — Other Ambulatory Visit: Payer: Self-pay | Admitting: Medical Oncology

## 2017-11-28 DIAGNOSIS — R10811 Right upper quadrant abdominal tenderness: Secondary | ICD-10-CM

## 2017-11-28 DIAGNOSIS — R198 Other specified symptoms and signs involving the digestive system and abdomen: Secondary | ICD-10-CM

## 2017-12-06 ENCOUNTER — Ambulatory Visit
Admission: RE | Admit: 2017-12-06 | Discharge: 2017-12-06 | Disposition: A | Discharge status: Home / Self Care | Payer: BLUE CROSS/BLUE SHIELD | Source: Ambulatory Visit | Attending: Medical Oncology | Admitting: Medical Oncology

## 2017-12-06 DIAGNOSIS — R198 Other specified symptoms and signs involving the digestive system and abdomen: Secondary | ICD-10-CM | POA: Diagnosis present

## 2017-12-06 DIAGNOSIS — R10811 Right upper quadrant abdominal tenderness: Secondary | ICD-10-CM

## 2017-12-06 DIAGNOSIS — K8020 Calculus of gallbladder without cholecystitis without obstruction: Secondary | ICD-10-CM | POA: Diagnosis not present

## 2019-03-13 ENCOUNTER — Telehealth: Payer: Self-pay

## 2019-03-13 NOTE — Telephone Encounter (Signed)
Pre-visit screening call was attempted prior to [facility] clinic appointment on 03/17/2019. Call was disconnected after 5 rings. Not able to leave a voicemail. Will try again later this afternoon.

## 2019-03-17 ENCOUNTER — Ambulatory Visit: Payer: Self-pay | Attending: Oncology

## 2019-03-17 ENCOUNTER — Other Ambulatory Visit: Payer: Self-pay

## 2019-03-17 VITALS — BP 127/75 | HR 87 | Temp 98.5°F | Ht 65.5 in | Wt 356.0 lb

## 2019-03-17 DIAGNOSIS — N63 Unspecified lump in unspecified breast: Secondary | ICD-10-CM

## 2019-03-17 NOTE — Progress Notes (Signed)
  Subjective:     Patient ID: Latasha Alexander, female   DOB: 23-Nov-1990, 28 y.o.   MRN: [id number]  HPI   Review of Systems     Objective:   Physical Exam Chest:     Breasts:        Right: No swelling, bleeding, inverted nipple, mass, nipple discharge, skin change or tenderness.        Left: No swelling, bleeding, inverted nipple, mass, nipple discharge, skin change or tenderness.     Comments: Large pendulous breasts       Assessment:     28 year old patient presents for [facility] clinic visit.  Patient referred for tender left breast mass palpated by primary provider two  Weeks ago.  Patient screened, and meets BCCCP eligibility.  Patient does not have insurance, Medicare or Medicaid. Instructed patient on breast self awareness using teach back method.  Patient states she no longer has tenderness, and cannot feel lump.  Clinical breast exam unremarkable.  No mass palpated.  Patient states she took birth control once in April when she was sexually active.  States her periods have been irregular, and lasting only 3 days.  Previously they were 5 days. States overt the counter pregnancy tests negative. She is seeing her primary provider today.  Instructed to discuss at that visit    Plan:     Sent for left breast ultrasound.

## 2019-04-02 ENCOUNTER — Inpatient Hospital Stay: Payer: Self-pay | Attending: Oncology | Admitting: Oncology

## 2019-04-07 ENCOUNTER — Other Ambulatory Visit: Payer: Self-pay | Admitting: Family Medicine

## 2019-04-07 ENCOUNTER — Ambulatory Visit
Admission: RE | Admit: 2019-04-07 | Discharge: 2019-04-07 | Disposition: A | Discharge status: Home / Self Care | Payer: Medicaid Other | Source: Ambulatory Visit | Attending: Oncology | Admitting: Oncology

## 2019-04-07 DIAGNOSIS — K8020 Calculus of gallbladder without cholecystitis without obstruction: Secondary | ICD-10-CM

## 2019-04-07 DIAGNOSIS — N63 Unspecified lump in unspecified breast: Secondary | ICD-10-CM | POA: Insufficient documentation

## 2019-04-07 NOTE — Progress Notes (Signed)
Letter mailed from [facility] to notify of normal ultrasound results.  Patient to return in one year for annual screening.  Copy to [facility].

## 2019-04-08 ENCOUNTER — Other Ambulatory Visit: Payer: Self-pay | Admitting: Family Medicine

## 2019-04-08 DIAGNOSIS — K8020 Calculus of gallbladder without cholecystitis without obstruction: Secondary | ICD-10-CM

## 2019-04-13 ENCOUNTER — Ambulatory Visit
Admission: RE | Admit: 2019-04-13 | Discharge: 2019-04-13 | Disposition: A | Discharge status: Home / Self Care | Payer: Self-pay | Source: Ambulatory Visit | Attending: Family Medicine | Admitting: Family Medicine

## 2019-04-13 ENCOUNTER — Other Ambulatory Visit: Payer: Self-pay

## 2019-04-13 ENCOUNTER — Ambulatory Visit (HOSPITAL_COMMUNITY): Payer: Medicaid Other

## 2019-04-13 ENCOUNTER — Encounter (HOSPITAL_COMMUNITY): Payer: Self-pay

## 2019-04-13 DIAGNOSIS — K8020 Calculus of gallbladder without cholecystitis without obstruction: Secondary | ICD-10-CM | POA: Insufficient documentation

## 2019-04-14 ENCOUNTER — Ambulatory Visit: Payer: Self-pay | Admitting: General Surgery

## 2019-04-14 NOTE — H&P (Signed)
PATIENT PROFILE: Latasha Alexander is a 28 y.o. female who presents to the Clinic for consultation at the request of Dr. Pasty Arch for evaluation of cholelithiasis.  PCP:  Nelwyn Salisbury, PA  HISTORY OF PRESENT ILLNESS: [name] reports having frequent pain of the upper abdomen.  To report that the pain started on the right upper quadrant.  Pain radiates to her chest and to her right shoulder.  First attack was 2 years ago when she was pregnant.  After pregnancy she had a multiple episodes.  In the last 2 to 3-monthshe has been having daily, "gallbladder attacks".  She described as the right upper current pain that radiates to her shoulder.  Pain are associated with nausea.  Aggravating factor is eating fried food.  There is no alleviating factor.  The pain goes by itself.  She does not take any pain medications.  She denies fever or chills.  Patient had multiple ultrasound ordered by her primary care physician.  Ultrasound shows multiple gallbladder stones without gallbladder wall thickening or pericholecystic fluid.  I personally evaluated the images of the ultrasounds.   PROBLEM LIST:         Problem List  Date Reviewed: 04/14/2019         Noted   Type 2 diabetes mellitus without complication, without long-term current use of insulin (CMS-HCC) 10/17/2017   Overview    Metformin started 3/19      Cesarean delivery delivered 03/05/2017   LUQ fullness 10/26/2016   Overview    Abdominal ultrasound ordered. Difficult exam, pt reports "lump" not painful. R/O splenomegaly ordered 10/26/16      BMI 50.0-59.9, adult (CMS-HCC) 09/28/2016   Gestational diabetes mellitus (GDM) controlled on oral hypoglycemic drug 09/28/2016   Overview    Elevated early 1 hour with normal 3 hour Will need repeat 3 hr at 28 weeks      Kidney abnormality of fetus on prenatal ultrasound - RESOLVED 09/28/2016   Overview    ADDENDUM: Kidneys appear normal at 29 week growth ultrasound  (11/12/16)  Mild pelviectasis on anatomy scan in [place](3.5-4.5) [place] up at [place]     Anxiety and depression 03/16/2016   Overview    Hospitalized with SI in early pregnancy when medications had been stopped, subsequently she was restarted on Latuda, zoloft and abilify. Recommend continuing through pregnancy.  [facility]        GENERAL REVIEW OF SYSTEMS:   General ROS: negative for - chills, fatigue, fever, positive for weight gain Allergy and Immunology ROS: negative for - hives  Hematological and Lymphatic ROS: negative for - bleeding problems or bruising, negative for palpable nodes Endocrine ROS: negative for - heat or cold intolerance, hair changes Respiratory ROS: negative for - cough, shortness of breath or wheezing Cardiovascular ROS: no chest pain or palpitations GI ROS: negative for diarrhea, constipation.  Positive for abdominal pain, nausea and vomiting and heartburn Musculoskeletal ROS: negative for - joint swelling or muscle pain Neurological ROS: negative for - confusion, syncope Dermatological ROS: negative for pruritus and rash Psychiatric: negative for anxiety, depression, difficulty sleeping and memory loss  MEDICATIONS: Current Medications        Current Outpatient Medications  Medication Sig Dispense Refill  . ABILIFY 10 mg tablet Patient taking 546mQD with plan with psychiatrist to increase to 1028mD 30 tablet 0  . ACCU-CHEK FASTCLIX LANCET DRUM     . blood glucose diagnostic test strip Use to check fasting blood  sugars once daily or if you feel that your sugar is too high/low. E11.9 100 each 12  . etonogestrel (NEXPLANON) 68 mg implant Inject 1 each into the skin once    . lancing device with lancets kit Use to check fasting blood sugars once daily or if you feel that your sugar is too high/low. E11.9 50 each 12  . liraglutide (SAXENDA) 3 mg/0.5 mL (18 mg/3 mL) PnIj pen injector Inject 3 mg subcutaneously once daily 5  Syringe 5  . metFORMIN (GLUCOPHAGE) 500 MG tablet Take 1 tablet (500 mg total) by mouth 2 (two) times daily with meals 60 tablet 1  . norethindrone (MICRONOR) 0.35 mg tablet Take 1 tablet by mouth once daily    . ofloxacin (OCUFLOX) 0.3 % ophthalmic solution 10 drops into ear once daily for 7 days 10 mL 0  . pen needle, diabetic 32 gauge x 5/32" Ndle Use 1 each once daily 90 each 2  . sertraline (ZOLOFT) 100 MG tablet TK 2 TS PO D  2   No current facility-administered medications for this visit.       ALLERGIES: Cashew nut  PAST MEDICAL HISTORY:     Past Medical History:  Diagnosis Date  . Abdominal pain of unknown etiology, unspecified   . Acid reflux   . Anxiety   . Cholecystitis   . Depression   . History of OCD (obsessive compulsive disorder)   . History of panic attacks   . Type 2 diabetes mellitus (CMS-HCC)     PAST SURGICAL HISTORY:      Past Surgical History:  Procedure Laterality Date  . CESAREAN DELIVERY N/A 01/19/2017   Procedure: CESAREAN DELIVERY ONLY;  Surgeon: Karie Georges, MD;  Location: [place];  Service: Obstetrics;  Laterality: N/A;  . PR CESAREAN DELIVERY ONLY       FAMILY HISTORY:      Family History  Problem Relation Age of Onset  . High blood pressure (Hypertension) Mother   . Diabetes Mother   . Anxiety Mother   . High blood pressure (Hypertension) Sister   . Diabetes type II Maternal Grandmother   . Stroke Maternal Grandfather   . High blood pressure (Hypertension) Sister      SOCIAL HISTORY: Social History          Socioeconomic History  . Marital status: Single    Spouse name: Not on file  . Number of children: Not on file  . Years of education: Not on file  . Highest education level: Not on file  Occupational History  . Not on file  Social Needs  . Financial resource strain: Not on file  . Food insecurity    Worry: Not on file    Inability: Not on file  .  Transportation needs    Medical: Not on file    Non-medical: Not on file  Tobacco Use  . Smoking status: Light Tobacco Smoker    Packs/day: 0.00    Years: 0.00    Pack years: 0.00  . Smokeless tobacco: Never Used  . Tobacco comment: vapes sometimes  Substance and Sexual Activity  . Alcohol use: Not Currently    Comment: occ  . Drug use: No  . Sexual activity: Yes    Partners: Male  Other Topics Concern  . Not on file  Social History Narrative   Single, G0   Currently unemployed. Lives with parents and siblings   Denies tobacco/ETOH/drug use    Caffeine: none  Exercise: walking daily      PHYSICAL EXAM:    Vitals:   04/14/19 0943  BP: (!) 153/95  Pulse: 95   Body mass index is 59.65 kg/m. Weight: (!) 162.6 kg (358 lb 7.5 oz)   GENERAL: Alert, active, oriented x3  HEENT: Pupils equal reactive to light. Extraocular movements are intact. Sclera clear. Palpebral conjunctiva normal red color.Pharynx clear.  NECK: Supple with no palpable mass and no adenopathy.  LUNGS: Sound clear with no rales rhonchi or wheezes.  HEART: Regular rhythm S1 and S2 without murmur.  ABDOMEN: Soft and depressible, nontender with no palpable mass, no hepatomegaly.   EXTREMITIES: Well-developed well-nourished symmetrical with no dependent edema.  NEUROLOGICAL: Awake alert oriented, facial expression symmetrical, moving all extremities.  REVIEW OF DATA: I have reviewed the following data today:      Office Visit on 03/25/2019  Component Date Value  . Thyroid Stimulating Horm* 03/25/2019 1.38   . Thyroxine, Free (FT4) 03/25/2019 0.63   . WBC (White Blood Cell Co* 03/25/2019 9.7   . Hemoglobin 03/25/2019 13.0   . Hematocrit 03/25/2019 41.9   . Platelets 03/25/2019 360   . MCV (Mean Corpuscular Vo* 03/25/2019 90   . MCH (Mean Corpuscular He* 03/25/2019 27.9   . MCHC (Mean Corpuscular H* 03/25/2019 31.0*  . RBC (Red Blood Cell Coun* 03/25/2019 4.66    . RDW-CV (Red Cell Distrib* 03/25/2019 14.5   . MPV (Mean Platelet Volum* 03/25/2019 10.0   . Sodium 03/25/2019 139   . Potassium 03/25/2019 4.7   . Chloride 03/25/2019 104   . Carbon Dioxide (CO2) 03/25/2019 24   . Urea Nitrogen (BUN) 03/25/2019 8   . Creatinine 03/25/2019 0.7   . Glucose 03/25/2019 107   . Calcium 03/25/2019 9.3   . AST (Aspartate Aminotran* 03/25/2019 23   . ALT (Alanine Aminotransf* 03/25/2019 30   . Bilirubin, Total 03/25/2019 0.3*  . Alk Phos (Alkaline Phosp* 03/25/2019 88   . Albumin 03/25/2019 3.8   . Protein, Total 03/25/2019 8.0   . Anion Gap 03/25/2019 11   . BUN/CREA Ratio 03/25/2019 11   . Glomerular Filtration Ra* 03/25/2019 120   . GGT (Gamma Jerrilyn Cairo* 03/25/2019 22      ASSESSMENT: Ms. Lynn is a 28 y.o. female presenting for consultation for cholelithiasis.    Patient was oriented about the diagnosis of cholelithiasis. Also oriented about what is the gallbladder, its anatomy and function and the implications of having stones / gallbladder low ejection fraction. The patient was oriented about the treatment alternatives (observation vs cholecystectomy). Patient was oriented that a low percentage of patient will continue to have similar pain symptoms even after the gallbladder is removed. Surgical technique (open vs laparoscopic) was discussed. It was also discussed the goals of the surgery (decrease the pain episodes and avoid the risk of cholecystitis) and the risk of surgery including: bleeding, infection, common bile duct injury, stone retention, injury to other organs such as bowel, liver, stomach, other complications such as hernia, bowel obstruction among others. Also discussed with patient about anesthesia and its complications such as: reaction to medications, pneumonia, heart complications, death, among others.  We also discussed about her morbid obesity.  Patient with 60 of BMI.  She has been previously evaluating the weight loss  clinic but due to insurance she has not been able to have adequate follow-up.  She has tried weight loss medications.  She has also tried to improve her diet.  She is very willing to  lose weight but he has been very difficult due to her social situation.  Patient will like to proceed with bariatric surgery evaluation and eventually have bariatric surgery.  We discussed about the increased risk of surgery due to her weight.  These include increased rate of DVT, Intra-Op complications, wound complications, pulmonary complications, cardiac complication, among others.  We also discussed about vaping.  I discussed with her the risks of vaping that include delayed wound healing, wound complications, high risk of pneumonia and other lung complications.  Patient reported that she will stop vaping today and she will also watch what she is eating to try to lose weight.  She does want to proceed with surgery due to her attacks are pain more frequently, even daily.  I gave her the option of doing in the next 1 to 2 weeks but she wants to wait 4 weeks to try to stop vaping and continue losing weight.  Calculus of gallbladder without cholecystitis without obstruction [K80.20]  PLAN: 1.  Robotic assisted laparoscopic cholecystectomy (96283) 2.  CBC and CMP done (03/25/2019) 3.  Avoid taking aspirin 5 days before surgery 4.  Needs to stop vaping today 5.  Continue with the goal of losing weight with diet and exercise.  Recommend to continue follow-up with weight loss clinic 6.  Contact us you have any question or concern.  Patient verbalized understanding, all questions were answered, and were agreeable with the plan outlined above.     Herbert Pun, MD  Electronically signed by Herbert Pun, MD

## 2019-05-05 ENCOUNTER — Other Ambulatory Visit: Admission: RE | Admit: 2019-05-05 | Payer: Medicaid Other | Source: Ambulatory Visit

## 2019-05-07 ENCOUNTER — Other Ambulatory Visit: Payer: Medicaid Other

## 2019-05-11 ENCOUNTER — Ambulatory Visit: Admit: 2019-05-11 | Payer: Medicaid Other | Admitting: General Surgery

## 2019-05-11 SURGERY — CHOLECYSTECTOMY, ROBOT-ASSISTED, LAPAROSCOPIC
Anesthesia: General

## 2019-06-26 DIAGNOSIS — U071 COVID-19: Secondary | ICD-10-CM

## 2019-06-26 HISTORY — DX: COVID-19: U07.1

## 2019-08-16 ENCOUNTER — Other Ambulatory Visit: Payer: Self-pay

## 2019-08-16 DIAGNOSIS — O219 Vomiting of pregnancy, unspecified: Secondary | ICD-10-CM

## 2019-08-21 ENCOUNTER — Ambulatory Visit
Admission: EM | Admit: 2019-08-21 | Discharge: 2019-08-21 | Disposition: A | Discharge status: Home / Self Care | Payer: Medicaid Other | Attending: Family Medicine | Admitting: Family Medicine

## 2019-08-21 ENCOUNTER — Encounter: Payer: Self-pay | Admitting: Emergency Medicine

## 2019-08-21 ENCOUNTER — Other Ambulatory Visit: Payer: Self-pay

## 2019-08-21 DIAGNOSIS — M545 Low back pain, unspecified: Secondary | ICD-10-CM

## 2019-08-21 DIAGNOSIS — E119 Type 2 diabetes mellitus without complications: Secondary | ICD-10-CM

## 2019-08-21 DIAGNOSIS — Z0189 Encounter for other specified special examinations: Secondary | ICD-10-CM | POA: Diagnosis present

## 2019-08-21 HISTORY — DX: Type 2 diabetes mellitus without complications: E11.9

## 2019-08-21 HISTORY — DX: Pure hypercholesterolemia, unspecified: E78.00

## 2019-08-21 LAB — BASIC METABOLIC PANEL
Anion gap: 13 (ref 5–15)
BUN: 13 mg/dL (ref 6–20)
CO2: 24 mmol/L (ref 22–32)
Calcium: 9.2 mg/dL (ref 8.9–10.3)
Chloride: 99 mmol/L (ref 98–111)
Creatinine, Ser: 0.72 mg/dL (ref 0.44–1.00)
GFR calc Af Amer: >60 mL/min (ref >60)
GFR calc non Af Amer: >60 mL/min (ref >60)
Glucose, Bld: 135 mg/dL — ABNORMAL HIGH (ref 70–99)
Potassium: 4.2 mmol/L (ref 3.5–5.1)
Sodium: 136 mmol/L (ref 135–145)

## 2019-08-21 LAB — URINALYSIS, COMPLETE (UACMP) WITH MICROSCOPIC
Bacteria, UA: NONE SEEN
Bilirubin Urine: NEGATIVE
Glucose, UA: NEGATIVE mg/dL
Hgb urine dipstick: NEGATIVE
Ketones, ur: NEGATIVE mg/dL
Leukocytes,Ua: NEGATIVE
Nitrite: NEGATIVE
Protein, ur: NEGATIVE mg/dL
RBC / HPF: NONE SEEN RBC/hpf (ref 0–5)
Specific Gravity, Urine: <1.005 — ABNORMAL LOW (ref 1.005–1.030)
WBC, UA: NONE SEEN WBC/hpf (ref 0–5)
pH: 6.5 (ref 5.0–8.0)

## 2019-08-21 MED ORDER — ACETAMINOPHEN 500 MG PO TABS: 1000.0000 mg | ORAL_TABLET | Freq: Three times a day (TID) | ORAL | 0 refills | Status: DC | PRN

## 2019-08-21 MED ORDER — CYCLOBENZAPRINE HCL 5 MG PO TABS: 5.0000 mg | ORAL_TABLET | Freq: Every day | ORAL | 0 refills | Status: DC

## 2019-08-21 NOTE — Discharge Instructions (Signed)
Your urine looks good here today without any indication of kidney infection and no large protein visualized.  I do not feel that your back pain is related to your lab value collected by your doctor.  The value your doctor collected is monitoring your diabetes control, so continue to monitor your diet and exercise to keep good control of this.  This value will likely be monitored and trended over time.  Tylenol as needed for back pain, muscle relaxer at night as needed to help with back pain.  See exercises provided for your back pain.  Continue to follow up with your primary care provider for further evaluation and management as needed.

## 2019-08-21 NOTE — ED Provider Notes (Signed)
[facility]    CSN: [id number] Arrival date & time: 08/21/19  1503      History   Chief Complaint Chief Complaint  Patient presents with  . Back Pain    HPI Latasha Alexander is a 29 y.o. female.   Latasha Alexander presents with complaints of low back pain. This is not necessarily new for her, but she was told she had protein in her urine yesterday at her PCP visit, and had kidney damage, therefore she should have her back pain evaluated. On chart review she had an abnormal Microalbumin/Creatinine Ratio 30-300. She is diabetic, her a1c 03/2019 was 6.9. no history of hypertension however, and bp was normal yesterday at 120/81. States her back pain is worse with certain movements. No specific known trigger. It is to bilateral low back. She does not regularly do any frequent heavy lifting or bending, although does lift her son up regularly. Hasn't taken any medications for back pain. No pain to buttocks or down legs. No numbness tingling or weakness. States sometimes she has urinary incontinence, this has been ongoing for the past year. No other new urinary symptoms. Has a follow up appointment with her PCP on Monday 3/1  ROS per HPI, negative if not otherwise mentioned.      Past Medical History:  Diagnosis Date  . Anxiety   . Depression   . Diabetes mellitus without complication (HCC)   . Hypercholesterolemia   . OCD (obsessive compulsive disorder)   . Panic attacks     Patient Active Problem List   Diagnosis Date Noted  . OCD (obsessive compulsive disorder) 07/17/2016  . Severe recurrent major depression without psychotic features (HCC) 07/16/2016  . Pregnant 07/16/2016  . Anxiety and depression 07/08/2016  . Nausea and vomiting during pregnancy 07/08/2016  . Urinary symptom or sign 07/08/2016  . Supervision of high risk pregnancy in first trimester 07/08/2016  . Morbid obesity with BMI of 45.0-49.9, adult (HCC) 06/06/2016    Past Surgical History:    Procedure Laterality Date  . CESAREAN SECTION    . NO PAST SURGERIES    . none      OB History    Gravida  1   Para      Term      Preterm      AB      Living  0     SAB      TAB      Ectopic      Multiple      Live Births               Home Medications    Prior to Admission medications   Medication Sig Start Date End Date Taking? Authorizing Provider  ARIPiprazole (ABILIFY) 10 MG tablet Take 10 mg by mouth daily.   Yes [provider]  atorvastatin (LIPITOR) 10 MG tablet Take 10 mg by mouth daily.   Yes [provider]  metFORMIN (GLUCOPHAGE) 500 MG tablet Take 500 mg by mouth 2 (two) times daily. 03/13/19  Yes [provider]  norethindrone-ethinyl estradiol (LOESTRIN) 1-20 MG-MCG tablet Take by mouth. 08/20/19 08/19/20 Yes [provider]  sertraline (ZOLOFT) 100 MG tablet Take 100 mg by mouth 2 (two) times daily.   Yes [provider]  acetaminophen (TYLENOL) 500 MG tablet Take 2 tablets (1,000 mg total) by mouth every 8 (eight) hours as needed. 08/21/19   Georgetta Haber, NP  cyclobenzaprine (FLEXERIL) 5 MG tablet Take  1 tablet (5 mg total) by mouth at bedtime. 08/21/19   Georgetta Haber, NP  fluticasone (FLONASE) 50 MCG/ACT nasal spray Place 2 sprays into both nostrils daily. Patient not taking: Reported on 04/29/2019 08/09/16   Domenick Gong, MD  folic acid (FOLVITE) 1 MG tablet Take 2 tablets (2 mg total) by mouth daily. Patient not taking: Reported on 04/29/2019 07/20/16   Pucilowska, Braulio Conte B, MD  glipiZIDE (GLUCOTROL XL) 5 MG 24 hr tablet Take 5 mg by mouth daily with breakfast.    [provider]  LATUDA 40 MG TABS tablet TAKE 1 TABLET BY MOUTH EVERY DAY WITH SUPPER Patient not taking: No sig reported 08/20/16   Hildred Laser, MD  magic mouthwash w/lidocaine SOLN Take 5 mLs by mouth 4 (four) times daily. Swish and swallow Patient not taking: Reported on 04/29/2019 07/04/17   Joni Reining, PA-C   Melatonin 5 MG TABS Take 1 tablet (5 mg total) by mouth at bedtime. Patient not taking: Reported on 09/13/2016 07/19/16   Shari Prows, MD  methylPREDNISolone (MEDROL DOSEPAK) 4 MG TBPK tablet Take Tapered dose as directed Patient not taking: Reported on 04/29/2019 07/04/17   Joni Reining, PA-C  ondansetron (ZOFRAN ODT) 4 MG disintegrating tablet Take 1 tablet (4 mg total) by mouth every 6 (six) hours as needed for nausea. Patient not taking: Reported on 04/29/2019 09/24/16   Gunnar Bulla, CNM  promethazine (PHENERGAN) 12.5 MG tablet Take 1 tablet (12.5 mg total) by mouth every 6 (six) hours as needed for nausea or vomiting. Patient not taking: Reported on 04/29/2019 07/05/16   Hildred Laser, MD    Family History Family History  Problem Relation Age of Onset  . Diabetes Mother   . Hypertension Mother   . Hypertension Sister   . Stroke Maternal Grandfather     Social History Social History   Tobacco Use  . Smoking status: Current Some Day Smoker    Types: Cigarettes  . Smokeless tobacco: Never Used  Substance Use Topics  . Alcohol use: No    Alcohol/week: 0.0 standard drinks  . Drug use: No     Allergies   Cashew nut oil   Review of Systems Review of Systems   Physical Exam Triage Vital Signs ED Triage Vitals  Enc Vitals Group     BP 08/21/19 1533 (!) 144/90     Pulse Rate 08/21/19 1533 85     Resp 08/21/19 1533 14     Temp 08/21/19 1533 98.2 F (36.8 C)     Temp Source 08/21/19 1533 Oral     SpO2 08/21/19 1533 100 %     Weight 08/21/19 1527 (!) 354 lb (160.6 kg)     Height 08/21/19 1527 5\' 4"  (1.626 m)     Head Circumference --      Peak Flow --      Pain Score 08/21/19 1527 6     Pain Loc --      Pain Edu? --      Excl. in GC? --    No data found.  Updated Vital Signs BP (!) 144/90 (BP Location: Right Arm)   Pulse 85   Temp 98.2 F (36.8 C) (Oral)   Resp 14   Ht 5\' 4"  (1.626 m)   Wt (!) 354 lb (160.6 kg)   LMP 05/21/2019  Comment: Pregnancy test Negative on 08/20/19  SpO2 100%   Breastfeeding No   BMI 60.76 kg/m   Visual Acuity Right Eye  Distance:   Left Eye Distance:   Bilateral Distance:    Right Eye Near:   Left Eye Near:    Bilateral Near:     Physical Exam Constitutional:      General: She is not in acute distress.    Appearance: She is well-developed.  Cardiovascular:     Rate and Rhythm: Normal rate.  Pulmonary:     Effort: Pulmonary effort is normal.  Musculoskeletal:     Lumbar back: Tenderness present. No bony tenderness. Normal range of motion. Negative right straight leg raise test and negative left straight leg raise test.  Skin:    General: Skin is warm and dry.  Neurological:     Mental Status: She is alert and oriented to person, place, and time.      UC Treatments / Results  Labs (all labs ordered are listed, but only abnormal results are displayed) Labs Reviewed  URINALYSIS, COMPLETE (UACMP) WITH MICROSCOPIC - Abnormal; Notable for the following components:      Result Value   Specific Gravity, Urine <1.005 (*)    All other components within normal limits  BASIC METABOLIC PANEL - Abnormal; Notable for the following components:   Glucose, Bld 135 (*)    All other components within normal limits    EKG   Radiology No results found.  Procedures Procedures (including critical care time)  Medications Ordered in UC Medications - No data to display  Initial Impression / Assessment and Plan / UC Course  I have reviewed the triage vital signs and the nursing notes.  Pertinent labs & imaging results that were available during my care of the patient were reviewed by me and considered in my medical decision making (see chart for details).     Low back musculature with tenderness, mild pain with flexion of back. Ambulatory without difficulty. No red flag findings. No traumatic injury. Urine and creatinine/ gfr look well here today without acute findings. Education  provided about testing completed today and testing done with pcp yesterday. Encouraged continued back pain management and diabetes management, as pcp will then trend values collected yesterday. Return precautions provided. Patient verbalized understanding and agreeable to plan.  Ambulatory out of clinic without difficulty.    Final Clinical Impressions(s) / UC Diagnoses   Final diagnoses:  Bilateral low back pain without sciatica, unspecified chronicity  Laboratory test     Discharge Instructions     Your urine looks good here today without any indication of kidney infection and no large protein visualized.  I do not feel that your back pain is related to your lab value collected by your doctor.  The value your doctor collected is monitoring your diabetes control, so continue to monitor your diet and exercise to keep good control of this.  This value will likely be monitored and trended over time.  Tylenol as needed for back pain, muscle relaxer at night as needed to help with back pain.  See exercises provided for your back pain.  Continue to follow up with your primary care provider for further evaluation and management as needed.    ED Prescriptions    Medication Sig Dispense Auth. Provider   acetaminophen (TYLENOL) 500 MG tablet Take 2 tablets (1,000 mg total) by mouth every 8 (eight) hours as needed. 30 tablet Augusto Gamble B, NP   cyclobenzaprine (FLEXERIL) 5 MG tablet Take 1 tablet (5 mg total) by mouth at bedtime. 15 tablet Zigmund Gottron, NP     PDMP not  reviewed this encounter.   Georgetta Haber, NP 08/21/19 1624

## 2019-08-21 NOTE — ED Triage Notes (Signed)
Patient states that she was seen at her [facility] PCP yesterday for lower back pain and was told that she had protein in her urine and was that she had some kidney damage.  Patient states that she was told by her PCP that she need to follow-up and have a urinalysis done within 24 hours.  Patient states that her PCP did not have any appointments for today.  Patient states that she does have an appointment to see her PCP on Monday. Patient denies fevers.  Patient denies N/V.

## 2019-10-04 ENCOUNTER — Encounter: Payer: Self-pay | Admitting: Emergency Medicine

## 2019-10-04 ENCOUNTER — Other Ambulatory Visit: Payer: Self-pay

## 2019-10-04 ENCOUNTER — Emergency Department
Admission: EM | Admit: 2019-10-04 | Discharge: 2019-10-04 | Disposition: A | Discharge status: Home / Self Care | Payer: Medicaid Other | Attending: Emergency Medicine | Admitting: Emergency Medicine

## 2019-10-04 DIAGNOSIS — R109 Unspecified abdominal pain: Secondary | ICD-10-CM

## 2019-10-04 DIAGNOSIS — N390 Urinary tract infection, site not specified: Secondary | ICD-10-CM | POA: Diagnosis not present

## 2019-10-04 DIAGNOSIS — Z79899 Other long term (current) drug therapy: Secondary | ICD-10-CM | POA: Diagnosis not present

## 2019-10-04 DIAGNOSIS — Z7984 Long term (current) use of oral hypoglycemic drugs: Secondary | ICD-10-CM | POA: Insufficient documentation

## 2019-10-04 DIAGNOSIS — F17210 Nicotine dependence, cigarettes, uncomplicated: Secondary | ICD-10-CM | POA: Insufficient documentation

## 2019-10-04 DIAGNOSIS — E119 Type 2 diabetes mellitus without complications: Secondary | ICD-10-CM | POA: Diagnosis not present

## 2019-10-04 DIAGNOSIS — R1030 Lower abdominal pain, unspecified: Secondary | ICD-10-CM | POA: Diagnosis present

## 2019-10-04 LAB — URINALYSIS, COMPLETE (UACMP) WITH MICROSCOPIC
Bilirubin Urine: NEGATIVE
Glucose, UA: NEGATIVE mg/dL
Ketones, ur: NEGATIVE mg/dL
Nitrite: POSITIVE — AB
Protein, ur: NEGATIVE mg/dL
Specific Gravity, Urine: 1.011 (ref 1.005–1.030)
WBC, UA: >50 WBC/hpf — ABNORMAL HIGH (ref 0–5)
pH: 6 (ref 5.0–8.0)

## 2019-10-04 LAB — POCT PREGNANCY, URINE: Preg Test, Ur: NEGATIVE

## 2019-10-04 MED ORDER — CEPHALEXIN 500 MG PO CAPS
500.0000 mg | ORAL_CAPSULE | Freq: Once | ORAL | Status: AC
  Administered 2019-10-04: 500 mg via ORAL
  Filled 2019-10-04: qty 1

## 2019-10-04 MED ORDER — CEPHALEXIN 500 MG PO CAPS: 500.0000 mg | ORAL_CAPSULE | Freq: Three times a day (TID) | ORAL | 0 refills | Status: AC

## 2019-10-04 NOTE — Discharge Instructions (Signed)
Take the antibiotic as directed. Drink plenty of fluids (cranberry juice) and empty your bladder on schedule. Follow-up with Dr. Val Eagle if your symptoms continue. Return to the ED as needed.

## 2019-10-04 NOTE — ED Triage Notes (Signed)
Pt to triage with c/o bilateral flank pain for last 2 weeks.  States last two days urinary frequency and "tingling".

## 2019-10-04 NOTE — ED Provider Notes (Signed)
[facility] Emergency Department Provider Note ____________________________________________  Time seen: 1925  I have reviewed the triage vital signs and the nursing notes.  HISTORY  Chief Complaint  Flank Pain and Dysuria  HPI Latasha Alexander is a 29 y.o. female presents herself to the ED for evaluation of 2-week complaint of intermittent but persistent flank pain.   Patient reports over the last 2 days however, she is experienced urinary frequency and tingling.  She has been taking over-the-counter Pyridium with significant improvement of her symptoms overall.  She denies any frank hematuria, nausea, vomiting, or diarrhea.  Patient also denies any pelvic pain, abdominal pain, or abnormal vaginal bleeding.  She denies a history of kidney stones and also denies any persistent or recurrent UTI or pyelonephritis.  Past Medical History:  Diagnosis Date  . Anxiety   . Depression   . Diabetes mellitus without complication (HCC)   . Hypercholesterolemia   . OCD (obsessive compulsive disorder)   . Panic attacks     Patient Active Problem List   Diagnosis Date Noted  . OCD (obsessive compulsive disorder) 07/17/2016  . Severe recurrent major depression without psychotic features (HCC) 07/16/2016  . Pregnant 07/16/2016  . Anxiety and depression 07/08/2016  . Nausea and vomiting during pregnancy 07/08/2016  . Urinary symptom or sign 07/08/2016  . Supervision of high risk pregnancy in first trimester 07/08/2016  . Morbid obesity with BMI of 45.0-49.9, adult (HCC) 06/06/2016    Past Surgical History:  Procedure Laterality Date  . CESAREAN SECTION    . NO PAST SURGERIES    . none      Prior to Admission medications   Medication Sig Start Date End Date Taking? Authorizing Provider  acetaminophen (TYLENOL) 500 MG tablet Take 2 tablets (1,000 mg total) by mouth every 8 (eight) hours as needed. 08/21/19   Linus Mako B, NP  ARIPiprazole (ABILIFY) 10 MG tablet  Take 10 mg by mouth daily.    [provider]  atorvastatin (LIPITOR) 10 MG tablet Take 10 mg by mouth daily.    [provider]  cyclobenzaprine (FLEXERIL) 5 MG tablet Take 1 tablet (5 mg total) by mouth at bedtime. 08/21/19   Georgetta Haber, NP  fluticasone (FLONASE) 50 MCG/ACT nasal spray Place 2 sprays into both nostrils daily. Patient not taking: Reported on 04/29/2019 08/09/16   Domenick Gong, MD  folic acid (FOLVITE) 1 MG tablet Take 2 tablets (2 mg total) by mouth daily. Patient not taking: Reported on 04/29/2019 07/20/16   Pucilowska, Braulio Conte B, MD  glipiZIDE (GLUCOTROL XL) 5 MG 24 hr tablet Take 5 mg by mouth daily with breakfast.    [provider]  LATUDA 40 MG TABS tablet TAKE 1 TABLET BY MOUTH EVERY DAY WITH SUPPER Patient not taking: No sig reported 08/20/16   Hildred Laser, MD  magic mouthwash w/lidocaine SOLN Take 5 mLs by mouth 4 (four) times daily. Swish and swallow Patient not taking: Reported on 04/29/2019 07/04/17   Joni Reining, PA-C  Melatonin 5 MG TABS Take 1 tablet (5 mg total) by mouth at bedtime. Patient not taking: Reported on 09/13/2016 07/19/16   Pucilowska, Braulio Conte B, MD  metFORMIN (GLUCOPHAGE) 500 MG tablet Take 500 mg by mouth 2 (two) times daily. 03/13/19   [provider]  methylPREDNISolone (MEDROL DOSEPAK) 4 MG TBPK tablet Take Tapered dose as directed Patient not taking: Reported on 04/29/2019 07/04/17   Joni Reining, PA-C  norethindrone-ethinyl estradiol (LOESTRIN) 1-20 MG-MCG tablet Take  by mouth. 08/20/19 08/19/20  [provider]  ondansetron (ZOFRAN ODT) 4 MG disintegrating tablet Take 1 tablet (4 mg total) by mouth every 6 (six) hours as needed for nausea. Patient not taking: Reported on 04/29/2019 09/24/16   Gunnar Bulla, CNM  promethazine (PHENERGAN) 12.5 MG tablet Take 1 tablet (12.5 mg total) by mouth every 6 (six) hours as needed for nausea or vomiting. Patient not taking: Reported on 04/29/2019  07/05/16   Hildred Laser, MD  sertraline (ZOLOFT) 100 MG tablet Take 100 mg by mouth 2 (two) times daily.    [provider]    Allergies Cashew nut oil  Family History  Problem Relation Age of Onset  . Diabetes Mother   . Hypertension Mother   . Hypertension Sister   . Stroke Maternal Grandfather     Social History Social History   Tobacco Use  . Smoking status: Current Some Day Smoker    Types: Cigarettes  . Smokeless tobacco: Never Used  Substance Use Topics  . Alcohol use: No    Alcohol/week: 0.0 standard drinks  . Drug use: No    Review of Systems  Constitutional: Negative for fever. Cardiovascular: Negative for chest pain. Respiratory: Negative for shortness of breath. Gastrointestinal: Negative for abdominal pain, vomiting and diarrhea. Genitourinary: Positive for dysuria and bilateral flank pain. Musculoskeletal: Negative for back pain. Skin: Negative for rash. Neurological: Negative for headaches, focal weakness or numbness. ____________________________________________  PHYSICAL EXAM:  VITAL SIGNS: ED Triage Vitals  Enc Vitals Group     BP 10/04/19 1747 137/88     Pulse Rate 10/04/19 1747 88     Resp 10/04/19 1747 18     Temp 10/04/19 1747 99.1 F (37.3 C)     Temp Source 10/04/19 1747 Oral     SpO2 10/04/19 1747 99 %     Weight 10/04/19 1748 (!) 352 lb (159.7 kg)     Height 10/04/19 1748 5\' 5"  (1.651 m)     Head Circumference --      Peak Flow --      Pain Score 10/04/19 1748 4     Pain Loc --      Pain Edu? --      Excl. in [place]? --     Constitutional: Alert and oriented. Well appearing and in no distress. Head: Normocephalic and atraumatic. Eyes: Conjunctivae are normal. Normal extraocular movements Cardiovascular: Normal rate, regular rhythm. Normal distal pulses. Respiratory: Normal respiratory effort. No wheezes/rales/rhonchi. Gastrointestinal: Soft and nontender. No distention or rebound, guarding, or rigidity.  No CVA  tenderness elicited. Musculoskeletal: Nontender with normal range of motion in all extremities.  Neurologic:  Normal gait without ataxia. Normal speech and language. No gross focal neurologic deficits are appreciated. Skin:  Skin is warm, dry and intact. No rash noted. ____________________________________________   LABS (pertinent positives/negatives) Labs Reviewed  URINALYSIS, COMPLETE (UACMP) WITH MICROSCOPIC - Abnormal; Notable for the following components:      Result Value   Color, Urine AMBER (*)    APPearance CLEAR (*)    Hgb urine dipstick LARGE (*)    Nitrite POSITIVE (*)    Leukocytes,Ua MODERATE (*)    WBC, UA >50 (*)    Bacteria, UA MANY (*)    All other components within normal limits  URINE CULTURE  POC URINE PREG, ED  POCT PREGNANCY, URINE  ____________________________________________  PROCEDURES  Keflex 500 mg PO  Procedures ____________________________________________  INITIAL IMPRESSION / ASSESSMENT AND PLAN / ED COURSE  Patient  with ED evaluation of dysuria and flank pain.  Urinalysis does show leukocyturia as well as nitrite.  Patient has a urine culture pending at this time peer we will treat her empirically with Keflex and she is encouraged to follow-up with primary provider.  No indication of acute pyelonephritis at this time.  Patient is afebrile and is without any signs of urinary retention.  She is stable for outpatient management of her cystitis.  Return precautions have been discussed.  Leonides Cave Bobier was evaluated in Emergency Department on 10/04/2019 for the symptoms described in the history of present illness. She was evaluated in the context of the global COVID-19 pandemic, which necessitated consideration that the patient might be at risk for infection with the SARS-CoV-2 virus that causes COVID-19. Institutional protocols and algorithms that pertain to the evaluation of patients at risk for COVID-19 are in a state of rapid change based on  information released by regulatory bodies including the CDC and federal and state organizations. These policies and algorithms were followed during the patient's care in the ED. ____________________________________________  FINAL CLINICAL IMPRESSION(S) / ED DIAGNOSES  Final diagnoses:  Lower urinary tract infectious disease  Flank pain      Jairy Angulo, Dannielle Karvonen, PA-C 10/05/19 0010    Vanessa Amboy, MD 10/05/19 [phone]

## 2019-10-07 LAB — URINE CULTURE: Culture: >=100000 — AB

## 2019-12-03 ENCOUNTER — Encounter: Payer: Self-pay | Admitting: Emergency Medicine

## 2019-12-03 ENCOUNTER — Ambulatory Visit
Admission: EM | Admit: 2019-12-03 | Discharge: 2019-12-03 | Disposition: A | Discharge status: Home / Self Care | Payer: Medicaid Other | Attending: Family Medicine | Admitting: Family Medicine

## 2019-12-03 ENCOUNTER — Other Ambulatory Visit: Payer: Self-pay

## 2019-12-03 DIAGNOSIS — N644 Mastodynia: Secondary | ICD-10-CM | POA: Diagnosis present

## 2019-12-03 DIAGNOSIS — R1030 Lower abdominal pain, unspecified: Secondary | ICD-10-CM | POA: Diagnosis present

## 2019-12-03 DIAGNOSIS — B9689 Other specified bacterial agents as the cause of diseases classified elsewhere: Secondary | ICD-10-CM | POA: Diagnosis present

## 2019-12-03 DIAGNOSIS — N760 Acute vaginitis: Secondary | ICD-10-CM | POA: Diagnosis present

## 2019-12-03 LAB — URINALYSIS, COMPLETE (UACMP) WITH MICROSCOPIC
Bacteria, UA: NONE SEEN
Bilirubin Urine: NEGATIVE
Glucose, UA: NEGATIVE mg/dL
Hgb urine dipstick: NEGATIVE
Ketones, ur: NEGATIVE mg/dL
Leukocytes,Ua: NEGATIVE
Nitrite: NEGATIVE
Protein, ur: NEGATIVE mg/dL
Specific Gravity, Urine: >1.03 — ABNORMAL HIGH (ref 1.005–1.030)
WBC, UA: NONE SEEN WBC/hpf (ref 0–5)
pH: 6 (ref 5.0–8.0)

## 2019-12-03 LAB — WET PREP, GENITAL
Sperm: NONE SEEN
Trich, Wet Prep: NONE SEEN
WBC, Wet Prep HPF POC: NONE SEEN
Yeast Wet Prep HPF POC: NONE SEEN

## 2019-12-03 LAB — PREGNANCY, URINE: Preg Test, Ur: NEGATIVE

## 2019-12-03 MED ORDER — METRONIDAZOLE 500 MG PO TABS: 500.0000 mg | ORAL_TABLET | Freq: Two times a day (BID) | ORAL | 0 refills | Status: DC

## 2019-12-03 NOTE — Discharge Instructions (Signed)
Medication as prescribed.  Please follow up with your PCP.  Take care  Dr. Terral Cooks  

## 2019-12-03 NOTE — ED Provider Notes (Signed)
[facility]    CSN: [id number] Arrival date & time: 12/03/19  1118   History   Chief Complaint Chief Complaint  Patient presents with  . Abdominal Pain  . Back Pain  . Breast Pain    HPI  29 year old female presents with multiple complaints.  Patient reports a 3-day history of lower abdominal pain as well as upper abdominal pain.  She also notes low back pain and bilateral breast pain which she describes as an achy sensation.  Patient also reports that she is having urinary frequency and clear vaginal discharge.  Recently seen by her primary care physician and had a negative pregnancy test.  She reports that she has taken Advil, magnesium, Pepto-Bismol, and Tums without relief in her symptoms.  No reports of fever.  No nausea, vomiting, diarrhea.  Pain is severe per her report.  No other associated symptoms.  No other complaints.  Past Medical History:  Diagnosis Date  . Anxiety   . Depression   . Diabetes mellitus without complication (HCC)   . Hypercholesterolemia   . OCD (obsessive compulsive disorder)   . Panic attacks     Patient Active Problem List   Diagnosis Date Noted  . OCD (obsessive compulsive disorder) 07/17/2016  . Severe recurrent major depression without psychotic features (HCC) 07/16/2016  . Pregnant 07/16/2016  . Anxiety and depression 07/08/2016  . Nausea and vomiting during pregnancy 07/08/2016  . Urinary symptom or sign 07/08/2016  . Supervision of high risk pregnancy in first trimester 07/08/2016  . Morbid obesity with BMI of 45.0-49.9, adult (HCC) 06/06/2016    Past Surgical History:  Procedure Laterality Date  . CESAREAN SECTION    . none      OB History    Gravida  1   Para      Term      Preterm      AB      Living  0     SAB      TAB      Ectopic      Multiple      Live Births               Home Medications    Prior to Admission medications   Medication Sig Start Date End Date Taking? Authorizing  Provider  acetaminophen (TYLENOL) 500 MG tablet Take 2 tablets (1,000 mg total) by mouth every 8 (eight) hours as needed. 08/21/19  Yes Burky, Dorene Grebe B, NP  ARIPiprazole (ABILIFY) 10 MG tablet Take 10 mg by mouth daily.   Yes [provider]  atorvastatin (LIPITOR) 10 MG tablet Take 10 mg by mouth daily.   Yes [provider]  glipiZIDE (GLUCOTROL XL) 5 MG 24 hr tablet Take 5 mg by mouth daily with breakfast.   Yes [provider]  Magnesium 500 MG TABS Take 1 tablet by mouth daily.   Yes [provider]  metFORMIN (GLUCOPHAGE) 500 MG tablet Take 500 mg by mouth 2 (two) times daily. 03/13/19  Yes [provider]  norethindrone-ethinyl estradiol (LOESTRIN) 1-20 MG-MCG tablet Take by mouth. 08/20/19 08/19/20 Yes [provider]  sertraline (ZOLOFT) 100 MG tablet Take 100 mg by mouth 2 (two) times daily.   Yes [provider]  metroNIDAZOLE (FLAGYL) 500 MG tablet Take 1 tablet (500 mg total) by mouth 2 (two) times daily. 12/03/19   Tommie Sams, DO  fluticasone (FLONASE) 50 MCG/ACT nasal spray Place 2 sprays into both nostrils daily. Patient not  taking: Reported on 04/29/2019 08/09/16 12/03/19  Melynda Ripple, MD  LATUDA 40 MG TABS tablet TAKE 1 TABLET BY MOUTH EVERY DAY WITH SUPPER Patient not taking: No sig reported 08/20/16 12/03/19  Rubie Maid, MD  promethazine (PHENERGAN) 12.5 MG tablet Take 1 tablet (12.5 mg total) by mouth every 6 (six) hours as needed for nausea or vomiting. Patient not taking: Reported on 04/29/2019 07/05/16 12/03/19  Rubie Maid, MD    Family History Family History  Problem Relation Age of Onset  . Diabetes Mother   . Hypertension Mother   . Hypertension Sister   . Stroke Maternal Grandfather     Social History Social History   Tobacco Use  . Smoking status: Current Every Day Smoker    Packs/day: 0.25    Types: Cigarettes  . Smokeless tobacco: Never Used  Vaping Use  . Vaping Use: Never used    Substance Use Topics  . Alcohol use: No    Alcohol/week: 0.0 standard drinks  . Drug use: No     Allergies   Cashew nut oil   Review of Systems Review of Systems Per HPI  Physical Exam Triage Vital Signs ED Triage Vitals  Enc Vitals Group     BP 12/03/19 1135 (!) 149/98     Pulse Rate 12/03/19 1135 82     Resp 12/03/19 1135 18     Temp 12/03/19 1135 98.4 F (36.9 C)     Temp Source 12/03/19 1135 Oral     SpO2 12/03/19 1135 100 %     Weight 12/03/19 1136 (!) 350 lb (158.8 kg)     Height 12/03/19 1136 5\' 5"  (1.651 m)     Head Circumference --      Peak Flow --      Pain Score 12/03/19 1135 9     Pain Loc --      Pain Edu? --      Excl. in [place]? --    Updated Vital Signs BP (!) 149/98 (BP Location: Left Arm) Comment (BP Location): forearm  Pulse 82   Temp 98.4 F (36.9 C) (Oral)   Resp 18   Ht 5\' 5"  (1.651 m)   Wt (!) 158.8 kg   LMP 11/21/2019 (Exact Date)   SpO2 100%   BMI 58.24 kg/m   Visual Acuity Right Eye Distance:   Left Eye Distance:   Bilateral Distance:    Right Eye Near:   Left Eye Near:    Bilateral Near:     Physical Exam Vitals and nursing note reviewed.  Constitutional:      General: She is not in acute distress.    Appearance: She is well-developed. She is obese. She is not ill-appearing.  HENT:     Head: Normocephalic and atraumatic.  Eyes:     General:        Right eye: No discharge.        Left eye: No discharge.     Conjunctiva/sclera: Conjunctivae normal.  Cardiovascular:     Rate and Rhythm: Normal rate and regular rhythm.     Heart sounds: No murmur heard.   Pulmonary:     Effort: Pulmonary effort is normal.     Breath sounds: Normal breath sounds. No wheezing or rales.  Abdominal:     General: There is no distension.     Palpations: Abdomen is soft.     Comments: Mild tenderness in the suprapubic region.  Neurological:     Mental Status: She is alert.  Psychiatric:        Mood and Affect: Mood normal.         Behavior: Behavior normal.    UC Treatments / Results  Labs (all labs ordered are listed, but only abnormal results are displayed) Labs Reviewed  WET PREP, GENITAL - Abnormal; Notable for the following components:      Result Value   Clue Cells Wet Prep HPF POC PRESENT (*)    All other components within normal limits  URINALYSIS, COMPLETE (UACMP) WITH MICROSCOPIC - Abnormal; Notable for the following components:   Specific Gravity, Urine >1.030 (*)    All other components within normal limits  PREGNANCY, URINE    EKG   Radiology No results found.  Procedures Procedures (including critical care time)  Medications Ordered in UC Medications - No data to display  Initial Impression / Assessment and Plan / UC Course  I have reviewed the triage vital signs and the nursing notes.  Pertinent labs & imaging results that were available during my care of the patient were reviewed by me and considered in my medical decision making (see chart for details).    29 year old female presents with multiple complaints.  Urine pregnancy test negative.  Urinalysis not consistent with UTI.  Wet prep revealed BV.  Bacterial vaginosis does not account for all of her symptoms.  Treating with Flagyl.  Advised to follow-up with primary care physician.  Final Clinical Impressions(s) / UC Diagnoses   Final diagnoses:  Bacterial vaginosis  Breast pain  Lower abdominal pain     Discharge Instructions     Medication as prescribed.  Please follow up with your PCP.  Take care  Dr. Adriana Simas    ED Prescriptions    Medication Sig Dispense Auth. Provider   metroNIDAZOLE (FLAGYL) 500 MG tablet Take 1 tablet (500 mg total) by mouth 2 (two) times daily. 14 tablet Tommie Sams, DO     PDMP not reviewed this encounter.   Tommie Sams, DO 12/03/19 1359

## 2019-12-03 NOTE — ED Triage Notes (Addendum)
Patient today c/o abdominal pain, low back pain and bilateral breast pain x 3 days. Patient also states she is having urinary frequency and a clear vaginal discharge. Patient denies fever. Patient has used OTC Advil, Magnesium, Pepto Bismol and Tums without relief. Patient states she had a period 11/21/19, but it was not a normal period for her.  Patient states that she has gallstones. Patient was STD tested ~2-3 weeks ago and was negative. Patient is in a monogamous relationship.

## 2019-12-15 ENCOUNTER — Ambulatory Visit: Payer: Self-pay | Admitting: General Surgery

## 2019-12-15 NOTE — H&P (Signed)
HISTORY OF PRESENT ILLNESS:    [name] is a 29 y.o.female patient who comes for evaluation of cholelithiasis.  Patient with cholelithiasis confirmed on ultrasound.  She reports that she continued having severe and recurrent right upper quadrant pain.  The pain is aggravated by food intake.  There is no alleviating factors.  The pain radiates to her right back and up to the right shoulder.  The pain is associated with nausea.  She has been trying to found a bariatric service but none of them covered her insurance.  She has lost 20 pounds in the last couple of months trying to eat better and being more active.      PAST MEDICAL HISTORY:  Past Medical History:  Diagnosis Date  . Abdominal pain of unknown etiology, unspecified   . Acid reflux   . Anxiety   . Cholecystitis   . Depression   . History of OCD (obsessive compulsive disorder)   . History of panic attacks   . Type 2 diabetes mellitus (CMS-HCC)         PAST SURGICAL HISTORY:   Past Surgical History:  Procedure Laterality Date  . CESAREAN DELIVERY N/A 01/19/2017   Procedure: CESAREAN DELIVERY ONLY;  Surgeon: Karie Georges, MD;  Location: [place];  Service: Obstetrics;  Laterality: N/A;  . PR CESAREAN DELIVERY ONLY           MEDICATIONS:  Outpatient Encounter Medications as of 12/15/2019  Medication Sig Dispense Refill  . ABILIFY 10 mg tablet Patient taking 81m QD with plan with psychiatrist to increase to 178mQD 30 tablet 0  . ACCU-CHEK FASTCLIX LANCET DRUM     . atorvastatin (LIPITOR) 10 MG tablet Take 1 tablet (10 mg total) by mouth once daily 30 tablet 11  . blood glucose diagnostic test strip Use to check fasting blood sugars once daily or if you feel that your sugar is too high/low. E11.9 100 each 12  . lancing device with lancets kit Use to check fasting blood sugars once daily or if you feel that your sugar is too high/low. E11.9 50 each 12  . metFORMIN (GLUCOPHAGE) 500 MG tablet Take 1  tablet (500 mg total) by mouth 2 (two) times daily with meals (Patient taking differently: Take 500 mg by mouth daily with breakfast   ) 60 tablet 1  . norethindrone-ethinyl estradiol (JUNEL 1/20) 1-20 mg-mcg tablet Take 1 tablet by mouth once daily Take daily for 21 days, no drug for 7 days, then start new pack. 1 Package 11  . sertraline (ZOLOFT) 100 MG tablet TK 2 TS PO D  2   No facility-administered encounter medications on file as of 12/15/2019.     ALLERGIES:   Cashew nut   SOCIAL HISTORY:  Social History   Socioeconomic History  . Marital status: Single    Spouse name: Not on file  . Number of children: Not on file  . Years of education: Not on file  . Highest education level: Not on file  Occupational History  . Not on file  Tobacco Use  . Smoking status: Light Tobacco Smoker    Packs/day: 0.00    Years: 0.00    Pack years: 0.00  . Smokeless tobacco: Never Used  . Tobacco comment: vapes sometimes  Vaping Use  . Vaping Use: Some days  Substance and Sexual Activity  . Alcohol use: Not Currently    Comment: occ  . Drug use: No  . Sexual activity: Yes  Partners: Male  Other Topics Concern  . Not on file  Social History Narrative   Single, G0   Currently unemployed. Lives with parents and siblings   Denies tobacco/ETOH/drug use    Caffeine: none   Exercise: walking daily   Social Determinants of Health   Financial Resource Strain:   . Difficulty of Paying Living Expenses:   Food Insecurity:   . Worried About Charity fundraiser in the Last Year:   . Arboriculturist in the Last Year:   Transportation Needs:   . Film/video editor (Medical):   Marland Kitchen Lack of Transportation (Non-Medical):     FAMILY HISTORY:  Family History  Problem Relation Age of Onset  . High blood pressure (Hypertension) Mother   . Diabetes Mother   . Anxiety Mother   . High blood pressure (Hypertension) Sister   . Diabetes type II Maternal Grandmother   . Stroke Maternal  Grandfather   . High blood pressure (Hypertension) Sister      GENERAL REVIEW OF SYSTEMS:   General ROS: negative for - chills, fatigue, fever, weight gain or weight loss Allergy and Immunology ROS: negative for - hives  Hematological and Lymphatic ROS: negative for - bleeding problems or bruising, negative for palpable nodes Endocrine ROS: negative for - heat or cold intolerance, hair changes Respiratory ROS: negative for - cough, shortness of breath or wheezing Cardiovascular ROS: no chest pain or palpitations GI ROS: negative for vomiting, diarrhea, positive for abdominal pain and constipation Musculoskeletal ROS: negative for - joint swelling or muscle pain Neurological ROS: negative for - confusion, syncope Dermatological ROS: negative for pruritus and rash  PHYSICAL EXAM:  Vitals:   12/15/19 0740  BP: 135/85  Pulse: 69  .  Ht:163.8 cm (5' 4.5") Wt:(!) 158.8 kg (350 lb) UJW:JXBJ surface area is 2.69 meters squared. Body mass index is 59.15 kg/m.Marland Kitchen   GENERAL: Alert, active, oriented x3  HEENT: Pupils equal reactive to light. Extraocular movements are intact. Sclera clear. Palpebral conjunctiva normal red color.Pharynx clear.  NECK: Supple with no palpable mass and no adenopathy.  LUNGS: Sound clear with no rales rhonchi or wheezes.  HEART: Regular rhythm S1 and S2 without murmur.  ABDOMEN: Soft and depressible, nontender with no palpable mass, no hepatomegaly.   EXTREMITIES: Well-developed well-nourished symmetrical with no dependent edema.  NEUROLOGICAL: Awake alert oriented, facial expression symmetrical, moving all extremities.      IMPRESSION:     Calculus of gallbladder without cholecystitis without obstruction [K80.20]   Patient was oriented again about the diagnosis of cholelithiasis. Also oriented about what is the gallbladder, its anatomy and function and the implications of having stones. The patient was oriented about the treatment alternatives  (observation vs cholecystectomy). Patient was oriented that a low percentage of patient will continue to have similar pain symptoms even after the gallbladder is removed. Surgical technique (open vs laparoscopic) was discussed. It was also discussed the goals of the surgery (decrease the pain episodes and avoid the risk of cholecystitis) and the risk of surgery including: bleeding, infection, common bile duct injury, stone retention, injury to other organs such as bowel, liver, stomach, other complications such as hernia, bowel obstruction among others. Also discussed with patient about anesthesia and its complications such as: reaction to medications, pneumonia, heart complications, death, among others.   Due to patient persistent symptoms and not being able to get weight loss clinic evaluation due to her insurance I think that it is reasonable to proceed with  minimally invasive cholecystectomy.  I discussed with the patient the high risk of complications such as DVT, pneumonia, infections, bleeding due to her obesity.  She understood and agreed to proceed.           PLAN:  1.  Robotic assisted laparoscopic cholecystectomy ([id number]) 2.  CBC, CMP 3.  Do not take aspirin 5 days before the procedure 4.  Contact us if has any question or concern.  Patient verbalized understanding, all questions were answered, and were agreeable with the plan outlined above.   Herbert Pun, MD  Electronically signed by Herbert Pun, MD

## 2019-12-18 ENCOUNTER — Other Ambulatory Visit: Payer: Self-pay

## 2019-12-18 ENCOUNTER — Encounter
Admission: RE | Admit: 2019-12-18 | Discharge: 2019-12-18 | Disposition: A | Discharge status: Home / Self Care | Payer: Medicaid Other | Source: Ambulatory Visit | Attending: General Surgery | Admitting: General Surgery

## 2019-12-18 HISTORY — DX: Nausea with vomiting, unspecified: R11.2

## 2019-12-18 HISTORY — DX: Heartburn: R12

## 2019-12-18 HISTORY — DX: Nausea with vomiting, unspecified: Z98.890

## 2019-12-18 NOTE — Pre-Procedure Instructions (Signed)
Copied from [facility]    ECG 12 lead (Adult)   Ref Range & Units 8 mo ago  EKG Systolic BP mmHg      EKG Diastolic BP mmHg      EKG Ventricular Rate BPM 72      EKG Atrial Rate BPM 72      EKG P-R Interval ms 142      EKG QRS Duration ms 98      EKG Q-T Interval ms 398      EKG QTC Calculation ms 435      EKG Calculated P Axis degrees 21      EKG Calculated R Axis degrees 23      EKG Calculated T Axis degrees 15      QTC Fredericia ms 423      Narrative  NORMAL SINUS RHYTHM  NORMAL ECG  NO PREVIOUS ECGS AVAILABLE  Confirmed by Schuyler Amor ([id number]) on 03/24/2019 4:31:01 PM Other Result Text  Interface, Rad Results In - 03/24/2019 4:31 PM EDT  NORMAL SINUS RHYTHM  NORMAL ECG  NO PREVIOUS ECGS AVAILABLE  Confirmed by Schuyler Amor [phone]) on 03/24/2019 4:31:01 PM Specimen Collected: --  Date: 03/24/19  Received From: [facility]

## 2019-12-18 NOTE — Patient Instructions (Signed)
Your procedure is scheduled on: 12/23/19 Report to [place]. To find out your arrival time please call [phone] between 1PM - 3PM on 12/22/19.  Remember: Instructions that are not followed completely may result in serious medical risk, up to and including death, or upon the discretion of your surgeon and anesthesiologist your surgery may need to be rescheduled.     _X__ 1. Do not eat food after midnight the night before your procedure.                 No gum chewing or hard candies. You may drink clear liquids up to 2 hours                 before you are scheduled to arrive for your surgery- DO not drink clear                 liquids within 2 hours of the start of your surgery.                 Clear Liquids include:  water, apple juice without pulp, clear carbohydrate                 drink such as Clearfast or Gatorade, Black Coffee or Tea (Do not add                 anything to coffee or tea). Diabetics water only  __X__2.  On the morning of surgery brush your teeth with toothpaste and water, you                 may rinse your mouth with mouthwash if you wish.  Do not swallow any              toothpaste of mouthwash.     _X__ 3.  No Alcohol for 24 hours before or after surgery.   _X__ 4.  Do Not Smoke or use e-cigarettes For 24 Hours Prior to Your Surgery.                 Do not use any chewable tobacco products for at least 6 hours prior to                 surgery.  ____  5.  Bring all medications with you on the day of surgery if instructed.   __X__  6.  Notify your doctor if there is any change in your medical condition      (cold, fever, infections).     Do not wear jewelry, make-up, hairpins, clips or nail polish. Do not wear lotions, powders, or perfumes.  Do not shave 48 hours prior to surgery. Men may shave face and neck. Do not bring valuables to the hospital.    [facility] is not responsible for any belongings or  valuables.  Contacts, dentures/partials or body piercings may not be worn into surgery. Bring a case for your contacts, glasses or hearing aids, a denture cup will be supplied. Leave your suitcase in the car. After surgery it may be brought to your room. For patients admitted to the hospital, discharge time is determined by your treatment team.   Patients discharged the day of surgery will not be allowed to drive home.   Please read over the following fact sheets that you were given:   MRSA Information  __X__ Take these medicines the morning of surgery with A SIP OF WATER:  1. NONE  2.   3.   4.  5.  6.  ____ Fleet Enema (as directed)   __X__ Use CHG Soap/SAGE wipes as directed  ____ Use inhalers on the day of surgery  __X__ Stop metformin/Janumet/Farxiga 2 days prior to surgery    ____ Take 1/2 of usual insulin dose the night before surgery. No insulin the morning          of surgery.   ____ Stop Blood Thinners Coumadin/Plavix/Xarelto/Pleta/Pradaxa/Eliquis/Effient/Aspirin  on   Or contact your Surgeon, Cardiologist or Medical Doctor regarding  ability to stop your blood thinners  __X__ Stop Anti-inflammatories 7 days before surgery such as Advil, Ibuprofen, Motrin,  BC or Goodies Powder, Naprosyn, Naproxen, Aleve, Aspirin    __X__ Stop all herbal supplements, fish oil or vitamin E until after surgery.    ____ Bring C-Pap to the hospital.

## 2019-12-21 ENCOUNTER — Other Ambulatory Visit: Admission: RE | Admit: 2019-12-21 | Payer: Medicaid Other | Source: Ambulatory Visit

## 2019-12-31 ENCOUNTER — Ambulatory Visit: Payer: Medicaid Other | Admitting: Dietician

## 2020-01-04 ENCOUNTER — Other Ambulatory Visit: Admission: RE | Admit: 2020-01-04 | Payer: Medicaid Other | Source: Ambulatory Visit

## 2020-01-06 ENCOUNTER — Ambulatory Visit: Admission: RE | Admit: 2020-01-06 | Payer: Medicaid Other | Source: Home / Self Care | Admitting: General Surgery

## 2020-01-06 ENCOUNTER — Encounter: Admission: RE | Payer: Self-pay | Source: Home / Self Care

## 2020-01-06 SURGERY — CHOLECYSTECTOMY, ROBOT-ASSISTED, LAPAROSCOPIC
Anesthesia: General | Site: Abdomen

## 2020-06-27 ENCOUNTER — Ambulatory Visit
Admission: EM | Admit: 2020-06-27 | Discharge: 2020-06-27 | Disposition: A | Discharge status: Home / Self Care | Payer: Medicaid Other | Attending: Family Medicine | Admitting: Family Medicine

## 2020-06-27 DIAGNOSIS — Z1152 Encounter for screening for COVID-19: Secondary | ICD-10-CM | POA: Diagnosis not present

## 2020-06-27 NOTE — Discharge Instructions (Signed)

## 2020-06-27 NOTE — ED Triage Notes (Signed)
Pt here for covid test. No symptoms.  

## 2020-06-30 LAB — COVID-19, FLU A+B NAA
Influenza A, NAA: NOT DETECTED
Influenza B, NAA: NOT DETECTED
SARS-CoV-2, NAA: NOT DETECTED

## 2020-08-18 ENCOUNTER — Other Ambulatory Visit: Payer: Self-pay

## 2020-08-18 ENCOUNTER — Other Ambulatory Visit (HOSPITAL_COMMUNITY)
Admission: RE | Admit: 2020-08-18 | Discharge: 2020-08-18 | Disposition: A | Discharge status: Home / Self Care | Payer: Medicaid Other | Source: Ambulatory Visit | Attending: Certified Nurse Midwife | Admitting: Certified Nurse Midwife

## 2020-08-18 ENCOUNTER — Ambulatory Visit (INDEPENDENT_AMBULATORY_CARE_PROVIDER_SITE_OTHER): Payer: Medicaid Other | Admitting: Certified Nurse Midwife

## 2020-08-18 VITALS — BP 126/80 | HR 83 | Resp 16 | Ht 64.0 in | Wt 332.1 lb

## 2020-08-18 DIAGNOSIS — N921 Excessive and frequent menstruation with irregular cycle: Secondary | ICD-10-CM | POA: Diagnosis not present

## 2020-08-18 DIAGNOSIS — N930 Postcoital and contact bleeding: Secondary | ICD-10-CM | POA: Diagnosis not present

## 2020-08-18 DIAGNOSIS — B373 Candidiasis of vulva and vagina: Secondary | ICD-10-CM | POA: Diagnosis not present

## 2020-08-18 NOTE — Patient Instructions (Signed)
Preventive Care 21-28 Years Old, Female Preventive care refers to lifestyle choices and visits with your health care provider that can promote health and wellness. This includes:  A yearly physical exam. This is also called an annual wellness visit.  Regular dental and eye exams.  Immunizations.  Screening for certain conditions.  Healthy lifestyle choices, such as: ? Eating a healthy diet. ? Getting regular exercise. ? Not using drugs or products that contain nicotine and tobacco. ? Limiting alcohol use. What can I expect for my preventive care visit? Physical exam Your health care provider may check your:  Height and weight. These may be used to calculate your BMI (body mass index). BMI is a measurement that tells if you are at a healthy weight.  Heart rate and blood pressure.  Body temperature.  Skin for abnormal spots. Counseling Your health care provider may ask you questions about your:  Past medical problems.  Family's medical history.  Alcohol, tobacco, and drug use.  Emotional well-being.  Home life and relationship well-being.  Sexual activity.  Diet, exercise, and sleep habits.  Work and work environment.  Access to firearms.  Method of birth control.  Menstrual cycle.  Pregnancy history. What immunizations do I need? Vaccines are usually given at various ages, according to a schedule. Your health care provider will recommend vaccines for you based on your age, medical history, and lifestyle or other factors, such as travel or where you work.   What tests do I need? Blood tests  Lipid and cholesterol levels. These may be checked every 5 years starting at age 20.  Hepatitis C test.  Hepatitis B test. Screening  Diabetes screening. This is done by checking your blood sugar (glucose) after you have not eaten for a while (fasting).  STD (sexually transmitted disease) testing, if you are at risk.  BRCA-related cancer screening. This may be  done if you have a family history of breast, ovarian, tubal, or peritoneal cancers.  Pelvic exam and Pap test. This may be done every 3 years starting at age 21. Starting at age 30, this may be done every 5 years if you have a Pap test in combination with an HPV test. Talk with your health care provider about your test results, treatment options, and if necessary, the need for more tests.   Follow these instructions at home: Eating and drinking  Eat a healthy diet that includes fresh fruits and vegetables, whole grains, lean protein, and low-fat dairy products.  Take vitamin and mineral supplements as recommended by your health care provider.  Do not drink alcohol if: ? Your health care provider tells you not to drink. ? You are pregnant, may be pregnant, or are planning to become pregnant.  If you drink alcohol: ? Limit how much you have to 0-1 drink a day. ? Be aware of how much alcohol is in your drink. In the [place] one drink equals one 12 oz bottle of beer (355 mL), one 5 oz glass of wine (148 mL), or one 1 oz glass of hard liquor (44 mL).   Lifestyle  Take daily care of your teeth and gums. Brush your teeth every morning and night with fluoride toothpaste. Floss one time each day.  Stay active. Exercise for at least 30 minutes 5 or more days each week.  Do not use any products that contain nicotine or tobacco, such as cigarettes, e-cigarettes, and chewing tobacco. If you need help quitting, ask your health care provider.  Do not   use drugs.  If you are sexually active, practice safe sex. Use a condom or other form of protection to prevent STIs (sexually transmitted infections).  If you do not wish to become pregnant, use a form of birth control. If you plan to become pregnant, see your health care provider for a prepregnancy visit.  Find healthy ways to cope with stress, such as: ? Meditation, yoga, or listening to music. ? Journaling. ? Talking to a trusted  person. ? Spending time with friends and family. Safety  Always wear your seat belt while driving or riding in a vehicle.  Do not drive: ? If you have been drinking alcohol. Do not ride with someone who has been drinking. ? When you are tired or distracted. ? While texting.  Wear a helmet and other protective equipment during sports activities.  If you have firearms in your house, make sure you follow all gun safety procedures.  Seek help if you have been physically or sexually abused. What's next?  Go to your health care provider once a year for an annual wellness visit.  Ask your health care provider how often you should have your eyes and teeth checked.  Stay up to date on all vaccines. This information is not intended to replace advice given to you by your health care provider. Make sure you discuss any questions you have with your health care provider. Document Revised: 02/07/2020 Document Reviewed: 02/20/2018 Elsevier Patient Education  2021 [employer].   Medroxyprogesterone injection [Contraceptive] What is this medicine? MEDROXYPROGESTERONE (me DROX ee proe JES te rone) contraceptive injections prevent pregnancy. They provide effective birth control for 3 months. Depo-SubQ Provera 104 injection is also used for treating pain related to endometriosis. This medicine may be used for other purposes; ask your health care provider or pharmacist if you have questions. COMMON BRAND NAME(S): Depo-Provera, Depo-subQ Provera 104 What should I tell my health care provider before I take this medicine? They need to know if you have any of these conditions:  asthma  blood clots  breast cancer or family history of breast cancer  depression  diabetes  eating disorder (anorexia nervosa)  heart attack  high blood pressure  HIV infection or AIDS  if you often drink alcohol  kidney disease  liver disease  migraine headaches  osteoporosis, weak  bones  seizures  stroke  tobacco smoker  vaginal bleeding  an unusual or allergic reaction to medroxyprogesterone, other hormones, medicines, foods, dyes, or preservatives  pregnant or trying to get pregnant  breast-feeding How should I use this medicine? Depo-Provera CI contraceptive injection is given into a muscle. Depo-subQ Provera 104 injection is given under the skin. It is given by a health care provider in a hospital or clinic setting. The injection is usually given during the first 5 days after the start of a menstrual period or 6 weeks after delivery of a baby. A patient package insert for the product will be given with each prescription and refill. Be sure to read this information carefully each time. The sheet may change often. Talk to your pediatrician regarding the use of this medicine in children. Special care may be needed. These injections have been used in female children who have started having menstrual periods. Overdosage: If you think you have taken too much of this medicine contact a poison control center or emergency room at once. NOTE: This medicine is only for you. Do not share this medicine with others. What if I miss a dose? Keep  appointments for follow-up doses. You must get an injection once every 3 months. It is important not to miss your dose. Call your health care provider if you are unable to keep an appointment. What may interact with this medicine?  antibiotics or medicines for infections, especially rifampin and griseofulvin  antivirals for HIV or hepatitis  aprepitant  armodafinil  bexarotene  bosentan  medicines for seizures like carbamazepine, felbamate, oxcarbazepine, phenytoin, phenobarbital, primidone, topiramate  mitotane  modafinil  St. John's wort This list may not describe all possible interactions. Give your health care provider a list of all the medicines, herbs, non-prescription drugs, or dietary supplements you use. Also  tell them if you smoke, drink alcohol, or use illegal drugs. Some items may interact with your medicine. What should I watch for while using this medicine? This drug does not protect you against HIV infection (AIDS) or other sexually transmitted diseases. Use of this product may cause you to lose calcium from your bones. Loss of calcium may cause weak bones (osteoporosis). Only use this product for more than 2 years if other forms of birth control are not right for you. The longer you use this product for birth control the more likely you will be at risk for weak bones. Ask your health care professional how you can keep strong bones. You may have a change in bleeding pattern or irregular periods. Many females stop having periods while taking this drug. If you have received your injections on time, your chance of being pregnant is very low. If you think you may be pregnant, see your health care professional as soon as possible. Tell your health care professional if you want to get pregnant within the next year. The effect of this medicine may last a long time after you get your last injection. What side effects may I notice from receiving this medicine? Side effects that you should report to your doctor or health care professional as soon as possible:  allergic reactions like skin rash, itching or hives, swelling of the face, lips, or tongue  blood clot (chest pain; shortness of breath; pain, swelling, or warmth in the leg)  breast tenderness or discharge  changes in emotions or moods  changes in vision  liver injury (dark yellow or brown urine; general ill feeling or flu-like symptoms; loss of appetite, right upper belly pain; unusually weak or tired, yellowing of the eyes or skin)  persistent pain, pus, or bleeding at the injection site  stroke (changes in vision; confusion; trouble speaking or understanding; severe headaches; sudden numbness or weakness of the face, arm or leg; trouble  walking; dizziness; loss of balance or coordination)  trouble breathing Side effects that usually do not require medical attention (report to your doctor or health care professional if they continue or are bothersome):  change in sex drive  dizziness  fluid retention  headache  irregular periods, spotting, or absent periods  pain, redness, or irritation at site where injected  stomach pain  weight gain This list may not describe all possible side effects. Call your doctor for medical advice about side effects. You may report side effects to FDA at 1-800-FDA-1088. Where should I keep my medicine? This injection is only given by a health care provider. It will not be stored at home. NOTE: This sheet is a summary. It may not cover all possible information. If you have questions about this medicine, talk to your doctor, pharmacist, or health care provider.  2021 Elsevier/Gold Standard (2019-07-29 10:29:21)

## 2020-08-18 NOTE — Progress Notes (Signed)
GYN ENCOUNTER NOTE  Subjective:       Latasha Alexander is a 30 y.o. G1P0 female is here for gynecologic evaluation of the following issues:  1. Breakthrough bleeding since 06/19/2021 after receiving second dose of Depo-Provera; symptoms worse after intercourse.   First dose of depo given immediately after IAB.   Reports intermittent lower abdominal pressure, fatigue, and headaches.   Declines STI testing, completed by PCP.   Denies difficulty breathing or respiratory distress, chest pain, abdominal pain, excessive vaginal bleeding, and leg pain.    Gynecologic History  Patient's last menstrual period was 03/30/2020 (approximate).  Contraception: Depo-Provera injections  Last Pap: 2017. Results were: normal  Obstetric History  OB History  Gravida Para Term Preterm AB Living  1         0  SAB IAB Ectopic Multiple Live Births               # Outcome Date GA Lbr Len/2nd Weight Sex Delivery Anes PTL Lv  1 Gravida             Past Medical History:  Diagnosis Date  . Anxiety   . Depression   . Diabetes mellitus without complication (HCC)   . Heartburn   . Hypercholesterolemia   . OCD (obsessive compulsive disorder)   . Panic attacks   . PONV (postoperative nausea and vomiting)    after cesarean    Past Surgical History:  Procedure Laterality Date  . CESAREAN SECTION      Current Outpatient Medications on File Prior to Visit  Medication Sig Dispense Refill  . ARIPiprazole (ABILIFY) 10 MG tablet Take 10 mg by mouth at bedtime.     . Magnesium 500 MG TABS Take 500 mg by mouth daily as needed (gallstones).     . medroxyPROGESTERone (DEPO-PROVERA) 150 MG/ML injection     . sertraline (ZOLOFT) 100 MG tablet Take 200 mg by mouth at bedtime.     . [DISCONTINUED] fluticasone (FLONASE) 50 MCG/ACT nasal spray Place 2 sprays into both nostrils daily. (Patient not taking: No sig reported) 16 g 0  . [DISCONTINUED] LATUDA 40 MG TABS tablet TAKE 1 TABLET BY MOUTH EVERY DAY WITH  SUPPER (Patient not taking: No sig reported) 30 tablet 0  . [DISCONTINUED] promethazine (PHENERGAN) 12.5 MG tablet Take 1 tablet (12.5 mg total) by mouth every 6 (six) hours as needed for nausea or vomiting. (Patient not taking: Reported on 04/29/2019) 30 tablet 2   No current facility-administered medications on file prior to visit.    Allergies  Allergen Reactions  . Cashew Nut Oil Swelling    Social History   Socioeconomic History  . Marital status: Single    Spouse name: Not on file  . Number of children: Not on file  . Years of education: Not on file  . Highest education level: Not on file  Occupational History  . Not on file  Tobacco Use  . Smoking status: Current Every Day Smoker    Packs/day: 0.25    Types: Cigarettes  . Smokeless tobacco: Never Used  Vaping Use  . Vaping Use: Some days  . Substances: Nicotine, Flavoring  Substance and Sexual Activity  . Alcohol use: No    Alcohol/week: 0.0 standard drinks  . Drug use: No  . Sexual activity: Yes    Partners: Male    Birth control/protection: Depo  Other Topics Concern  . Not on file  Social History Narrative  . Not on file   Social  Determinants of Health   Financial Resource Strain: Not on file  Food Insecurity: Not on file  Transportation Needs: Not on file  Physical Activity: Not on file  Stress: Not on file  Social Connections: Not on file  Intimate Partner Violence: Not on file    Family History  Problem Relation Age of Onset  . Diabetes Mother   . Hypertension Mother   . Hypertension Sister   . Stroke Maternal Grandfather     The following portions of the patient's history were reviewed and updated as appropriate: allergies, current medications, past family history, past medical history, past social history, past surgical history and problem list.  Review of Systems  ROS negative except as noted above. Information obtained from patient.   Objective:   BP 126/80   Pulse 83   Resp 16    Ht 5\' 4"  (1.626 m)   Wt (!) 332 lb 1.6 oz (150.6 kg)   LMP 03/30/2020 (Approximate)   BMI 57.00 kg/m    CONSTITUTIONAL: Well-developed, well-nourished female in no acute distress.   PELVIC:  External Genitalia: Normal  Vagina: Normal except for dark brown discharge  Cervix: Normal, swab collected  MUSCULOSKELETAL: Normal range of motion. No tenderness.    Assessment:   1. Breakthrough bleeding on depo provera  - CBC - FSH/LH - Thyroid Panel With TSH - Estradiol - Progesterone - Cervicovaginal ancillary only  2. PCB (post coital bleeding)  - Cervicovaginal ancillary only   Plan:   Vaginal swab collected, see orders.   Labs today, see orders.   Discussed possible reasons for breakthrough bleeding and options for management of symptoms; verbalized understanding.   Reviewed red flag symptoms and when to call.   RTC for ANNUAL EXAM and PAP or sooner if needed.    05/30/2020, CNM [facility], [facility] 08/18/20 4:57 PM

## 2020-08-19 LAB — FSH/LH
FSH: 5.4 m[IU]/mL
LH: 4 m[IU]/mL

## 2020-08-19 LAB — PROGESTERONE: Progesterone: 0.1 ng/mL

## 2020-08-19 LAB — THYROID PANEL WITH TSH
Free Thyroxine Index: 1.3 (ref 1.2–4.9)
T3 Uptake Ratio: 23 % — ABNORMAL LOW (ref 24–39)
T4, Total: 5.6 ug/dL (ref 4.5–12.0)
TSH: 2.16 u[IU]/mL (ref 0.450–4.500)

## 2020-08-19 LAB — CBC
Hematocrit: 40.9 % (ref 34.0–46.6)
Hemoglobin: 13.4 g/dL (ref 11.1–15.9)
MCH: 30 pg (ref 26.6–33.0)
MCHC: 32.8 g/dL (ref 31.5–35.7)
MCV: 92 fL (ref 79–97)
Platelets: 333 10*3/uL (ref 150–450)
RBC: 4.47 x10E6/uL (ref 3.77–5.28)
RDW: 13.3 % (ref 11.7–15.4)
WBC: 9.3 10*3/uL (ref 3.4–10.8)

## 2020-08-19 LAB — ESTRADIOL: Estradiol: 18.2 pg/mL

## 2020-08-22 LAB — CERVICOVAGINAL ANCILLARY ONLY
Bacterial Vaginitis (gardnerella): NEGATIVE
Candida Glabrata: NEGATIVE
Candida Vaginitis: POSITIVE — AB
Comment: NEGATIVE
Comment: NEGATIVE
Comment: NEGATIVE
Comment: NEGATIVE
Trichomonas: NEGATIVE

## 2020-08-23 ENCOUNTER — Other Ambulatory Visit: Payer: Self-pay

## 2020-08-23 MED ORDER — FLUCONAZOLE 150 MG PO TABS
150.0000 mg | ORAL_TABLET | Freq: Every day | ORAL | 1 refills | Status: DC
Start: 2020-08-23 — End: 2020-09-12

## 2020-09-02 ENCOUNTER — Other Ambulatory Visit: Payer: Self-pay

## 2020-09-02 ENCOUNTER — Ambulatory Visit
Admission: RE | Admit: 2020-09-02 | Discharge: 2020-09-02 | Disposition: A | Discharge status: Home / Self Care | Payer: Medicaid Other | Source: Ambulatory Visit | Attending: Family Medicine | Admitting: Family Medicine

## 2020-09-02 VITALS — BP 136/89 | HR 73 | Temp 98.0°F | Resp 14 | Ht 64.0 in | Wt 328.0 lb

## 2020-09-02 DIAGNOSIS — M545 Low back pain, unspecified: Secondary | ICD-10-CM

## 2020-09-02 DIAGNOSIS — R109 Unspecified abdominal pain: Secondary | ICD-10-CM

## 2020-09-02 LAB — URINALYSIS, COMPLETE (UACMP) WITH MICROSCOPIC
Bilirubin Urine: NEGATIVE
Glucose, UA: NEGATIVE mg/dL
Hgb urine dipstick: NEGATIVE
Ketones, ur: NEGATIVE mg/dL
Leukocytes,Ua: NEGATIVE
Nitrite: NEGATIVE
Protein, ur: NEGATIVE mg/dL
RBC / HPF: NONE SEEN RBC/hpf (ref 0–5)
Specific Gravity, Urine: 1.025 (ref 1.005–1.030)
pH: 6 (ref 5.0–8.0)

## 2020-09-02 MED ORDER — METHOCARBAMOL 500 MG PO TABS
500.0000 mg | ORAL_TABLET | Freq: Two times a day (BID) | ORAL | 0 refills | Status: DC
Start: 2020-09-02 — End: 2020-09-12

## 2020-09-02 MED ORDER — IBUPROFEN 600 MG PO TABS
600.0000 mg | ORAL_TABLET | Freq: Four times a day (QID) | ORAL | 0 refills | Status: DC | PRN
Start: 2020-09-02 — End: 2020-09-12

## 2020-09-02 NOTE — Discharge Instructions (Addendum)
Take the ibuprofen 600 mg every 6 hours on a schedule for the next 48 hours and then as needed.  Take the methocarbamol on the same every 6 hour schedule as the ibuprofen for the next 48 hours and then as needed.  The ibuprofen will help with inflammation and the methocarbamol will help with spasm.  Continue to increase your water intake to help your body flush the lactic acid from your muscles.  Follow the back exercises handout given in your discharge paperwork as these can be very helpful to help alleviate the spasm and relieve your pain.  Apply moist heat to your low back for 20 minutes at a time 2-3 times a day to improve blood flow and aid in pain relief.  Consider consulting a chiropractor about a possible pelvic or back adjustment.  You may also want to consider massage therapy as this can be very helpful in helping to alleviate spasm, promote muscle healing, and facilitate the removal of lactic acid which can be contributing to the muscle spasm and pain.

## 2020-09-02 NOTE — ED Provider Notes (Signed)
[facility]    CSN: [id number] Arrival date & time: 09/02/20  1032      History   Chief Complaint Chief Complaint  Patient presents with  . Appointment  . Flank Pain    RIGHT    HPI COOPER STAMP is a 30 y.o. female.   HPI   30 year old female here for evaluation of right flank pain.  Patient reports that she had right flank pain yesterday afternoon that has moved to her right sided low back.  Patient states that she is also had some urinary urgency but no frequency or pain with urination.  No blood in her urine, fever, nausea, or vomiting.  No history of kidney stones.  Past Medical History:  Diagnosis Date  . Anxiety   . Depression   . Diabetes mellitus without complication (HCC)   . Heartburn   . Hypercholesterolemia   . OCD (obsessive compulsive disorder)   . Panic attacks   . PONV (postoperative nausea and vomiting)    after cesarean    Patient Active Problem List   Diagnosis Date Noted  . Breakthrough bleeding on depo provera 08/18/2020  . OCD (obsessive compulsive disorder) 07/17/2016  . Severe recurrent major depression without psychotic features (HCC) 07/16/2016  . Pregnant 07/16/2016  . Anxiety and depression 07/08/2016  . Urinary symptom or sign 07/08/2016  . Morbid obesity with BMI of 45.0-49.9, adult (HCC) 06/06/2016    Past Surgical History:  Procedure Laterality Date  . CESAREAN SECTION    . none      OB History    Gravida  1   Para      Term      Preterm      AB      Living  0     SAB      IAB      Ectopic      Multiple      Live Births               Home Medications    Prior to Admission medications   Medication Sig Start Date End Date Taking? Authorizing Provider  ARIPiprazole (ABILIFY) 10 MG tablet Take 10 mg by mouth at bedtime.    Yes [provider]  ibuprofen (ADVIL) 600 MG tablet Take 1 tablet (600 mg total) by mouth every 6 (six) hours as needed. 09/02/20  Yes Becky Augusta, NP   medroxyPROGESTERone (DEPO-PROVERA) 150 MG/ML injection  06/20/20  Yes [provider]  methocarbamol (ROBAXIN) 500 MG tablet Take 1 tablet (500 mg total) by mouth 2 (two) times daily. 09/02/20  Yes Becky Augusta, NP  sertraline (ZOLOFT) 100 MG tablet Take 200 mg by mouth at bedtime.    Yes [provider]  fluconazole (DIFLUCAN) 150 MG tablet Take 1 tablet (150 mg total) by mouth daily. 08/23/20   Lawhorn, Vanessa Steele, CNM  Magnesium 500 MG TABS Take 500 mg by mouth daily as needed (gallstones).     [provider]  fluticasone (FLONASE) 50 MCG/ACT nasal spray Place 2 sprays into both nostrils daily. Patient not taking: No sig reported 08/09/16 12/03/19  Domenick Gong, MD  LATUDA 40 MG TABS tablet TAKE 1 TABLET BY MOUTH EVERY DAY WITH SUPPER Patient not taking: No sig reported 08/20/16 12/03/19  Hildred Laser, MD  promethazine (PHENERGAN) 12.5 MG tablet Take 1 tablet (12.5 mg total) by mouth every 6 (six) hours as needed for nausea or vomiting. Patient not taking: Reported on 04/29/2019 07/05/16 12/03/19  Hildred Laser, MD    Family History Family History  Problem Relation Age of Onset  . Diabetes Mother   . Hypertension Mother   . Hypertension Sister   . Stroke Maternal Grandfather     Social History Social History   Tobacco Use  . Smoking status: Former Smoker    Packs/day: 0.25    Types: Cigarettes  . Smokeless tobacco: Never Used  Vaping Use  . Vaping Use: Some days  . Substances: Nicotine, Flavoring  Substance Use Topics  . Alcohol use: No    Alcohol/week: 0.0 standard drinks  . Drug use: No     Allergies   Cashew nut oil   Review of Systems Review of Systems  Constitutional: Negative for fatigue and fever.  Gastrointestinal: Negative for diarrhea, nausea and vomiting.  Genitourinary: Positive for flank pain and urgency. Negative for dysuria, frequency and hematuria.  Musculoskeletal: Positive for back pain.  Hematological: Negative.    Psychiatric/Behavioral: Negative.      Physical Exam Triage Vital Signs ED Triage Vitals  Enc Vitals Group     BP 09/02/20 1044 136/89     Pulse Rate 09/02/20 1044 73     Resp 09/02/20 1044 14     Temp 09/02/20 1044 98 F (36.7 C)     Temp Source 09/02/20 1044 Oral     SpO2 09/02/20 1044 100 %     Weight 09/02/20 1041 (!) 328 lb (148.8 kg)     Height 09/02/20 1041 5\' 4"  (1.626 m)     Head Circumference --      Peak Flow --      Pain Score 09/02/20 1040 1     Pain Loc --      Pain Edu? --      Excl. in GC? --    No data found.  Updated Vital Signs BP 136/89 (BP Location: Left Arm)   Pulse 73   Temp 98 F (36.7 C) (Oral)   Resp 14   Ht 5\' 4"  (1.626 m)   Wt (!) 328 lb (148.8 kg)   SpO2 100%   BMI 56.30 kg/m   Visual Acuity Right Eye Distance:   Left Eye Distance:   Bilateral Distance:    Right Eye Near:   Left Eye Near:    Bilateral Near:     Physical Exam Vitals reviewed.  Constitutional:      General: She is not in acute distress.    Appearance: Normal appearance. She is obese. She is not ill-appearing.  HENT:     Head: Normocephalic and atraumatic.  Cardiovascular:     Rate and Rhythm: Normal rate and regular rhythm.     Pulses: Normal pulses.     Heart sounds: Normal heart sounds. No murmur heard. No gallop.   Pulmonary:     Effort: Pulmonary effort is normal.     Breath sounds: Normal breath sounds. No wheezing, rhonchi or rales.  Abdominal:     Palpations: Abdomen is soft.  Musculoskeletal:        General: Tenderness present. No swelling or deformity.  Skin:    General: Skin is warm and dry.     Capillary Refill: Capillary refill takes less than 2 seconds.  Neurological:     General: No focal deficit present.     Mental Status: She is alert and oriented to person, place, and time.  Psychiatric:        Mood and Affect: Mood normal.  Behavior: Behavior normal.        Thought Content: Thought content normal.        Judgment: Judgment  normal.      UC Treatments / Results  Labs (all labs ordered are listed, but only abnormal results are displayed) Labs Reviewed  URINALYSIS, COMPLETE (UACMP) WITH MICROSCOPIC - Abnormal; Notable for the following components:      Result Value   Bacteria, UA FEW (*)    All other components within normal limits  URINE CULTURE    EKG   Radiology No results found.  Procedures Procedures (including critical care time)  Medications Ordered in UC Medications - No data to display  Initial Impression / Assessment and Plan / UC Course  I have reviewed the triage vital signs and the nursing notes.  Pertinent labs & imaging results that were available during my care of the patient were reviewed by me and considered in my medical decision making (see chart for details).   Patient is a very pleasant 30 year old female here for evaluation of right flank pain that has since moved around to her right lower back.  She has had some urinary urgency but denies frequency or pain with urination.  Patient is in no acute distress on exam.  Physical exam reveals heart sounds that are S1-S2 and crisp, lung sounds that are clear to auscultation all fields.  Patient has no CVA tenderness.  Patient's abdomen is protuberant, soft, and nontender in all quadrants.  Patient does have tenderness and mild spasm in the left lumbar paraspinous region.  Urine collected at triage is unremarkable save for few bacteria.  Will send urine culture as patient says she voided right before she gave us a sample.  Suspect patient's back pain is more musculoskeletal in nature and will treat with ibuprofen and methocarbamol along with back exercises and moist heat.  If culture grows out any bacteria will treat at that time.   Final Clinical Impressions(s) / UC Diagnoses   Final diagnoses:  Acute right-sided low back pain without sciatica     Discharge Instructions     Take the ibuprofen 600 mg every 6 hours on a schedule  for the next 48 hours and then as needed.  Take the methocarbamol on the same every 6 hour schedule as the ibuprofen for the next 48 hours and then as needed.  The ibuprofen will help with inflammation and the methocarbamol will help with spasm.  Continue to increase your water intake to help your body flush the lactic acid from your muscles.  Follow the back exercises handout given in your discharge paperwork as these can be very helpful to help alleviate the spasm and relieve your pain.  Apply moist heat to your low back for 20 minutes at a time 2-3 times a day to improve blood flow and aid in pain relief.  Consider consulting a chiropractor about a possible pelvic or back adjustment.  You may also want to consider massage therapy as this can be very helpful in helping to alleviate spasm, promote muscle healing, and facilitate the removal of lactic acid which can be contributing to the muscle spasm and pain.     ED Prescriptions    Medication Sig Dispense Auth. Provider   ibuprofen (ADVIL) 600 MG tablet Take 1 tablet (600 mg total) by mouth every 6 (six) hours as needed. 30 tablet Becky Augustayan, Stormie Ventola, NP   methocarbamol (ROBAXIN) 500 MG tablet Take 1 tablet (500 mg total) by mouth 2 (two)  times daily. 20 tablet Becky Augusta, NP     PDMP not reviewed this encounter.   Becky Augusta, NP 09/02/20 1137

## 2020-09-02 NOTE — ED Triage Notes (Signed)
Patient c/o right sided flank pain that started yesterday.  Patient reports urinary frequency and urgency that started this morning.  Patient denies N/V/D.  Patient denies blood in her urine.

## 2020-09-04 LAB — URINE CULTURE: Special Requests: NORMAL

## 2020-09-12 ENCOUNTER — Other Ambulatory Visit: Payer: Self-pay

## 2020-09-12 ENCOUNTER — Ambulatory Visit
Admission: RE | Admit: 2020-09-12 | Discharge: 2020-09-12 | Disposition: A | Discharge status: Home / Self Care | Payer: Medicaid Other | Source: Ambulatory Visit | Attending: Family Medicine | Admitting: Family Medicine

## 2020-09-12 ENCOUNTER — Emergency Department
Admission: EM | Admit: 2020-09-12 | Discharge: 2020-09-12 | Discharge status: Against Medical Advice | Payer: Medicaid Other | Attending: Emergency Medicine | Admitting: Emergency Medicine

## 2020-09-12 VITALS — BP 140/84 | HR 76 | Temp 98.3°F | Resp 17 | Ht 64.0 in | Wt 320.0 lb

## 2020-09-12 DIAGNOSIS — M545 Low back pain, unspecified: Secondary | ICD-10-CM | POA: Diagnosis not present

## 2020-09-12 DIAGNOSIS — Z87891 Personal history of nicotine dependence: Secondary | ICD-10-CM | POA: Insufficient documentation

## 2020-09-12 DIAGNOSIS — R1031 Right lower quadrant pain: Secondary | ICD-10-CM | POA: Diagnosis not present

## 2020-09-12 DIAGNOSIS — Z3202 Encounter for pregnancy test, result negative: Secondary | ICD-10-CM | POA: Diagnosis not present

## 2020-09-12 DIAGNOSIS — E119 Type 2 diabetes mellitus without complications: Secondary | ICD-10-CM | POA: Insufficient documentation

## 2020-09-12 DIAGNOSIS — R110 Nausea: Secondary | ICD-10-CM | POA: Insufficient documentation

## 2020-09-12 LAB — URINALYSIS, COMPLETE (UACMP) WITH MICROSCOPIC
Bacteria, UA: NONE SEEN
Bilirubin Urine: NEGATIVE
Glucose, UA: NEGATIVE mg/dL
Hgb urine dipstick: NEGATIVE
Ketones, ur: NEGATIVE mg/dL
Leukocytes,Ua: NEGATIVE
Nitrite: NEGATIVE
Protein, ur: NEGATIVE mg/dL
Specific Gravity, Urine: 1.026 (ref 1.005–1.030)
pH: 5 (ref 5.0–8.0)

## 2020-09-12 LAB — CBC WITH DIFFERENTIAL/PLATELET
Abs Immature Granulocytes: 0.04 10*3/uL (ref 0.00–0.07)
Basophils Absolute: 0.1 10*3/uL (ref 0.0–0.1)
Basophils Relative: 1 %
Eosinophils Absolute: 0.1 10*3/uL (ref 0.0–0.5)
Eosinophils Relative: 1 %
HCT: 42.3 % (ref 36.0–46.0)
Hemoglobin: 13.7 g/dL (ref 12.0–15.0)
Immature Granulocytes: 0 %
Lymphocytes Relative: 36 %
Lymphs Abs: 4.3 10*3/uL — ABNORMAL HIGH (ref 0.7–4.0)
MCH: 30.5 pg (ref 26.0–34.0)
MCHC: 32.4 g/dL (ref 30.0–36.0)
MCV: 94.2 fL (ref 80.0–100.0)
Monocytes Absolute: 0.8 10*3/uL (ref 0.1–1.0)
Monocytes Relative: 7 %
Neutro Abs: 6.6 10*3/uL (ref 1.7–7.7)
Neutrophils Relative %: 55 %
Platelets: 356 10*3/uL (ref 150–400)
RBC: 4.49 MIL/uL (ref 3.87–5.11)
RDW: 13.2 % (ref 11.5–15.5)
Smear Review: NORMAL
WBC: 12.2 10*3/uL — ABNORMAL HIGH (ref 4.0–10.5)
nRBC: 0 % (ref 0.0–0.2)

## 2020-09-12 LAB — COMPREHENSIVE METABOLIC PANEL
ALT: 20 U/L (ref 0–44)
AST: 23 U/L (ref 15–41)
Albumin: 4 g/dL (ref 3.5–5.0)
Alkaline Phosphatase: 85 U/L (ref 38–126)
Anion gap: 9 (ref 5–15)
BUN: 13 mg/dL (ref 6–20)
CO2: 23 mmol/L (ref 22–32)
Calcium: 9.2 mg/dL (ref 8.9–10.3)
Chloride: 106 mmol/L (ref 98–111)
Creatinine, Ser: 0.67 mg/dL (ref 0.44–1.00)
GFR, Estimated: >60 mL/min (ref >60)
Glucose, Bld: 114 mg/dL — ABNORMAL HIGH (ref 70–99)
Potassium: 4.1 mmol/L (ref 3.5–5.1)
Sodium: 138 mmol/L (ref 135–145)
Total Bilirubin: 0.5 mg/dL (ref 0.3–1.2)
Total Protein: 7.9 g/dL (ref 6.5–8.1)

## 2020-09-12 LAB — POC URINE PREG, ED: Preg Test, Ur: NEGATIVE

## 2020-09-12 LAB — HCG, QUANTITATIVE, PREGNANCY: hCG, Beta Chain, Quant, S: 1 m[IU]/mL (ref <5)

## 2020-09-12 LAB — PREGNANCY, URINE: Preg Test, Ur: NEGATIVE

## 2020-09-12 MED ORDER — ACETAMINOPHEN 325 MG PO TABS
650.0000 mg | ORAL_TABLET | Freq: Once | ORAL | Status: AC
  Administered 2020-09-12: 650 mg via ORAL
  Filled 2020-09-12: qty 2

## 2020-09-12 MED ORDER — METHOCARBAMOL 750 MG PO TABS: 750.0000 mg | ORAL_TABLET | Freq: Four times a day (QID) | ORAL | 0 refills | Status: AC | PRN

## 2020-09-12 MED ORDER — KETOROLAC TROMETHAMINE 10 MG PO TABS
10.0000 mg | ORAL_TABLET | Freq: Four times a day (QID) | ORAL | 0 refills | Status: AC | PRN
Start: 2020-09-12 — End: 2020-09-17

## 2020-09-12 MED ORDER — KETOROLAC TROMETHAMINE 30 MG/ML IJ SOLN
30.0000 mg | Freq: Once | INTRAMUSCULAR | Status: AC
  Administered 2020-09-12: 30 mg via INTRAMUSCULAR
  Filled 2020-09-12: qty 1

## 2020-09-12 MED ORDER — ONDANSETRON HCL 4 MG/2ML IJ SOLN
4.0000 mg | Freq: Once | INTRAMUSCULAR | Status: AC
  Administered 2020-09-12: 4 mg via INTRAVENOUS
  Filled 2020-09-12: qty 2

## 2020-09-12 NOTE — ED Triage Notes (Signed)
Patient complains of flank pain that has moved in to RLQ states that she has been taking muscle relaxers as well since her visit here 10 days ago. States that she has taken 2 home pregnancy tests and both were positive (faint). Patient states that she is concerned for possible ectopic pregnancy.

## 2020-09-12 NOTE — ED Triage Notes (Signed)
Pt c/o right lower back pain for the past week, was seen at urgent care and dx with muscle spasm, given steroids and muscle relaxer with no relief, pt was referred to the ED for further eval

## 2020-09-12 NOTE — ED Notes (Signed)
Patient is being discharged from the Urgent Care and sent to the Emergency Department via private vehicle . Per Becky Augusta, NP, patient is in need of higher level of care due to possible appendicitis. Patient is aware and verbalizes understanding of plan of care.  Vitals:   09/12/20 1603  BP: 140/84  Pulse: 76  Resp: 17  Temp: 98.3 F (36.8 C)  SpO2: 100%

## 2020-09-12 NOTE — ED Provider Notes (Signed)
[facility]    CSN: [id number] Arrival date & time: 09/12/20  1532      History   Chief Complaint Chief Complaint  Patient presents with  . Appointment  . Possible Pregnancy    HPI Latasha Alexander is a 30 y.o. female.   HPI   30 year old female here for evaluation of right lower quadrant pain.  Patient was evaluated in this emergency department 10 days ago for right flank pain that during the course of her day of presentation had moved from her right flank to her right low back.  Patient did not have any urinary complaints at that time and her urinalysis was unremarkable.  Urine culture was sent at that time which showed multiple species and was suggestive of contamination.  Today patient reports that her flank pain has moved to her right lower quadrant.  Patient ports that she has been taking her anti-inflammatories and muscle relaxers for the last 10 days without any relief.  She states that she took 2 pregnancy tests at home that she reports were both faintly positive and she is concerned she has an ectopic pregnancy.  Patient also reports that she has had nausea, vomiting, and a decreased appetite.  Patient denies fever, painful urination, urinary urgency or frequency, blood in her urine, diarrhea, or constipation.  Past Medical History:  Diagnosis Date  . Anxiety   . Depression   . Diabetes mellitus without complication (HCC)   . Heartburn   . Hypercholesterolemia   . OCD (obsessive compulsive disorder)   . Panic attacks   . PONV (postoperative nausea and vomiting)    after cesarean    Patient Active Problem List   Diagnosis Date Noted  . Breakthrough bleeding on depo provera 08/18/2020  . OCD (obsessive compulsive disorder) 07/17/2016  . Severe recurrent major depression without psychotic features (HCC) 07/16/2016  . Pregnant 07/16/2016  . Anxiety and depression 07/08/2016  . Urinary symptom or sign 07/08/2016  . Morbid obesity with BMI of  45.0-49.9, adult (HCC) 06/06/2016    Past Surgical History:  Procedure Laterality Date  . CESAREAN SECTION    . none      OB History    Gravida  1   Para      Term      Preterm      AB      Living  0     SAB      IAB      Ectopic      Multiple      Live Births               Home Medications    Prior to Admission medications   Medication Sig Start Date End Date Taking? Authorizing Provider  ARIPiprazole (ABILIFY) 10 MG tablet Take 10 mg by mouth at bedtime.    Yes [provider]  Magnesium 500 MG TABS Take 500 mg by mouth daily as needed (gallstones).    Yes [provider]  methocarbamol (ROBAXIN) 500 MG tablet Take 1 tablet (500 mg total) by mouth 2 (two) times daily. 09/02/20  Yes Becky Augusta, NP  sertraline (ZOLOFT) 100 MG tablet Take 200 mg by mouth at bedtime.    Yes [provider]  medroxyPROGESTERone (DEPO-PROVERA) 150 MG/ML injection  06/20/20 09/12/20 Yes [provider]  fluticasone (FLONASE) 50 MCG/ACT nasal spray Place 2 sprays into both nostrils daily. Patient not taking: No sig reported 08/09/16 12/03/19  Domenick Gong, MD  LATUDA  40 MG TABS tablet TAKE 1 TABLET BY MOUTH EVERY DAY WITH SUPPER Patient not taking: No sig reported 08/20/16 12/03/19  Hildred Laser, MD  promethazine (PHENERGAN) 12.5 MG tablet Take 1 tablet (12.5 mg total) by mouth every 6 (six) hours as needed for nausea or vomiting. Patient not taking: Reported on 04/29/2019 07/05/16 12/03/19  Hildred Laser, MD    Family History Family History  Problem Relation Age of Onset  . Diabetes Mother   . Hypertension Mother   . Hypertension Sister   . Stroke Maternal Grandfather     Social History Social History   Tobacco Use  . Smoking status: Former Smoker    Packs/day: 0.25    Types: Cigarettes  . Smokeless tobacco: Never Used  Vaping Use  . Vaping Use: Some days  . Substances: Nicotine, Flavoring  Substance Use Topics  . Alcohol use:  No    Alcohol/week: 0.0 standard drinks  . Drug use: No     Allergies   Cashew nut oil   Review of Systems Review of Systems  Constitutional: Positive for appetite change. Negative for activity change and fever.  Gastrointestinal: Positive for abdominal pain, nausea and vomiting. Negative for constipation and diarrhea.  Genitourinary: Negative for dysuria, frequency, hematuria and urgency.  Musculoskeletal: Positive for back pain.  Skin: Negative for rash.  Hematological: Negative.   Psychiatric/Behavioral: Negative.      Physical Exam Triage Vital Signs ED Triage Vitals  Enc Vitals Group     BP 09/12/20 1603 140/84     Pulse Rate 09/12/20 1603 76     Resp 09/12/20 1603 17     Temp 09/12/20 1603 98.3 F (36.8 C)     Temp Source 09/12/20 1603 Oral     SpO2 09/12/20 1603 100 %     Weight 09/12/20 1601 (!) 320 lb (145.2 kg)     Height 09/12/20 1601 5\' 4"  (1.626 m)     Head Circumference --      Peak Flow --      Pain Score 09/12/20 1600 8     Pain Loc --      Pain Edu? --      Excl. in GC? --    No data found.  Updated Vital Signs BP 140/84 (BP Location: Left Arm)   Pulse 76   Temp 98.3 F (36.8 C) (Oral)   Resp 17   Ht 5\' 4"  (1.626 m)   Wt (!) 320 lb (145.2 kg)   SpO2 100%   BMI 54.93 kg/m   Visual Acuity Right Eye Distance:   Left Eye Distance:   Bilateral Distance:    Right Eye Near:   Left Eye Near:    Bilateral Near:     Physical Exam Vitals and nursing note reviewed.  Constitutional:      General: She is not in acute distress.    Appearance: Normal appearance. She is obese. She is not ill-appearing.  HENT:     Head: Normocephalic and atraumatic.  Cardiovascular:     Rate and Rhythm: Normal rate and regular rhythm.     Pulses: Normal pulses.     Heart sounds: Normal heart sounds. No murmur heard. No gallop.   Pulmonary:     Effort: Pulmonary effort is normal.     Breath sounds: Normal breath sounds. No wheezing, rhonchi or rales.   Abdominal:     General: Bowel sounds are normal.     Palpations: Abdomen is soft.     Tenderness:  There is abdominal tenderness. There is guarding. There is no rebound.  Musculoskeletal:        General: No tenderness.  Skin:    General: Skin is warm and dry.     Capillary Refill: Capillary refill takes less than 2 seconds.     Findings: No erythema or rash.  Neurological:     General: No focal deficit present.     Mental Status: She is alert and oriented to person, place, and time.  Psychiatric:        Mood and Affect: Mood normal.        Behavior: Behavior normal.        Thought Content: Thought content normal.        Judgment: Judgment normal.      UC Treatments / Results  Labs (all labs ordered are listed, but only abnormal results are displayed) Labs Reviewed  PREGNANCY, URINE    EKG   Radiology No results found.  Procedures Procedures (including critical care time)  Medications Ordered in UC Medications - No data to display  Initial Impression / Assessment and Plan / UC Course  I have reviewed the triage vital signs and the nursing notes.  Pertinent labs & imaging results that were available during my care of the patient were reviewed by me and considered in my medical decision making (see chart for details).   Patient is a very pleasant 30 year old female here for evaluation of right lower quadrant pain.  Patient states that she has had nausea and vomiting as well as decreased appetite.  Physical exam reveals normal heart and lung sounds.  Abdomen is protuberant, soft, with positive bowel sounds in all 4 quadrants.  Patient does have periumbilical pain and right lower quadrant tenderness over McBurney's point.  Patient has guarding but no rebound.  Urine pregnancy test collected at triage was negative.  There is a concern given the right lower quadrant pain with guarding and decreased appetite that patient may be developing appendicitis.  Unable to obtain CT scan  here in the urgent care today so patient was referred to [facility]'s emergency department for evaluation for possible appendicitis.   Final Clinical Impressions(s) / UC Diagnoses   Final diagnoses:  RLQ abdominal pain     Discharge Instructions     There is a definite concern given the fact that you have pain in the right lower quadrant of your abdomen that she may be developing appendicitis.  As we discussed the only way to determine if this is the case is with a CT scan of your abdomen which we are not able to accomplish in the urgent care this evening.  Therefore please go to the emergency department at [facility] for evaluation.    ED Prescriptions    None     PDMP not reviewed this encounter.   Becky Augusta, NP 09/12/20 1726

## 2020-09-12 NOTE — Discharge Instructions (Addendum)
There is a definite concern given the fact that you have pain in the right lower quadrant of your abdomen that she may be developing appendicitis.  As we discussed the only way to determine if this is the case is with a CT scan of your abdomen which we are not able to accomplish in the urgent care this evening.  Therefore please go to the emergency department at [facility] for evaluation.

## 2020-09-12 NOTE — ED Provider Notes (Signed)
[facility] Emergency Department Provider Note  ____________________________________________   Event Date/Time   First MD Initiated Contact with Patient 09/12/20 1939     (approximate)  I have reviewed the triage vital signs and the nursing notes.   HISTORY  Chief Complaint Back Pain  HPI Latasha Alexander is a 30 y.o. female reports to the emergency department for evaluation of right-sided low back pain that is radiating towards the right groin and down the leg.  Patient states that pain has been there for the last approximately 7 to 10 days.  She was seen at urgent care and diagnosed with musculoskeletal pain at that time, treated with anti-inflammatories and muscle relaxant to which she has not improved.  She returned to urgent care today given no improvement in symptoms despite taking these medications, and reported that she had a home positive pregnancy test and was concerned about ectopic pregnancy.  She reports that she does have unprotected sex, however she is on the Depo shot, is due to receive her next dose of that this week.  She does report episode of emesis approximately 4 days ago, significant amount of nausea today without emesis.  At this time, she is denying any abdominal pain, dysuria, hematuria, vaginal discharge, loss of bowel or bladder control, fever or other associated symptoms.         Past Medical History:  Diagnosis Date  . Anxiety   . Depression   . Diabetes mellitus without complication (HCC)   . Heartburn   . Hypercholesterolemia   . OCD (obsessive compulsive disorder)   . Panic attacks   . PONV (postoperative nausea and vomiting)    after cesarean    Patient Active Problem List   Diagnosis Date Noted  . Breakthrough bleeding on depo provera 08/18/2020  . OCD (obsessive compulsive disorder) 07/17/2016  . Severe recurrent major depression without psychotic features (HCC) 07/16/2016  . Pregnant 07/16/2016  . Anxiety and  depression 07/08/2016  . Urinary symptom or sign 07/08/2016  . Morbid obesity with BMI of 45.0-49.9, adult (HCC) 06/06/2016    Past Surgical History:  Procedure Laterality Date  . CESAREAN SECTION    . none      Prior to Admission medications   Medication Sig Start Date End Date Taking? Authorizing Provider  ketorolac (TORADOL) 10 MG tablet Take 1 tablet (10 mg total) by mouth every 6 (six) hours as needed for up to 5 days. 09/12/20 09/17/20 Yes Lekisha Mcghee, Ruben Gottron, PA  methocarbamol (ROBAXIN-750) 750 MG tablet Take 1 tablet (750 mg total) by mouth 4 (four) times daily as needed for up to 10 days for muscle spasms. 09/12/20 09/22/20 Yes Caree Wolpert, Ruben Gottron, PA  ARIPiprazole (ABILIFY) 10 MG tablet Take 10 mg by mouth at bedtime.     [provider]  Magnesium 500 MG TABS Take 500 mg by mouth daily as needed (gallstones).     [provider]  sertraline (ZOLOFT) 100 MG tablet Take 200 mg by mouth at bedtime.     [provider]  fluticasone (FLONASE) 50 MCG/ACT nasal spray Place 2 sprays into both nostrils daily. Patient not taking: No sig reported 08/09/16 12/03/19  Domenick Gong, MD  LATUDA 40 MG TABS tablet TAKE 1 TABLET BY MOUTH EVERY DAY WITH SUPPER Patient not taking: No sig reported 08/20/16 12/03/19  Hildred Laser, MD  medroxyPROGESTERone (DEPO-PROVERA) 150 MG/ML injection  06/20/20 09/12/20  [provider]  promethazine (PHENERGAN) 12.5 MG tablet Take 1 tablet (12.5  mg total) by mouth every 6 (six) hours as needed for nausea or vomiting. Patient not taking: Reported on 04/29/2019 07/05/16 12/03/19  Hildred Laser, MD    Allergies Cashew nut oil  Family History  Problem Relation Age of Onset  . Diabetes Mother   . Hypertension Mother   . Hypertension Sister   . Stroke Maternal Grandfather     Social History Social History   Tobacco Use  . Smoking status: Former Smoker    Packs/day: 0.25    Types: Cigarettes  . Smokeless tobacco: Never Used   Vaping Use  . Vaping Use: Some days  . Substances: Nicotine, Flavoring  Substance Use Topics  . Alcohol use: No    Alcohol/week: 0.0 standard drinks  . Drug use: No    Review of Systems Constitutional: No fever/chills Eyes: No visual changes. ENT: No sore throat. Cardiovascular: Denies chest pain. Respiratory: Denies shortness of breath. Gastrointestinal: No abdominal pain.  + nausea, + vomiting.  No diarrhea.  No constipation. Genitourinary: Negative for dysuria. Musculoskeletal: + back pain. Skin: Negative for rash. Neurological: Negative for headaches, focal weakness or numbness.  ____________________________________________   PHYSICAL EXAM:  VITAL SIGNS: ED Triage Vitals  Enc Vitals Group     BP 09/12/20 1800 (!) 161/91     Pulse Rate 09/12/20 1800 82     Resp --      Temp 09/12/20 1800 98.5 F (36.9 C)     Temp Source 09/12/20 1800 Oral     SpO2 09/12/20 1800 100 %     Weight 09/12/20 1802 (!) 320 lb (145.2 kg)     Height 09/12/20 1802 5\' 4"  (1.626 m)     Head Circumference --      Peak Flow --      Pain Score 09/12/20 1802 8     Pain Loc --      Pain Edu? --      Excl. in GC? --    Constitutional: Alert and oriented. Well appearing and in no acute distress. Eyes: Conjunctivae are normal. PERRL. EOMI. Head: Atraumatic. Nose: No congestion/rhinnorhea. Mouth/Throat: Mucous membranes are moist.   Neck: No stridor.   Cardiovascular: Normal rate, regular rhythm. Grossly normal heart sounds.  Good peripheral circulation. Respiratory: Normal respiratory effort.  No retractions. Lungs CTAB. Gastrointestinal: Soft and nontender. No distention. No abdominal bruits. + Right-sided CVA tenderness, no left-sided CVA tenderness. Musculoskeletal: There is tenderness to palpate the right paraspinal musculature of the lumbar spine, no midline tenderness.  There is tenderness of the SI joint.  Negative straight leg raise.  Dorsal pedal pulse 2+ bilaterally, range of motion  of the bilateral lower extremities is full without difficulty. Neurologic:  Normal speech and language. No gross focal neurologic deficits are appreciated. No gait instability. Skin:  Skin is warm, dry and intact. No rash noted. Psychiatric: Mood and affect are normal. Speech and behavior are normal.  ____________________________________________   LABS (all labs ordered are listed, but only abnormal results are displayed)  Labs Reviewed  CBC WITH DIFFERENTIAL/PLATELET - Abnormal; Notable for the following components:      Result Value   WBC 12.2 (*)    Lymphs Abs 4.3 (*)    All other components within normal limits  COMPREHENSIVE METABOLIC PANEL - Abnormal; Notable for the following components:   Glucose, Bld 114 (*)    All other components within normal limits  URINALYSIS, COMPLETE (UACMP) WITH MICROSCOPIC - Abnormal; Notable for the following components:   Color, Urine YELLOW (*)  APPearance HAZY (*)    All other components within normal limits  HCG, QUANTITATIVE, PREGNANCY  POC URINE PREG, ED   ____________________________________________   INITIAL IMPRESSION / ASSESSMENT AND PLAN / ED COURSE  As part of my medical decision making, I reviewed the following data within the electronic MEDICAL RECORD NUMBER Nursing notes reviewed and incorporated, Labs reviewed and Notes from prior ED visits        Patient is a 30 year old female who reports to the emergency department after referral from urgent care for evaluation of significant right-sided back pain that is failed to improve with anti-inflammatories and muscle relaxant and concerned that she had a positive pregnancy test at home.  See HPI for further details.  In triage, the patient is mildly hypertensive otherwise has normal vital signs.  On physical exam, she is noted to have right-sided CVA tenderness, right-sided lumbar paraspinal tenderness, SI joint tenderness.  There is no significant straight leg raise, neurovascularly  intact of the bilateral lower extremities without difficulty.  Differentials considered include renal stone, musculoskeletal back pain, pyelonephritis, ectopic pregnancy, PID or other intra-abdominal infection.  Laboratory evaluation was initiated with CBC, CMP, beta hCG, urinalysis and POC pregnancy test.  Urinalysis is negative for any hematuria, leuks, nitrites or bacteria concerning for infection.  On CBC, white count is mildly elevated at 12.2, however patient reports that this has been noted several times at her PCP followed.  CMP grossly within normal limits.  Quantitative hCG is 1, suggested the patient is not pregnant.  Given no concern for ectopic pregnancy, as well as the patient not being tender to abdominal palpation today, increase likelihood of musculoskeletal back pain versus renal stone.  Discussed imaging with the patient with x-ray versus renal stone study.  Unfortunately, the patient is unable to stay given that she has to pick her child up.  She states that she has follow-up with her PCP tomorrow for repeat Depo shot.  Given that the patient has close follow-up, will discharge the patient with signing AMA but will prescribe Toradol as well as Robaxin at increased dose.  Patient cautioned on potential for GI bleed with Toradol mixed with her psych medications.  Strict return precautions were discussed, and patient was advised to return to the emergency department if she experiences any worsening.      ____________________________________________   FINAL CLINICAL IMPRESSION(S) / ED DIAGNOSES  Final diagnoses:  Acute right-sided low back pain, unspecified whether sciatica present     ED Discharge Orders         Ordered    ketorolac (TORADOL) 10 MG tablet  Every 6 hours PRN        09/12/20 2121    methocarbamol (ROBAXIN-750) 750 MG tablet  4 times daily PRN        09/12/20 2121          *Please note:  Latasha Alexander was evaluated in Emergency Department on 09/13/2020  for the symptoms described in the history of present illness. She was evaluated in the context of the global COVID-19 pandemic, which necessitated consideration that the patient might be at risk for infection with the SARS-CoV-2 virus that causes COVID-19. Institutional protocols and algorithms that pertain to the evaluation of patients at risk for COVID-19 are in a state of rapid change based on information released by regulatory bodies including the CDC and federal and state organizations. These policies and algorithms were followed during the patient's care in the ED.  Some ED evaluations and  interventions may be delayed as a result of limited staffing during and the pandemic.*   Note:  This document was prepared using Dragon voice recognition software and may include unintentional dictation errors.   Lucy Chris, PA 09/13/20 Glena Norfolk    Delton Prairie, MD 09/13/20 [id number]

## 2020-09-12 NOTE — Discharge Instructions (Addendum)
Please return to ER with any worsening pain, pain with urinating, fever or any other change in your symptoms.  Otherwise, follow-up with primary care as scheduled tomorrow.  In the interim, you may continue the Toradol and Robaxin as prescribed.  Please be aware of increased risk for GI bleeding, return to ER with any vomiting of blood or blood in your stools.  You may also take Tylenol, up to 1000 mg up to 4 times daily as needed in addition for pain.

## 2020-10-27 ENCOUNTER — Ambulatory Visit
Admission: RE | Admit: 2020-10-27 | Discharge: 2020-10-27 | Disposition: A | Discharge status: Home / Self Care | Payer: Medicaid Other | Source: Ambulatory Visit | Attending: Family Medicine | Admitting: Family Medicine

## 2020-10-27 ENCOUNTER — Other Ambulatory Visit: Payer: Self-pay

## 2020-10-27 VITALS — BP 141/89 | HR 86 | Temp 98.3°F | Resp 24

## 2020-10-27 DIAGNOSIS — J302 Other seasonal allergic rhinitis: Secondary | ICD-10-CM

## 2020-10-27 NOTE — ED Provider Notes (Signed)
Latasha Alexander    CSN: [id number] Arrival date & time: 10/27/20  1137      History   Chief Complaint Chief Complaint  Patient presents with  . Appointment    12:00  . URI    HPI Latasha Alexander is a 30 y.o. female.   Patient presents with 1 day history of sinus congestion, cough, runny nose, postnasal drip, and sinus pressure.  She states when she blows her nose she has yellow mucus with bright red streaks.  She denies fever, chills, rash, shortness of breath, vomiting, diarrhea, or other symptoms.  No treatments attempted at home.  Patient had a telemedicine visit on 10/11/2020; diagnosed with rhinorrhea; treated symptomatically with Claritin, Flonase, Tessalon.  She was seen by her primary care provider on 10/25/2020; diagnosed with cough, fatigue, snoring, type 2 diabetes, anxiety and depression, BMI 50-59; COVID negative.  Her medical history includes morbid obesity, anxiety, depression, obsessive compulsive disorder, panic attacks, diabetes.  The history is provided by the patient and medical records.    Past Medical History:  Diagnosis Date  . Anxiety   . Depression   . Diabetes mellitus without complication (HCC)   . Heartburn   . Hypercholesterolemia   . OCD (obsessive compulsive disorder)   . Panic attacks   . PONV (postoperative nausea and vomiting)    after cesarean    Patient Active Problem List   Diagnosis Date Noted  . Breakthrough bleeding on depo provera 08/18/2020  . OCD (obsessive compulsive disorder) 07/17/2016  . Severe recurrent major depression without psychotic features (HCC) 07/16/2016  . Pregnant 07/16/2016  . Anxiety and depression 07/08/2016  . Urinary symptom or sign 07/08/2016  . Morbid obesity with BMI of 45.0-49.9, adult (HCC) 06/06/2016    Past Surgical History:  Procedure Laterality Date  . CESAREAN SECTION    . none      OB History    Gravida  1   Para      Term      Preterm      AB      Living  0     SAB       IAB      Ectopic      Multiple      Live Births               Home Medications    Prior to Admission medications   Medication Sig Start Date End Date Taking? Authorizing Provider  ARIPiprazole (ABILIFY) 10 MG tablet Take 10 mg by mouth at bedtime.    Yes [provider]  Magnesium 500 MG TABS Take 500 mg by mouth daily as needed (gallstones).    Yes [provider]  sertraline (ZOLOFT) 100 MG tablet Take 200 mg by mouth at bedtime.    Yes [provider]  fluticasone (FLONASE) 50 MCG/ACT nasal spray Place 2 sprays into both nostrils daily. Patient not taking: No sig reported 08/09/16 12/03/19  Domenick Gong, MD  LATUDA 40 MG TABS tablet TAKE 1 TABLET BY MOUTH EVERY DAY WITH SUPPER Patient not taking: No sig reported 08/20/16 12/03/19  Hildred Laser, MD  medroxyPROGESTERone (DEPO-PROVERA) 150 MG/ML injection  06/20/20 09/12/20  [provider]  promethazine (PHENERGAN) 12.5 MG tablet Take 1 tablet (12.5 mg total) by mouth every 6 (six) hours as needed for nausea or vomiting. Patient not taking: Reported on 04/29/2019 07/05/16 12/03/19  Hildred Laser, MD    Family History Family History  Problem Relation Age  of Onset  . Diabetes Mother   . Hypertension Mother   . Hypertension Sister   . Stroke Maternal Grandfather     Social History Social History   Tobacco Use  . Smoking status: Former Smoker    Packs/day: 0.25    Types: Cigarettes  . Smokeless tobacco: Never Used  Vaping Use  . Vaping Use: Some days  . Substances: Nicotine, Flavoring  Substance Use Topics  . Alcohol use: No    Alcohol/week: 0.0 standard drinks  . Drug use: No     Allergies   Cashew nut oil   Review of Systems Review of Systems  Constitutional: Negative for chills and fever.  HENT: Positive for congestion, postnasal drip, rhinorrhea and sinus pressure. Negative for ear pain and sore throat.   Respiratory: Positive for cough. Negative for shortness of  breath.   Cardiovascular: Negative for chest pain and palpitations.  Gastrointestinal: Negative for abdominal pain, diarrhea and vomiting.  Skin: Negative for color change and rash.  All other systems reviewed and are negative.    Physical Exam Triage Vital Signs ED Triage Vitals  Enc Vitals Group     BP      Pulse      Resp      Temp      Temp src      SpO2      Weight      Height      Head Circumference      Peak Flow      Pain Score      Pain Loc      Pain Edu?      Excl. in GC?    No data found.  Updated Vital Signs BP (!) 141/89 (BP Location: Left Arm) Comment (BP Location): regular cuff to left forearm  Pulse 86   Temp 98.3 F (36.8 C) (Oral)   Resp (!) 24   SpO2 97%   Visual Acuity Right Eye Distance:   Left Eye Distance:   Bilateral Distance:    Right Eye Near:   Left Eye Near:    Bilateral Near:     Physical Exam Vitals and nursing note reviewed.  Constitutional:      General: She is not in acute distress.    Appearance: She is well-developed. She is obese. She is not ill-appearing.  HENT:     Head: Normocephalic and atraumatic.     Right Ear: Tympanic membrane normal.     Left Ear: Tympanic membrane normal.     Nose: Nose normal.     Mouth/Throat:     Mouth: Mucous membranes are moist.     Pharynx: Oropharynx is clear.  Eyes:     Conjunctiva/sclera: Conjunctivae normal.  Cardiovascular:     Rate and Rhythm: Normal rate and regular rhythm.     Heart sounds: Normal heart sounds.  Pulmonary:     Effort: Pulmonary effort is normal. No respiratory distress.     Breath sounds: Normal breath sounds.  Abdominal:     Palpations: Abdomen is soft.     Tenderness: There is no abdominal tenderness.  Musculoskeletal:     Cervical back: Neck supple.  Skin:    General: Skin is warm and dry.  Neurological:     General: No focal deficit present.     Mental Status: She is alert and oriented to person, place, and time.     Gait: Gait normal.   Psychiatric:        Mood  and Affect: Mood normal.        Behavior: Behavior normal.      UC Treatments / Results  Labs (all labs ordered are listed, but only abnormal results are displayed) Labs Reviewed - No data to display  EKG   Radiology No results found.  Procedures Procedures (including critical care time)  Medications Ordered in UC Medications - No data to display  Initial Impression / Assessment and Plan / UC Course  I have reviewed the triage vital signs and the nursing notes.  Pertinent labs & imaging results that were available during my care of the patient were reviewed by me and considered in my medical decision making (see chart for details).   Seasonal allergies.  Patient is well-appearing and her exam is reassuring.  She declines COVID test today; she states she had a negative COVID test 2 days ago.  Discussed symptomatic treatment including Zyrtec and Flonase.  Instructed her to follow-up with her PCP if her symptoms or not improving.  She agrees to plan of care.   Final Clinical Impressions(s) / UC Diagnoses   Final diagnoses:  Seasonal allergies     Discharge Instructions     Take over-the-counter Zyrtec and use over-the-counter Flonase nasal spray as directed.    Follow up with your primary care provider if your symptoms are not improving.        ED Prescriptions    None     PDMP not reviewed this encounter.   Mickie Bail, NP 10/27/20 1212

## 2020-10-27 NOTE — ED Triage Notes (Signed)
Patient complains of stuffy nose.  Pressure under eyes and forehead.  Patient states she is blowing yellow nasal congestion and it is blood streaked.  Symptoms started last night.

## 2020-10-27 NOTE — Discharge Instructions (Addendum)
Take over-the-counter Zyrtec and use over-the-counter Flonase nasal spray as directed.    Follow up with your primary care provider if your symptoms are not improving.

## 2020-12-05 ENCOUNTER — Other Ambulatory Visit: Payer: Self-pay

## 2020-12-05 ENCOUNTER — Encounter: Payer: Self-pay | Admitting: Certified Nurse Midwife

## 2020-12-05 ENCOUNTER — Ambulatory Visit (INDEPENDENT_AMBULATORY_CARE_PROVIDER_SITE_OTHER): Payer: Medicaid Other | Admitting: Certified Nurse Midwife

## 2020-12-05 VITALS — BP 132/86 | HR 74 | Resp 16 | Ht 65.0 in | Wt 335.5 lb

## 2020-12-05 DIAGNOSIS — Z30430 Encounter for insertion of intrauterine contraceptive device: Secondary | ICD-10-CM | POA: Diagnosis not present

## 2020-12-05 NOTE — Patient Instructions (Signed)
IUD PLACEMENT POST-PROCEDURE INSTRUCTIONS  You may take Ibuprofen, Aleve or Tylenol for pain if needed.  Cramping should resolve within in 24 hours.  You may have a small amount of spotting.  You should wear a mini pad for the next few days.  You may have intercourse after 72 hours.  If you using this for birth control, it is effective immediately.  You need to call if you have any pelvic pain, fever, heavy bleeding or foul smelling vaginal discharge.  Irregular bleeding is common the first several months after having an IUD placed. You do not need to call for this reason unless you are concerned.  Shower or bathe as normal  You should have a follow-up appointment in 4-8 weeks for a re-check to make sure you are not having any problems.   Intrauterine Device Insertion, Care After This sheet gives you information about how to care for yourself after your procedure. Your health care provider may also give you more specific instructions. If you have problems or questions, contact your health careprovider. What can I expect after the procedure? After the procedure, it is common to have: Cramps and pain in the abdomen. Bleeding. It may be light or heavy. This may last for a few days. Lower back pain. Dizziness. Headaches. Nausea. Follow these instructions at home:  Before resuming sexual activity, check to make sure that you can feel the IUD string or strings. You should be able to feel the end of the string below the opening of your cervix. If your IUD string is in place, you may resume sexual activity. If you had a hormonal IUD inserted more than 7 days after your most recent period started, you will need to use a backup method of birth control for 7 days after IUD insertion. Ask your health care provider whether this applies to you. Continue to check that the IUD is still in place by feeling for the strings after every menstrual period, or once a month. An IUD will not protect you from  sexually transmitted infections (STIs). Use methods to prevent the exchange of body fluids between partners (barrier protection) every time you have sex. Barrier protection can be used during oral, vaginal, or anal sex. Commonly used barrier methods include: Female condom. Female condom. Dental dam. Take over-the-counter and prescription medicines only as told by your health care provider. Keep all follow-up visits as told by your health care provider. This is important. Contact a health care provider if: You feel light-headed or weak. You have any of the following problems with your IUD string or strings: The string bothers or hurts you or your sexual partner. You cannot feel the string. The string has gotten longer. You can feel the IUD in your vagina. You think you may be pregnant, or you miss your menstrual period. You think you may have a sexually transmitted infection (STI). Get help right away if: You have flu-like symptoms, such as tiredness (fatigue) and muscle aches. You have a fever and chills. You have bleeding that is heavier or lasts longer than a normal menstrual cycle. You have abnormal or bad-smelling discharge from your vagina. You develop abdominal pain that is new, is getting worse, or is not in the same area of earlier cramping and pain. You have pain during sexual activity. Summary After the procedure, it is common to have cramps and pain in the abdomen. It is also common to have light bleeding or heavier bleeding that is like your menstrual period. Continue  to check that the IUD is still in place by feeling for the strings after every menstrual period, or once a month. Keep all follow-up visits as told by your health care provider. This is important. Contact your health care provider if you have problems with your IUD strings, such as the string getting longer or bothering you or your sexual partner. This information is not intended to replace advice given to you by  your health care provider. Make sure you discuss any questions you have with your healthcare provider. Document Revised: 06/02/2019 Document Reviewed: 06/02/2019 Elsevier Patient Education  2022 [employer].

## 2020-12-05 NOTE — Progress Notes (Signed)
Latasha Alexander is a 30 y.o. year old G1P0 Caucasian female who presents for placement of a Paragard IUD.   BP 132/86   Pulse 74   Resp 16   Ht 5\' 5"  (1.651 m)   Wt (!) 335 lb 8 oz (152.2 kg)   LMP  (LMP Unknown)   BMI 55.83 kg/m    The risks and benefits of the method and placement have been thouroughly reviewed with the patient and all questions were answered.  Specifically the patient is aware of failure rate of 06/998, expulsion of the IUD and of possible perforation.    The patient is aware of menorrhagia and dysmenorrhea associated with this method.    Signed copy of informed consent in chart.   Time out was performed.  A large plastic speculum was placed in the vagina.  The cervix was visualized, prepped using Betadine, and grasped with a single tooth tenaculum. The uterus sounded to 8 cm.  IUD placed per manufacturer's recommendations. The strings were trimmed to 3 cm.  The patient was given post procedure instructions, including signs and symptoms of infection and to check for the strings after each menses or each month, and refraining from intercourse or anything in the vagina for 3 days.  She was given a care card with date of IUD placement and date IUD to be removed.  Reviewed red flag symptoms and when to call.   RTC x 4 weeks for string check or sooner if needed.    , CNM [facility], [facility] 12/05/20 11:09 AM   NDC: 12/07/20 Exp: 09/2025  Lot: 10/2025

## 2020-12-10 ENCOUNTER — Ambulatory Visit
Admission: EM | Admit: 2020-12-10 | Discharge: 2020-12-10 | Disposition: A | Discharge status: Home / Self Care | Payer: Medicaid Other | Attending: Family Medicine | Admitting: Family Medicine

## 2020-12-10 ENCOUNTER — Other Ambulatory Visit: Payer: Self-pay

## 2020-12-10 DIAGNOSIS — Z20822 Contact with and (suspected) exposure to covid-19: Secondary | ICD-10-CM | POA: Diagnosis not present

## 2020-12-10 DIAGNOSIS — Z87891 Personal history of nicotine dependence: Secondary | ICD-10-CM | POA: Diagnosis not present

## 2020-12-10 DIAGNOSIS — J988 Other specified respiratory disorders: Secondary | ICD-10-CM

## 2020-12-10 DIAGNOSIS — R112 Nausea with vomiting, unspecified: Secondary | ICD-10-CM | POA: Insufficient documentation

## 2020-12-10 DIAGNOSIS — B9789 Other viral agents as the cause of diseases classified elsewhere: Secondary | ICD-10-CM

## 2020-12-10 DIAGNOSIS — R0981 Nasal congestion: Secondary | ICD-10-CM | POA: Diagnosis present

## 2020-12-10 DIAGNOSIS — R519 Headache, unspecified: Secondary | ICD-10-CM | POA: Insufficient documentation

## 2020-12-10 DIAGNOSIS — J029 Acute pharyngitis, unspecified: Secondary | ICD-10-CM | POA: Diagnosis not present

## 2020-12-10 MED ORDER — CETIRIZINE-PSEUDOEPHEDRINE ER 5-120 MG PO TB12: 1.0000 | ORAL_TABLET | Freq: Two times a day (BID) | ORAL | 0 refills | Status: DC

## 2020-12-10 MED ORDER — IPRATROPIUM BROMIDE 0.06 % NA SOLN: 2.0000 | Freq: Four times a day (QID) | NASAL | 0 refills | Status: DC | PRN

## 2020-12-10 NOTE — ED Triage Notes (Signed)
Pt c/o sore throat, nasal congestion and headache for about 2 days. Pt also reports having chills last night, nausea and one episode of emesis yesterday. Pt did take an at-home COVID test last night and it was negative. She would like to take another test here.

## 2020-12-10 NOTE — Discharge Instructions (Addendum)
Check your mychart account for the test result.  Use the medications as directed. They should help with the symptoms, particularly the congestion.  Take care  Dr. Adriana Simas

## 2020-12-10 NOTE — ED Provider Notes (Signed)
[facility]    CSN: [id number] Arrival date & time: 12/10/20  3235      History   Chief Complaint Chief Complaint  Patient presents with   Sore Throat   Nasal Congestion   HPI  30 year old female presents with respiratory symptoms.  2-day history of symptoms.  She reports sore throat, nasal congestion, headache.  She states that she had some nausea and 1 episode of emesis.  She took a home COVID test which was negative.  No documented fever.  She does report chills.  No known exacerbating relieving factors.  She states that her father has had chills but has no other symptoms.  She denies any other sick contacts.  No other complaints or concerns at this time.  Past Medical History:  Diagnosis Date   Anxiety    Depression    Diabetes mellitus without complication (HCC)    Heartburn    Hypercholesterolemia    OCD (obsessive compulsive disorder)    Panic attacks    PONV (postoperative nausea and vomiting)    after cesarean    Patient Active Problem List   Diagnosis Date Noted   Breakthrough bleeding on depo provera 08/18/2020   OCD (obsessive compulsive disorder) 07/17/2016   Severe recurrent major depression without psychotic features (HCC) 07/16/2016   Pregnant 07/16/2016   Anxiety and depression 07/08/2016   Urinary symptom or sign 07/08/2016   Morbid obesity with BMI of 45.0-49.9, adult (HCC) 06/06/2016    Past Surgical History:  Procedure Laterality Date   CESAREAN SECTION     none      OB History     Gravida  1   Para      Term      Preterm      AB      Living  0      SAB      IAB      Ectopic      Multiple      Live Births               Home Medications    Prior to Admission medications   Medication Sig Start Date End Date Taking? Authorizing Provider  ARIPiprazole (ABILIFY) 10 MG tablet Take 10 mg by mouth at bedtime.    Yes [provider]  cetirizine-pseudoephedrine (ZYRTEC-D) 5-120 MG tablet Take 1  tablet by mouth 2 (two) times daily. 12/10/20  Yes Jahsir Rama G, DO  ipratropium (ATROVENT) 0.06 % nasal spray Place 2 sprays into both nostrils 4 (four) times daily as needed for rhinitis. 12/10/20  Yes Cambria Osten G, DO  Magnesium 500 MG TABS Take 500 mg by mouth daily as needed (gallstones).    Yes [provider]  paragard intrauterine copper IUD IUD 1 each by Intrauterine route once.   Yes [provider]  sertraline (ZOLOFT) 100 MG tablet Take 200 mg by mouth at bedtime.    Yes [provider]  fluticasone (FLONASE) 50 MCG/ACT nasal spray Place 2 sprays into both nostrils daily. Patient not taking: No sig reported 08/09/16 12/03/19  Domenick Gong, MD  LATUDA 40 MG TABS tablet TAKE 1 TABLET BY MOUTH EVERY DAY WITH SUPPER Patient not taking: No sig reported 08/20/16 12/03/19  Hildred Laser, MD  medroxyPROGESTERone (DEPO-PROVERA) 150 MG/ML injection  06/20/20 09/12/20  [provider]  promethazine (PHENERGAN) 12.5 MG tablet Take 1 tablet (12.5 mg total) by mouth every 6 (six) hours as needed for nausea or vomiting. Patient not  taking: Reported on 04/29/2019 07/05/16 12/03/19  Hildred Laser, MD    Family History Family History  Problem Relation Age of Onset   Diabetes Mother    Hypertension Mother    Hypertension Sister    Stroke Maternal Grandfather     Social History Social History   Tobacco Use   Smoking status: Former    Packs/day: 0.25    Pack years: 0.00    Types: Cigarettes   Smokeless tobacco: Never  Vaping Use   Vaping Use: Some days   Substances: Nicotine, Flavoring  Substance Use Topics   Alcohol use: No    Alcohol/week: 0.0 standard drinks   Drug use: No     Allergies   Cashew nut oil   Review of Systems Review of Systems Per HPI  Physical Exam Triage Vital Signs ED Triage Vitals  Enc Vitals Group     BP 12/10/20 0855 (!) 138/96     Pulse Rate 12/10/20 0855 92     Resp 12/10/20 0855 18     Temp 12/10/20 0855 98.3  F (36.8 C)     Temp Source 12/10/20 0855 Oral     SpO2 12/10/20 0855 99 %     Weight 12/10/20 0852 (!) 328 lb (148.8 kg)     Height 12/10/20 0852 5\' 5"  (1.651 m)     Head Circumference --      Peak Flow --      Pain Score 12/10/20 0852 0     Pain Loc --      Pain Edu? --      Excl. in GC? --    Updated Vital Signs BP (!) 138/96 (BP Location: Left Wrist)   Pulse 92   Temp 98.3 F (36.8 C) (Oral)   Resp 18   Ht 5\' 5"  (1.651 m)   Wt (!) 148.8 kg   LMP  (LMP Unknown)   SpO2 99%   BMI 54.58 kg/m   Visual Acuity Right Eye Distance:   Left Eye Distance:   Bilateral Distance:    Right Eye Near:   Left Eye Near:    Bilateral Near:     Physical Exam Vitals and nursing note reviewed.  Constitutional:      General: She is not in acute distress.    Appearance: Normal appearance. She is obese. She is not ill-appearing.  HENT:     Head: Normocephalic and atraumatic.     Right Ear: Tympanic membrane normal.     Left Ear: Tympanic membrane normal.     Mouth/Throat:     Pharynx: No oropharyngeal exudate or posterior oropharyngeal erythema.     Comments: 2+ tonsils. Eyes:     General:        Right eye: No discharge.        Left eye: No discharge.     Conjunctiva/sclera: Conjunctivae normal.  Cardiovascular:     Rate and Rhythm: Normal rate and regular rhythm.  Pulmonary:     Effort: Pulmonary effort is normal.     Breath sounds: Normal breath sounds. No wheezing, rhonchi or rales.  Neurological:     Mental Status: She is alert.  Psychiatric:        Mood and Affect: Mood normal.        Behavior: Behavior normal.     UC Treatments / Results  Labs (all labs ordered are listed, but only abnormal results are displayed) Labs Reviewed  SARS CORONAVIRUS 2 (TAT 6-24 HRS)    EKG  Radiology No results found.  Procedures Procedures (including critical care time)  Medications Ordered in UC Medications - No data to display  Initial Impression / Assessment and Plan  / UC Course  I have reviewed the triage vital signs and the nursing notes.  Pertinent labs & imaging results that were available during my care of the patient were reviewed by me and considered in my medical decision making (see chart for details).    30 year old female presents with a viral respiratory infection. Awaiting COVID test results. Treating symptomatically with Zyrtec D and Atrovent nasal spray.    Final Clinical Impressions(s) / UC Diagnoses   Final diagnoses:  Viral respiratory infection     Discharge Instructions      Check your mychart account for the test result.  Use the medications as directed. They should help with the symptoms, particularly the congestion.  Take care  Dr. Adriana Simas    ED Prescriptions     Medication Sig Dispense Auth. Provider   cetirizine-pseudoephedrine (ZYRTEC-D) 5-120 MG tablet Take 1 tablet by mouth 2 (two) times daily. 30 tablet Tylee Yum G, DO   ipratropium (ATROVENT) 0.06 % nasal spray Place 2 sprays into both nostrils 4 (four) times daily as needed for rhinitis. 15 mL Tommie Sams, DO      PDMP not reviewed this encounter.   Tommie Sams, DO 12/10/20 1017

## 2020-12-11 LAB — SARS CORONAVIRUS 2 (TAT 6-24 HRS): SARS Coronavirus 2: NEGATIVE

## 2020-12-27 ENCOUNTER — Other Ambulatory Visit: Payer: Self-pay

## 2020-12-27 ENCOUNTER — Ambulatory Visit
Admission: RE | Admit: 2020-12-27 | Discharge: 2020-12-27 | Disposition: A | Discharge status: Home / Self Care | Payer: Medicaid Other | Source: Ambulatory Visit | Attending: Emergency Medicine | Admitting: Emergency Medicine

## 2020-12-27 VITALS — BP 143/94 | HR 80 | Temp 98.1°F | Resp 18

## 2020-12-27 DIAGNOSIS — M5441 Lumbago with sciatica, right side: Secondary | ICD-10-CM | POA: Diagnosis not present

## 2020-12-27 MED ORDER — IBUPROFEN 600 MG PO TABS: 600.0000 mg | ORAL_TABLET | Freq: Four times a day (QID) | ORAL | 0 refills | Status: DC | PRN

## 2020-12-27 MED ORDER — METHOCARBAMOL 500 MG PO TABS: 500.0000 mg | ORAL_TABLET | Freq: Two times a day (BID) | ORAL | 0 refills | Status: DC | PRN

## 2020-12-27 NOTE — Discharge Instructions (Addendum)
Take ibuprofen as needed for discomfort.  Take the muscle relaxer as needed for muscle spasm; Do not drive, operate machinery, or drink alcohol with this medication as it can cause drowsiness.   Follow up with your primary care provider or an orthopedist if your symptoms are not improving.     

## 2020-12-27 NOTE — ED Triage Notes (Signed)
Pt presents after fall last night, tripped on sons toy and hit thoracic spine area on sofa.  Pain travels down back and through R SI area into posterior thigh.No numbness/tingling. Pain exacerbated by change in position.

## 2020-12-27 NOTE — ED Provider Notes (Signed)
Latasha Alexander    CSN: [id number] Arrival date & time: 12/27/20  3419      History   Chief Complaint Chief Complaint  Patient presents with   Fall    900 appt    Back Pain    HPI Latasha Alexander is a 30 y.o. female.  Patient presents with lower back pain after trip and fall yesterday evening.  She tripped over her son's toy and fell.  She struck her lower back on the sofa.  No head injury or loss of consciousness.  She reports the pain occasionally radiates to her right posterior thigh.  She denies chest pain, shortness of breath, dizziness, numbness, weakness, saddle anesthesia, loss of bowel/bladder control, abdominal pain, dysuria, hematuria, wounds, redness, bruising, or other symptoms.  Her medical history includes diabetes, morbid obesity, anxiety, depression, obsessive-compulsive disorder, panic attacks.  The history is provided by the patient and medical records.   Past Medical History:  Diagnosis Date   Anxiety    Depression    Diabetes mellitus without complication (HCC)    Heartburn    Hypercholesterolemia    OCD (obsessive compulsive disorder)    Panic attacks    PONV (postoperative nausea and vomiting)    after cesarean    Patient Active Problem List   Diagnosis Date Noted   Breakthrough bleeding on depo provera 08/18/2020   OCD (obsessive compulsive disorder) 07/17/2016   Severe recurrent major depression without psychotic features (HCC) 07/16/2016   Pregnant 07/16/2016   Anxiety and depression 07/08/2016   Urinary symptom or sign 07/08/2016   Morbid obesity with BMI of 45.0-49.9, adult (HCC) 06/06/2016    Past Surgical History:  Procedure Laterality Date   CESAREAN SECTION     none      OB History     Gravida  1   Para      Term      Preterm      AB      Living  0      SAB      IAB      Ectopic      Multiple      Live Births               Home Medications    Prior to Admission medications   Medication Sig  Start Date End Date Taking? Authorizing Provider  ARIPiprazole (ABILIFY) 10 MG tablet Take 10 mg by mouth at bedtime.    Yes [provider]  ibuprofen (ADVIL) 600 MG tablet Take 1 tablet (600 mg total) by mouth every 6 (six) hours as needed. 12/27/20  Yes Mickie Bail, NP  methocarbamol (ROBAXIN) 500 MG tablet Take 1 tablet (500 mg total) by mouth 2 (two) times daily as needed for muscle spasms. 12/27/20  Yes Mickie Bail, NP  paragard intrauterine copper IUD IUD 1 each by Intrauterine route once.   Yes [provider]  sertraline (ZOLOFT) 100 MG tablet Take 200 mg by mouth at bedtime.    Yes [provider]  cetirizine-pseudoephedrine (ZYRTEC-D) 5-120 MG tablet Take 1 tablet by mouth 2 (two) times daily. 12/10/20   Tommie Sams, DO  ipratropium (ATROVENT) 0.06 % nasal spray Place 2 sprays into both nostrils 4 (four) times daily as needed for rhinitis. 12/10/20   Tommie Sams, DO  Magnesium 500 MG TABS Take 500 mg by mouth daily as needed (gallstones).     [provider]  fluticasone (FLONASE) 50 MCG/ACT  nasal spray Place 2 sprays into both nostrils daily. Patient not taking: No sig reported 08/09/16 12/03/19  Domenick Gong, MD  LATUDA 40 MG TABS tablet TAKE 1 TABLET BY MOUTH EVERY DAY WITH SUPPER Patient not taking: No sig reported 08/20/16 12/03/19  Hildred Laser, MD  medroxyPROGESTERone (DEPO-PROVERA) 150 MG/ML injection  06/20/20 09/12/20  [provider]  promethazine (PHENERGAN) 12.5 MG tablet Take 1 tablet (12.5 mg total) by mouth every 6 (six) hours as needed for nausea or vomiting. Patient not taking: Reported on 04/29/2019 07/05/16 12/03/19  Hildred Laser, MD    Family History Family History  Problem Relation Age of Onset   Diabetes Mother    Hypertension Mother    Hypertension Sister    Stroke Maternal Grandfather     Social History Social History   Tobacco Use   Smoking status: Former    Packs/day: 0.25    Pack years: 0.00     Types: Cigarettes   Smokeless tobacco: Never  Vaping Use   Vaping Use: Former   Substances: Nicotine, Flavoring  Substance Use Topics   Alcohol use: No    Alcohol/week: 0.0 standard drinks   Drug use: No     Allergies   Cashew nut oil   Review of Systems Review of Systems  Constitutional:  Negative for chills and fever.  Respiratory:  Negative for cough and shortness of breath.   Cardiovascular:  Negative for chest pain and palpitations.  Gastrointestinal:  Negative for abdominal pain and vomiting.  Genitourinary:  Negative for dysuria and hematuria.  Musculoskeletal:  Positive for back pain. Negative for arthralgias and gait problem.  Skin:  Negative for color change and rash.  Neurological:  Negative for syncope, weakness and numbness.  All other systems reviewed and are negative.   Physical Exam Triage Vital Signs ED Triage Vitals  Enc Vitals Group     BP      Pulse      Resp      Temp      Temp src      SpO2      Weight      Height      Head Circumference      Peak Flow      Pain Score      Pain Loc      Pain Edu?      Excl. in GC?    No data found.  Updated Vital Signs BP (!) 143/94 (BP Location: Left Wrist)   Pulse 80   Temp 98.1 F (36.7 C) (Oral)   Resp 18   LMP  (LMP Unknown) Comment: doesn't have consistent periods  SpO2 98%   Visual Acuity Right Eye Distance:   Left Eye Distance:   Bilateral Distance:    Right Eye Near:   Left Eye Near:    Bilateral Near:     Physical Exam Vitals and nursing note reviewed.  Constitutional:      General: She is not in acute distress.    Appearance: She is well-developed. She is obese. She is not ill-appearing.  HENT:     Head: Normocephalic and atraumatic.     Mouth/Throat:     Mouth: Mucous membranes are moist.  Eyes:     Conjunctiva/sclera: Conjunctivae normal.  Cardiovascular:     Rate and Rhythm: Normal rate and regular rhythm.     Heart sounds: Normal heart sounds.  Pulmonary:      Effort: Pulmonary effort is normal. No respiratory distress.  Breath sounds: Normal breath sounds.  Abdominal:     Palpations: Abdomen is soft.     Tenderness: There is no abdominal tenderness. There is no guarding or rebound.  Musculoskeletal:        General: No swelling, tenderness, deformity or signs of injury. Normal range of motion.     Cervical back: Neck supple.  Skin:    General: Skin is warm and dry.     Findings: No bruising, erythema, lesion or rash.  Neurological:     General: No focal deficit present.     Mental Status: She is alert and oriented to person, place, and time.     Sensory: No sensory deficit.     Motor: No weakness.     Gait: Gait normal.  Psychiatric:        Mood and Affect: Mood normal.        Behavior: Behavior normal.     UC Treatments / Results  Labs (all labs ordered are listed, but only abnormal results are displayed) Labs Reviewed - No data to display  EKG   Radiology No results found.  Procedures Procedures (including critical care time)  Medications Ordered in UC Medications - No data to display  Initial Impression / Assessment and Plan / UC Course  I have reviewed the triage vital signs and the nursing notes.  Pertinent labs & imaging results that were available during my care of the patient were reviewed by me and considered in my medical decision making (see chart for details).  Acute bilateral low back pain with right-sided sciatica.  Patient declined x-ray.  Treating with ibuprofen and methocarbamol.  Precautions for drowsiness with methocarbamol discussed.  Instructed patient to follow-up with her PCP or an orthopedist if her symptoms are not improving.  She agrees to plan of care.   Final Clinical Impressions(s) / UC Diagnoses   Final diagnoses:  Acute bilateral low back pain with right-sided sciatica     Discharge Instructions      Take ibuprofen as needed for discomfort.  Take the muscle relaxer as needed for  muscle spasm; Do not drive, operate machinery, or drink alcohol with this medication as it can cause drowsiness.   Follow up with your primary care provider or an orthopedist if your symptoms are not improving.         ED Prescriptions     Medication Sig Dispense Auth. Provider   methocarbamol (ROBAXIN) 500 MG tablet Take 1 tablet (500 mg total) by mouth 2 (two) times daily as needed for muscle spasms. 10 tablet Mickie Bail, NP   ibuprofen (ADVIL) 600 MG tablet Take 1 tablet (600 mg total) by mouth every 6 (six) hours as needed. 30 tablet Mickie Bail, NP      I have reviewed the PDMP during this encounter.   Mickie Bail, NP 12/27/20 [id number]

## 2021-01-02 ENCOUNTER — Encounter: Payer: Self-pay | Admitting: Certified Nurse Midwife

## 2021-01-02 ENCOUNTER — Other Ambulatory Visit: Payer: Self-pay

## 2021-01-02 ENCOUNTER — Ambulatory Visit (INDEPENDENT_AMBULATORY_CARE_PROVIDER_SITE_OTHER): Payer: Medicaid Other | Admitting: Certified Nurse Midwife

## 2021-01-02 VITALS — BP 122/74 | HR 85 | Resp 16 | Ht 65.0 in | Wt 331.5 lb

## 2021-01-02 DIAGNOSIS — Z30431 Encounter for routine checking of intrauterine contraceptive device: Secondary | ICD-10-CM | POA: Diagnosis not present

## 2021-01-02 DIAGNOSIS — N9419 Other specified dyspareunia: Secondary | ICD-10-CM

## 2021-01-02 DIAGNOSIS — T8384XA Pain from genitourinary prosthetic devices, implants and grafts, initial encounter: Secondary | ICD-10-CM

## 2021-01-02 DIAGNOSIS — Z975 Presence of (intrauterine) contraceptive device: Secondary | ICD-10-CM | POA: Diagnosis not present

## 2021-01-02 NOTE — Patient Instructions (Signed)
Preventive Care 21-28 Years Old, Female Preventive care refers to lifestyle choices and visits with your health care provider that can promote health and wellness. This includes: A yearly physical exam. This is also called an annual wellness visit. Regular dental and eye exams. Immunizations. Screening for certain conditions. Healthy lifestyle choices, such as: Eating a healthy diet. Getting regular exercise. Not using drugs or products that contain nicotine and tobacco. Limiting alcohol use. What can I expect for my preventive care visit? Physical exam Your health care provider may check your: Height and weight. These may be used to calculate your BMI (body mass index). BMI is a measurement that tells if you are at a healthy weight. Heart rate and blood pressure. Body temperature. Skin for abnormal spots. Counseling Your health care provider may ask you questions about your: Past medical problems. Family's medical history. Alcohol, tobacco, and drug use. Emotional well-being. Home life and relationship well-being. Sexual activity. Diet, exercise, and sleep habits. Work and work environment. Access to firearms. Method of birth control. Menstrual cycle. Pregnancy history. What immunizations do I need?  Vaccines are usually given at various ages, according to a schedule. Your health care provider will recommend vaccines for you based on your age, medicalhistory, and lifestyle or other factors, such as travel or where you work. What tests do I need?  Blood tests Lipid and cholesterol levels. These may be checked every 5 years starting at age 20. Hepatitis C test. Hepatitis B test. Screening Diabetes screening. This is done by checking your blood sugar (glucose) after you have not eaten for a while (fasting). STD (sexually transmitted disease) testing, if you are at risk. BRCA-related cancer screening. This may be done if you have a family history of breast, ovarian, tubal, or  peritoneal cancers. Pelvic exam and Pap test. This may be done every 3 years starting at age 21. Starting at age 30, this may be done every 5 years if you have a Pap test in combination with an HPV test. Talk with your health care provider about your test results, treatment options,and if necessary, the need for more tests. Follow these instructions at home: Eating and drinking  Eat a healthy diet that includes fresh fruits and vegetables, whole grains, lean protein, and low-fat dairy products. Take vitamin and mineral supplements as recommended by your health care provider. Do not drink alcohol if: Your health care provider tells you not to drink. You are pregnant, may be pregnant, or are planning to become pregnant. If you drink alcohol: Limit how much you have to 0-1 drink a day. Be aware of how much alcohol is in your drink. In the [place] one drink equals one 12 oz bottle of beer (355 mL), one 5 oz glass of wine (148 mL), or one 1 oz glass of hard liquor (44 mL).  Lifestyle Take daily care of your teeth and gums. Brush your teeth every morning and night with fluoride toothpaste. Floss one time each day. Stay active. Exercise for at least 30 minutes 5 or more days each week. Do not use any products that contain nicotine or tobacco, such as cigarettes, e-cigarettes, and chewing tobacco. If you need help quitting, ask your health care provider. Do not use drugs. If you are sexually active, practice safe sex. Use a condom or other form of protection to prevent STIs (sexually transmitted infections). If you do not wish to become pregnant, use a form of birth control. If you plan to become pregnant, see your health care   provider for a prepregnancy visit. Find healthy ways to cope with stress, such as: Meditation, yoga, or listening to music. Journaling. Talking to a trusted person. Spending time with friends and family. Safety Always wear your seat belt while driving or riding in a  vehicle. Do not drive: If you have been drinking alcohol. Do not ride with someone who has been drinking. When you are tired or distracted. While texting. Wear a helmet and other protective equipment during sports activities. If you have firearms in your house, make sure you follow all gun safety procedures. Seek help if you have been physically or sexually abused. What's next? Go to your health care provider once a year for an annual wellness visit. Ask your health care provider how often you should have your eyes and teeth checked. Stay up to date on all vaccines. This information is not intended to replace advice given to you by your health care provider. Make sure you discuss any questions you have with your healthcare provider. Document Revised: 02/07/2020 Document Reviewed: 02/20/2018 Elsevier Patient Education  2022 [employer].

## 2021-01-02 NOTE — Progress Notes (Signed)
GYNECOLOGY OFFICE ENCOUNTER NOTE  History:  30 y.o. G2P0011 here today for today for IUD string check; Paragard  IUD was placed  12/05/2020. Reports pain with intercourse and feeling like something "has shifted". Partner able to feel strings during intercourse. History of spontaneous IUD expulsion.   Denies difficulty breathing or respiratory distress, chest pain, abdominal pain, excessive vaginal bleeding, dysuria, and leg pain or swelling.   The following portions of the patient's history were reviewed and updated as appropriate: allergies, current medications, past family history, past medical history, past social history, past surgical history and problem list. Last pap smear on 2017 was normal.  Review of Systems:   Pertinent items are noted in HPI.  Objective:   Blood pressure 122/74, pulse 85, resp. rate 16, height 5\' 5"  (1.651 m), weight (!) 331 lb 8 oz (150.4 kg), last menstrual period 10/26/2020.  Physical Exam  CONSTITUTIONAL: Well-developed, well-nourished female in no acute distress.   ABDOMEN: Soft, no distention noted.    PELVIC: Normal appearing external genitalia; normal appearing vaginal mucosa and cervix.  IUD strings visualized, about three (3) cm in length outside cervix.   Assessment:   1. IUD check up - 12/26/2020 PELVIC COMPLETE WITH TRANSVAGINAL; Future  2. IUD (intrauterine device) in place - US PELVIC COMPLETE WITH TRANSVAGINAL; Future  3. Pain due to intrauterine contraceptive device (IUD), initial encounter (HCC) - US PELVIC COMPLETE WITH TRANSVAGINAL; Future  4. Dyspareunia due to medical condition in female - US PELVIC COMPLETE WITH TRANSVAGINAL; Future   Plan:   IUD string trimmed for comfort.   Ultrasound ordered, see chart.   Patient to keep IUD in place for up to 10 years; can come in for removal if she desires pregnancy earlier or for any concerning side effects.  Reviewed red flag symptoms and when to call.   RTC for ANNUAL EXAM and PAP  or sooner if needed.    [place], CNM [facility], [facility]

## 2021-01-26 ENCOUNTER — Ambulatory Visit: Payer: Self-pay | Admitting: General Surgery

## 2021-01-26 NOTE — H&P (View-Only) (Signed)
PATIENT PROFILE: Latasha Alexander is a 30 y.o. female who presents to the Clinic for evaluation of cholelithiasis.  PCP:  Nelwyn Salisbury, PA  HISTORY OF PRESENT ILLNESS: Latasha Alexander has been evaluated multiple times due to right upper quadrant pain with cholelithiasis.  Patient has postpone surgery multiple times before.  Today she reports that she continued having her "gallbladder attacks".  Pain localized in the right upper quadrant.  Pain radiates to her back.  Pain exacerbated by eating.  No alleviating factors.  Patient denies any fever or chills.  Patient denies any nausea or vomiting.   PROBLEM LIST: Problem List  Date Reviewed: 12/29/2020          Noted   History of gestational diabetes mellitus (Chronic) 05/04/2020   Type 2 diabetes mellitus without complication, without long-term current use of insulin (CMS-HCC) 10/17/2017   Overview    Metformin started 3/19       LUQ fullness 10/26/2016   Overview    Abdominal ultrasound ordered. Difficult exam, pt reports "lump" not painful. R/O splenomegaly ordered 10/26/16       BMI 50.0-59.9, adult (CMS-HCC) 09/28/2016   Anxiety and depression 03/16/2016   Overview    Hospitalized with SI in early pregnancy when medications had been stopped, subsequently she was restarted on Latuda, zoloft and abilify. Recommend continuing through pregnancy.  Followed by [facility]           GENERAL REVIEW OF SYSTEMS:   General ROS: negative for - chills, fatigue, fever, weight gain or weight loss Allergy and Immunology ROS: negative for - hives  Hematological and Lymphatic ROS: negative for - bleeding problems or bruising, negative for palpable nodes Endocrine ROS: negative for - heat or cold intolerance, hair changes Respiratory ROS: negative for - cough, shortness of breath or wheezing Cardiovascular ROS: no chest pain or palpitations GI ROS: negative for nausea, vomiting, diarrhea, constipation.  Positive for abdominal  pain Musculoskeletal ROS: negative for - joint swelling or muscle pain Neurological ROS: negative for - confusion, syncope Dermatological ROS: negative for pruritus and rash Psychiatric: negative for anxiety, depression, difficulty sleeping and memory loss  MEDICATIONS: Current Outpatient Medications  Medication Sig Dispense Refill   ACCU-CHEK FASTCLIX LANCET DRUM      blood glucose diagnostic test strip Use to check fasting blood sugars once daily or if you feel that your sugar is too high/low. E11.9 100 each 12   copper intrauterine contraceptive (PARAGARD T 380A) IUD Insert into the uterus     lancing device with lancets kit Use to check fasting blood sugars once daily or if you feel that your sugar is too high/low. E11.9 50 each 12   metFORMIN (GLUCOPHAGE) 500 MG tablet Take 1 tablet (500 mg total) by mouth 2 (two) times daily with meals 60 tablet 2   ABILIFY 10 mg tablet Patient taking 56m QD with plan with psychiatrist to increase to 13mQD (Patient not taking: Reported on 01/26/2021) 30 tablet 0   magnesium gluconate (MAGONATE) 27.5 mg magne- sium (500 mg) tablet Take by mouth (Patient not taking: Reported on 01/26/2021)     sertraline (ZOLOFT) 100 MG tablet TK 2 TS PO D (Patient not taking: Reported on 01/26/2021)  2   Current Facility-Administered Medications  Medication Dose Route Frequency Provider Last Rate Last Admin   medroxyPROGESTERone (DEPO-PROVERA) injection 150 mg  150 mg Intramuscular Q90 Days DoSimeon CraftMD   150 mg at 09/19/20 098366  ALLERGIES: Cashew nut  PAST MEDICAL  HISTORY: Past Medical History:  Diagnosis Date   Abdominal pain of unknown etiology    Acid reflux    Anxiety    Cholecystitis    Depression    Gestational diabetes mellitus (GDM) controlled on oral hypoglycemic drug 09/28/2016   Elevated early 1 hour with normal 3 hour Will need repeat 3 hr at 28 weeks   History of gestational diabetes mellitus 05/04/2020   History of OCD (obsessive  compulsive disorder)    History of panic attacks    Type 2 diabetes mellitus (CMS-HCC)     PAST SURGICAL HISTORY: Past Surgical History:  Procedure Laterality Date   CESAREAN DELIVERY N/A 01/19/2017   Procedure: CESAREAN DELIVERY ONLY;  Surgeon: Karie Georges, MD;  Location: [place];  Service: Obstetrics;  Laterality: N/A;   PR CESAREAN DELIVERY ONLY       FAMILY HISTORY: Family History  Problem Relation Age of Onset   High blood pressure (Hypertension) Mother    Diabetes Mother    Anxiety Mother    No Known Problems Brother    Diabetes type II Maternal Grandmother    Stroke Maternal Grandfather    High blood pressure (Hypertension) Sister    No Known Problems Son      SOCIAL HISTORY: Social History   Socioeconomic History   Marital status: Single  Tobacco Use   Smoking status: Never Smoker   Smokeless tobacco: Never Used   Tobacco comment: vapes sometimes  Vaping Use   Vaping Use: Former  Substance and Sexual Activity   Alcohol use: Not Currently    Comment: occ   Drug use: No   Sexual activity: Yes    Partners: Male    Birth control/protection: OCP  Social History Narrative   Single, but seeing someone.  Has a son born in 2018.  Job: working on it.  Exercise: squats, wall sits, walking.  Wears seatbelt, sunscreen.  Dentist: yes.  Calcium: yes. Veggies: yes. Water: yes.  Avoids soda, juice sweet tea.        Advanced Directives: family knows her wishes.    PHYSICAL EXAM: Vitals:   01/26/21 1351  BP: (!) 140/91  Pulse: 92   Body mass index is 55.77 kg/m. Weight: (!) 149.7 kg (330 lb)   GENERAL: Alert, active, oriented x3  HEENT: Pupils equal reactive to light. Extraocular movements are intact. Sclera clear. Palpebral conjunctiva normal red color.Pharynx clear.  NECK: Supple with no palpable mass and no adenopathy.  LUNGS: Sound clear with no rales rhonchi or wheezes.  HEART: Regular rhythm S1 and S2 without murmur.  ABDOMEN:  Soft and depressible, nontender with no palpable mass, no hepatomegaly.   EXTREMITIES: Well-developed well-nourished symmetrical with no dependent edema.  NEUROLOGICAL: Awake alert oriented, facial expression symmetrical, moving all extremities.  REVIEW OF DATA: I have reviewed the following data today: Office Visit on 12/29/2020  Component Date Value   Hemoglobin A1C 12/29/2020 6.6 (!)   Average Blood Glucose (C* 12/29/2020 143    Sodium 12/29/2020 137    Potassium 12/29/2020 3.9    Chloride 12/29/2020 102    Carbon Dioxide (CO2) 12/29/2020 25    Urea Nitrogen (BUN) 12/29/2020 10    Creatinine 12/29/2020 0.8    Glucose 12/29/2020 116    Calcium 12/29/2020 9.8    Anion Gap 12/29/2020 10    BUN/CREA Ratio 12/29/2020 13    Glomerular Filtration Ra* 12/29/2020 103     EXAM:  ULTRASOUND ABDOMEN LIMITED RIGHT UPPER QUADRANT  COMPARISON:  Abdominal ultrasound December 06, 2017   FINDINGS:  Gallbladder:   Within the gallbladder, there are multiple echogenic foci which move  and shadow consistent with cholelithiasis. Largest gallstone  measures 7 mm in length. There is no gallbladder wall thickening or  pericholecystic fluid. No sonographic Murphy sign noted by  sonographer.   Common bile duct:   Diameter: 4 mm. No intrahepatic or extrahepatic biliary duct  dilatation.   Liver:   No focal lesion identified. Liver echogenicity overall is increased  period. Portal vein is patent on color Doppler imaging with normal  direction of blood flow towards the liver.   Other: None.   IMPRESSION:  1. Cholelithiasis. No gallbladder wall thickening or pericholecystic  fluid.   2. Increase in liver echogenicity, a finding indicative of hepatic  steatosis. No focal liver lesions are demonstrable. It must be  cautioned that the sensitivity of ultrasound for detection of focal  liver lesions is somewhat diminished in this circumstance.    Electronically Signed    By: Lowella Grip III M.D.    On: 04/13/2019 08:30   ASSESSMENT: Latasha Alexander is a 30 y.o. female presenting for consultation for cholelithiasis.    Patient was oriented again about the diagnosis of cholelithiasis. Also oriented about what is the gallbladder, its anatomy and function and the implications of having stones.  We discussed again about the treatment alternatives (observation vs cholecystectomy). Patient was oriented that a low percentage of patient will continue to have similar pain symptoms even after the gallbladder is removed. Surgical technique (open vs laparoscopic) was again discussed. It was also discussed the goals of the surgery (decrease the pain episodes and avoid the risk of cholecystitis) and the risk of surgery including: bleeding, infection, common bile duct injury, stone retention, injury to other organs such as bowel, liver, stomach, other complications such as hernia, bowel obstruction among others. Also discussed with patient about anesthesia and its complications such as: reaction to medications, pneumonia, heart complications, death, among others.   With a discussed again about the importance of losing weight with patient has not been able to get to wellness clinic due to insurance not covering the service.  Also because of patient's recurrent episodes since that is usually with proceed with cholecystectomy.  Patient understood a higher risk of surgery due to her obesity.  Calculus of gallbladder without cholecystitis without obstruction [K80.20]  PLAN: 1.  Robotic assisted laparoscopic cholecystectomy (94076) 2.  CBC, CMP 3.  Do not take aspirin 5 days before the procedure 4.  Contact us if has any question or concern.   Patient verbalized understanding, all questions were answered, and were agreeable with the plan outlined above.     Herbert Pun, MD  Electronically signed by Herbert Pun, MD

## 2021-01-26 NOTE — H&P (Signed)
PATIENT PROFILE: Latasha Alexander is a 30 y.o. female who presents to the Clinic for evaluation of cholelithiasis.  PCP:  Latasha Salisbury, PA  HISTORY OF PRESENT ILLNESS: Latasha Alexander has been evaluated multiple times due to right upper quadrant pain with cholelithiasis.  Patient has postpone surgery multiple times before.  Today she reports that she continued having her "gallbladder attacks".  Pain localized in the right upper quadrant.  Pain radiates to her back.  Pain exacerbated by eating.  No alleviating factors.  Patient denies any fever or chills.  Patient denies any nausea or vomiting.   PROBLEM LIST: Problem List  Date Reviewed: 12/29/2020          Noted   History of gestational diabetes mellitus (Chronic) 05/04/2020   Type 2 diabetes mellitus without complication, without long-term current use of insulin (CMS-HCC) 10/17/2017   Overview    Metformin started 3/19       LUQ fullness 10/26/2016   Overview    Abdominal ultrasound ordered. Difficult exam, pt reports "lump" not painful. R/O splenomegaly ordered 10/26/16       BMI 50.0-59.9, adult (CMS-HCC) 09/28/2016   Anxiety and depression 03/16/2016   Overview    Hospitalized with SI in early pregnancy when medications had been stopped, subsequently she was restarted on Latuda, zoloft and abilify. Recommend continuing through pregnancy.  Followed by [facility]           GENERAL REVIEW OF SYSTEMS:   General ROS: negative for - chills, fatigue, fever, weight gain or weight loss Allergy and Immunology ROS: negative for - hives  Hematological and Lymphatic ROS: negative for - bleeding problems or bruising, negative for palpable nodes Endocrine ROS: negative for - heat or cold intolerance, hair changes Respiratory ROS: negative for - cough, shortness of breath or wheezing Cardiovascular ROS: no chest pain or palpitations GI ROS: negative for nausea, vomiting, diarrhea, constipation.  Positive for abdominal  pain Musculoskeletal ROS: negative for - joint swelling or muscle pain Neurological ROS: negative for - confusion, syncope Dermatological ROS: negative for pruritus and rash Psychiatric: negative for anxiety, depression, difficulty sleeping and memory loss  MEDICATIONS: Current Outpatient Medications  Medication Sig Dispense Refill   ACCU-CHEK FASTCLIX LANCET DRUM      blood glucose diagnostic test strip Use to check fasting blood sugars once daily or if you feel that your sugar is too high/low. E11.9 100 each 12   copper intrauterine contraceptive (PARAGARD T 380A) IUD Insert into the uterus     lancing device with lancets kit Use to check fasting blood sugars once daily or if you feel that your sugar is too high/low. E11.9 50 each 12   metFORMIN (GLUCOPHAGE) 500 MG tablet Take 1 tablet (500 mg total) by mouth 2 (two) times daily with meals 60 tablet 2   ABILIFY 10 mg tablet Patient taking 56m QD with plan with psychiatrist to increase to 13mQD (Patient not taking: Reported on 01/26/2021) 30 tablet 0   magnesium gluconate (MAGONATE) 27.5 mg magne- sium (500 mg) tablet Take by mouth (Patient not taking: Reported on 01/26/2021)     sertraline (ZOLOFT) 100 MG tablet TK 2 TS PO D (Patient not taking: Reported on 01/26/2021)  2   Current Facility-Administered Medications  Medication Dose Route Frequency Provider Last Rate Last Admin   medroxyPROGESTERone (DEPO-PROVERA) injection 150 mg  150 mg Intramuscular Q90 Days DoSimeon CraftMD   150 mg at 09/19/20 098366  ALLERGIES: Cashew nut  PAST MEDICAL  HISTORY: Past Medical History:  Diagnosis Date   Abdominal pain of unknown etiology    Acid reflux    Anxiety    Cholecystitis    Depression    Gestational diabetes mellitus (GDM) controlled on oral hypoglycemic drug 09/28/2016   Elevated early 1 hour with normal 3 hour Will need repeat 3 hr at 28 weeks   History of gestational diabetes mellitus 05/04/2020   History of OCD (obsessive  compulsive disorder)    History of panic attacks    Type 2 diabetes mellitus (CMS-HCC)     PAST SURGICAL HISTORY: Past Surgical History:  Procedure Laterality Date   CESAREAN DELIVERY N/A 01/19/2017   Procedure: CESAREAN DELIVERY ONLY;  Surgeon: Karie Georges, MD;  Location: [place];  Service: Obstetrics;  Laterality: N/A;   PR CESAREAN DELIVERY ONLY       FAMILY HISTORY: Family History  Problem Relation Age of Onset   High blood pressure (Hypertension) Mother    Diabetes Mother    Anxiety Mother    No Known Problems Brother    Diabetes type II Maternal Grandmother    Stroke Maternal Grandfather    High blood pressure (Hypertension) Sister    No Known Problems Son      SOCIAL HISTORY: Social History   Socioeconomic History   Marital status: Single  Tobacco Use   Smoking status: Never Smoker   Smokeless tobacco: Never Used   Tobacco comment: vapes sometimes  Vaping Use   Vaping Use: Former  Substance and Sexual Activity   Alcohol use: Not Currently    Comment: occ   Drug use: No   Sexual activity: Yes    Partners: Male    Birth control/protection: OCP  Social History Narrative   Single, but seeing someone.  Has a son born in 2018.  Job: working on it.  Exercise: squats, wall sits, walking.  Wears seatbelt, sunscreen.  Dentist: yes.  Calcium: yes. Veggies: yes. Water: yes.  Avoids soda, juice sweet tea.        Advanced Directives: family knows her wishes.    PHYSICAL EXAM: Vitals:   01/26/21 1351  BP: (!) 140/91  Pulse: 92   Body mass index is 55.77 kg/m. Weight: (!) 149.7 kg (330 lb)   GENERAL: Alert, active, oriented x3  HEENT: Pupils equal reactive to light. Extraocular movements are intact. Sclera clear. Palpebral conjunctiva normal red color.Pharynx clear.  NECK: Supple with no palpable mass and no adenopathy.  LUNGS: Sound clear with no rales rhonchi or wheezes.  HEART: Regular rhythm S1 and S2 without murmur.  ABDOMEN:  Soft and depressible, nontender with no palpable mass, no hepatomegaly.   EXTREMITIES: Well-developed well-nourished symmetrical with no dependent edema.  NEUROLOGICAL: Awake alert oriented, facial expression symmetrical, moving all extremities.  REVIEW OF DATA: I have reviewed the following data today: Office Visit on 12/29/2020  Component Date Value   Hemoglobin A1C 12/29/2020 6.6 (!)   Average Blood Glucose (C* 12/29/2020 143    Sodium 12/29/2020 137    Potassium 12/29/2020 3.9    Chloride 12/29/2020 102    Carbon Dioxide (CO2) 12/29/2020 25    Urea Nitrogen (BUN) 12/29/2020 10    Creatinine 12/29/2020 0.8    Glucose 12/29/2020 116    Calcium 12/29/2020 9.8    Anion Gap 12/29/2020 10    BUN/CREA Ratio 12/29/2020 13    Glomerular Filtration Ra* 12/29/2020 103     EXAM:  ULTRASOUND ABDOMEN LIMITED RIGHT UPPER QUADRANT  COMPARISON:  Abdominal ultrasound December 06, 2017   FINDINGS:  Gallbladder:   Within the gallbladder, there are multiple echogenic foci which move  and shadow consistent with cholelithiasis. Largest gallstone  measures 7 mm in length. There is no gallbladder wall thickening or  pericholecystic fluid. No sonographic Murphy sign noted by  sonographer.   Common bile duct:   Diameter: 4 mm. No intrahepatic or extrahepatic biliary duct  dilatation.   Liver:   No focal lesion identified. Liver echogenicity overall is increased  period. Portal vein is patent on color Doppler imaging with normal  direction of blood flow towards the liver.   Other: None.   IMPRESSION:  1. Cholelithiasis. No gallbladder wall thickening or pericholecystic  fluid.   2. Increase in liver echogenicity, a finding indicative of hepatic  steatosis. No focal liver lesions are demonstrable. It must be  cautioned that the sensitivity of ultrasound for detection of focal  liver lesions is somewhat diminished in this circumstance.    Electronically Signed    By: Lowella Grip III M.D.    On: 04/13/2019 08:30   ASSESSMENT: Ms. Hornaday is a 30 y.o. female presenting for consultation for cholelithiasis.    Patient was oriented again about the diagnosis of cholelithiasis. Also oriented about what is the gallbladder, its anatomy and function and the implications of having stones.  We discussed again about the treatment alternatives (observation vs cholecystectomy). Patient was oriented that a low percentage of patient will continue to have similar pain symptoms even after the gallbladder is removed. Surgical technique (open vs laparoscopic) was again discussed. It was also discussed the goals of the surgery (decrease the pain episodes and avoid the risk of cholecystitis) and the risk of surgery including: bleeding, infection, common bile duct injury, stone retention, injury to other organs such as bowel, liver, stomach, other complications such as hernia, bowel obstruction among others. Also discussed with patient about anesthesia and its complications such as: reaction to medications, pneumonia, heart complications, death, among others.   With a discussed again about the importance of losing weight with patient has not been able to get to wellness clinic due to insurance not covering the service.  Also because of patient's recurrent episodes since that is usually with proceed with cholecystectomy.  Patient understood a higher risk of surgery due to her obesity.  Calculus of gallbladder without cholecystitis without obstruction [K80.20]  PLAN: 1.  Robotic assisted laparoscopic cholecystectomy (94076) 2.  CBC, CMP 3.  Do not take aspirin 5 days before the procedure 4.  Contact us if has any question or concern.   Patient verbalized understanding, all questions were answered, and were agreeable with the plan outlined above.     Herbert Pun, MD  Electronically signed by Herbert Pun, MD

## 2021-02-07 ENCOUNTER — Inpatient Hospital Stay
Admission: RE | Admit: 2021-02-07 | Discharge: 2021-02-07 | Disposition: A | Discharge status: Home / Self Care | Payer: Medicaid Other | Source: Ambulatory Visit

## 2021-02-07 NOTE — Patient Instructions (Signed)
Your procedure is scheduled on: 02/13/21 - Monday Report to the Registration Desk on the 1st floor of the [facility]. To find out your arrival time, please call [phone] between 1PM - 3PM on: 02/10/21 - Friday Report to [facility] for EKG on   REMEMBER: Instructions that are not followed completely may result in serious medical risk, up to and including death; or upon the discretion of your surgeon and anesthesiologist your surgery may need to be rescheduled.  Do not eat food after midnight the night before surgery.  No gum chewing, lozengers or hard candies.  You may however, drink CLEAR liquids up to 2 hours before you are scheduled to arrive for your surgery. Do not drink anything within 2 hours of your scheduled arrival time. Type 1 and Type 2 diabetics should only drink water.  TAKE THESE MEDICATIONS THE MORNING OF SURGERY WITH A SIP OF WATER:  - ARIPiprazole (ABILIFY) 10 MG tablet - sertraline (ZOLOFT) 100 MG tablet, Take 200 mg by mouth in the morning .  Stop Metformin 2 days prior to surgery.  One week prior to surgery: Stop Anti-inflammatories (NSAIDS) such as Advil, Aleve, Ibuprofen, Motrin, Naproxen, Naprosyn and Aspirin based products such as Excedrin, Goodys Powder, BC Powder.  Stop ANY OVER THE COUNTER supplements until after surgery.  You may however, continue to take Tylenol if needed for pain up until the day of surgery.  No Alcohol for 24 hours before or after surgery.  No Smoking including e-cigarettes for 24 hours prior to surgery.  No chewable tobacco products for at least 6 hours prior to surgery.  No nicotine patches on the day of surgery.  Do not use any "recreational" drugs for at least a week prior to your surgery.  Please be advised that the combination of cocaine and anesthesia may have negative outcomes, up to and including death. If you test positive for cocaine, your surgery will be cancelled.  On the morning of surgery brush your teeth  with toothpaste and water, you may rinse your mouth with mouthwash if you wish. Do not swallow any toothpaste or mouthwash.  Do not wear jewelry, make-up, hairpins, clips or nail polish.  Do not wear lotions, powders, or perfumes.   Do not shave body from the neck down 48 hours prior to surgery just in case you cut yourself which could leave a site for infection.  Also, freshly shaved skin may become irritated if using the CHG soap.  Contact lenses, hearing aids and dentures may not be worn into surgery.  Do not bring valuables to the hospital. [facility] is not responsible for any missing/lost belongings or valuables.   Use CHG Soap or wipes as directed on instruction sheet.  Notify your doctor if there is any change in your medical condition (cold, fever, infection).  Wear comfortable clothing (specific to your surgery type) to the hospital.  After surgery, you can help prevent lung complications by doing breathing exercises.  Take deep breaths and cough every 1-2 hours. Your doctor may order a device called an Incentive Spirometer to help you take deep breaths. When coughing or sneezing, hold a pillow firmly against your incision with both hands. This is called "splinting." Doing this helps protect your incision. It also decreases belly discomfort.  If you are being admitted to the hospital overnight, leave your suitcase in the car. After surgery it may be brought to your room.  If you are being discharged the day of surgery, you will not  be allowed to drive home. You will need a responsible adult (18 years or older) to drive you home and stay with you that night.   If you are taking public transportation, you will need to have a responsible adult (18 years or older) with you. Please confirm with your physician that it is acceptable to use public transportation.   Please call the [facility] at [phone] if you have any questions about these  instructions.  Surgery Visitation Policy:  Patients undergoing a surgery or procedure may have one family member or support person with them as long as that person is not COVID-19 positive or experiencing its symptoms.  That person may remain in the waiting area during the procedure.  Inpatient Visitation:    Visiting hours are 7 a.m. to 8 p.m. Inpatients will be allowed two visitors daily. The visitors may change each day during the patient's stay. No visitors under the age of 10. Any visitor under the age of 63 must be accompanied by an adult. The visitor must pass COVID-19 screenings, use hand sanitizer when entering and exiting the patient's room and wear a mask at all times, including in the patient's room. Patients must also wear a mask when staff or their visitor are in the room. Masking is required regardless of vaccination status.

## 2021-02-08 ENCOUNTER — Other Ambulatory Visit
Admission: RE | Admit: 2021-02-08 | Discharge: 2021-02-08 | Disposition: A | Discharge status: Home / Self Care | Payer: Medicaid Other | Source: Ambulatory Visit | Attending: General Surgery | Admitting: General Surgery

## 2021-02-08 ENCOUNTER — Encounter: Payer: Self-pay | Admitting: Urgent Care

## 2021-02-08 ENCOUNTER — Other Ambulatory Visit: Payer: Self-pay

## 2021-02-08 DIAGNOSIS — Z01810 Encounter for preprocedural cardiovascular examination: Secondary | ICD-10-CM | POA: Insufficient documentation

## 2021-02-08 HISTORY — DX: Gastro-esophageal reflux disease without esophagitis: K21.9

## 2021-02-08 HISTORY — DX: Cardiac murmur, unspecified: R01.1

## 2021-02-08 NOTE — Patient Instructions (Addendum)
Your procedure is scheduled on:02/13/21 Report to the Registration Desk on the 1st floor of the [facility]. To find out your arrival time, please call [phone] between 1PM - 3PM on: 02/10/21  REMEMBER: Instructions that are not followed completely may result in serious medical risk, up to and including death; or upon the discretion of your surgeon and anesthesiologist your surgery may need to be rescheduled.  Do not eat food after midnight the night before surgery.  No gum chewing, lozengers or hard candies.  You may however, drink CLEAR liquids up to 2 hours before you are scheduled to arrive for your surgery. Do not drink anything within 2 hours of your scheduled arrival time. Type 1 and Type 2 diabetics should only drink water.  TAKE THESE MEDICATIONS THE MORNING OF SURGERY WITH A SIP OF WATER: None   One week prior to surgery: Stop Anti-inflammatories (NSAIDS) such as Advil, Aleve, Ibuprofen, Motrin, Naproxen, Naprosyn and Aspirin based products such as Excedrin, Goodys Powder, BC Powder.  Stop ANY OVER THE COUNTER supplements until after surgery.  You may  take Tylenol as directed if needed for pain up until the day of surgery.  No Alcohol for 24 hours before or after surgery.  No Smoking including e-cigarettes for 24 hours prior to surgery.  No chewable tobacco products for at least 6 hours prior to surgery.  No nicotine patches on the day of surgery.  Do not use any "recreational" drugs for at least a week prior to your surgery.  Please be advised that the combination of cocaine and anesthesia may have negative outcomes, up to and including death. If you test positive for cocaine, your surgery will be cancelled.  On the morning of surgery brush your teeth with toothpaste and water, you may rinse your mouth with mouthwash if you wish. Do not swallow any toothpaste or mouthwash.  Do not wear jewelry, make-up, hairpins, clips or nail polish.  Do not wear lotions,  powders, or perfumes.   Do not shave body from the neck down 48 hours prior to surgery just in case you cut yourself which could leave a site for infection.  Also, freshly shaved skin may become irritated if using the CHG soap.  Contact lenses, hearing aids and dentures may not be worn into surgery.  Do not bring valuables to the hospital. [facility] is not responsible for any missing/lost belongings or valuables.   Use CHG Soap or wipes as directed on instruction sheet.  Notify your doctor if there is any change in your medical condition (cold, fever, infection).  Wear comfortable clothing (specific to your surgery type) to the hospital.  After surgery, you can help prevent lung complications by doing breathing exercises.  Take deep breaths and cough every 1-2 hours. Your doctor may order a device called an Incentive Spirometer to help you take deep breaths. When coughing or sneezing, hold a pillow firmly against your incision with both hands. This is called "splinting." Doing this helps protect your incision. It also decreases belly discomfort.  If you are being admitted to the hospital overnight, leave your suitcase in the car. After surgery it may be brought to your room.  If you are being discharged the day of surgery, you will not be allowed to drive home. You will need a responsible adult (18 years or older) to drive you home and stay with you that night.   If you are taking public transportation, you will need to have a responsible adult (  18 years or older) with you. Please confirm with your physician that it is acceptable to use public transportation.   Please call the [facility]missions [facility] Dept. at [phone] if you have any questions about these instructions.  Surgery Visitation Policy:  Patients undergoing a surgery or procedure may have one family member or support person with them as long as that person is not COVID-19 positive or experiencing its symptoms.  That  person may remain in the waiting area during the procedure.  Inpatient Visitation:    Visiting hours are 7 a.m. to 8 p.m. Inpatients will be allowed two visitors daily. The visitors may change each day during the patient's stay. No visitors under the age of 65. Any visitor under the age of 55 must be accompanied by an adult. The visitor must pass COVID-19 screenings, use hand sanitizer when entering and exiting the patient's room and wear a mask at all times, including in the patient's room. Patients must also wear a mask when staff or their visitor are in the room. Masking is required regardless of vaccination status.

## 2021-02-09 ENCOUNTER — Ambulatory Visit
Admission: EM | Admit: 2021-02-09 | Discharge: 2021-02-09 | Disposition: A | Discharge status: Other Acute Inpatient Hospital | Payer: Medicaid Other

## 2021-02-09 ENCOUNTER — Other Ambulatory Visit: Payer: Self-pay

## 2021-02-09 ENCOUNTER — Encounter: Payer: Self-pay | Admitting: Emergency Medicine

## 2021-02-09 DIAGNOSIS — R1011 Right upper quadrant pain: Secondary | ICD-10-CM | POA: Diagnosis not present

## 2021-02-09 NOTE — ED Notes (Signed)
Patient is being discharged from the Urgent Care and sent to the Emergency Department via POV . Per Berneta Sages, NP, patient is in need of higher level of care due to Severe abdominal pain. Patient is aware and verbalizes understanding of plan of care.  Vitals:   02/09/21 1830  BP: (!) 147/95  Pulse: 80  Resp: 16  Temp: 98.6 F (37 C)  SpO2: 99%

## 2021-02-09 NOTE — ED Triage Notes (Signed)
PT is getting her gallbladder removed Monday. She states a gallbladder attack started 1.5 hours ago. She is having RUQ pain that extends to back, nausea, and has vomited once.

## 2021-02-09 NOTE — Discharge Instructions (Addendum)
Please go to the emergency department at [facility] for evaluation of your right upper quadrant abdominal pain to rule out acute cholecystitis.

## 2021-02-09 NOTE — ED Provider Notes (Signed)
[facility]    CSN: [id number] Arrival date & time: 02/09/21  1807      History   Chief Complaint Chief Complaint  Patient presents with   Abdominal Pain    HPI JALENA VANDERLINDEN is a 30 y.o. female.   HPI  30 year old female here for evaluation of right upper quadrant pain.  Patient is scheduled to have cholecystectomy in 4 days but she is here tonight for evaluation because 1.5 hours ago she developed sharp right upper quadrant pain that has been constant ever since.  This is also been associated with nausea.  Patient states that she ate a grilled cheese several hours before work and she is not sure if this contributed to her pain or not.  She denies any fever or vomiting.  Patient reports that she did take a 400 mg magnesium tablet which has helped ease the pain off but it is still present.  Past Medical History:  Diagnosis Date   Anxiety    COVID-19 2021   Depression    Diabetes mellitus without complication (HCC)    GERD (gastroesophageal reflux disease)    Heart murmur    Heartburn    Hypercholesterolemia    OCD (obsessive compulsive disorder)    Panic attacks    PONV (postoperative nausea and vomiting)    after cesarean    Patient Active Problem List   Diagnosis Date Noted   Breakthrough bleeding on depo provera 08/18/2020   OCD (obsessive compulsive disorder) 07/17/2016   Severe recurrent major depression without psychotic features (HCC) 07/16/2016   Pregnant 07/16/2016   Anxiety and depression 07/08/2016   Urinary symptom or sign 07/08/2016   Morbid obesity with BMI of 45.0-49.9, adult (HCC) 06/06/2016    Past Surgical History:  Procedure Laterality Date   CESAREAN SECTION     none      OB History     Gravida  2   Para      Term      Preterm      AB  1   Living  1      SAB      IAB      Ectopic      Multiple      Live Births               Home Medications    Prior to Admission medications   Medication  Sig Start Date End Date Taking? Authorizing Provider  ARIPiprazole (ABILIFY) 10 MG tablet Take 10 mg by mouth daily.   Yes [provider]  Magnesium 500 MG TABS Take 500 mg by mouth daily as needed (gallstones).    Yes [provider]  sertraline (ZOLOFT) 100 MG tablet Take 200 mg by mouth in the morning and at bedtime.   Yes [provider]  paragard intrauterine copper IUD IUD 1 each by Intrauterine route once.    [provider]  fluticasone (FLONASE) 50 MCG/ACT nasal spray Place 2 sprays into both nostrils daily. Patient not taking: No sig reported 08/09/16 12/03/19  Domenick Gong, MD  LATUDA 40 MG TABS tablet TAKE 1 TABLET BY MOUTH EVERY DAY WITH SUPPER Patient not taking: No sig reported 08/20/16 12/03/19  Hildred Laser, MD  medroxyPROGESTERone (DEPO-PROVERA) 150 MG/ML injection  06/20/20 09/12/20  [provider]  promethazine (PHENERGAN) 12.5 MG tablet Take 1 tablet (12.5 mg total) by mouth every 6 (six) hours as needed for nausea or vomiting. Patient not taking: Reported on 04/29/2019  07/05/16 12/03/19  Hildred Laser, MD    Family History Family History  Problem Relation Age of Onset   Diabetes Mother    Hypertension Mother    Hypertension Sister    Stroke Maternal Grandfather     Social History Social History   Tobacco Use   Smoking status: Former    Packs/day: 0.25    Types: Cigarettes   Smokeless tobacco: Never  Vaping Use   Vaping Use: Every day   Substances: Nicotine, Flavoring  Substance Use Topics   Alcohol use: No    Alcohol/week: 0.0 standard drinks   Drug use: No     Allergies   Cashew nut oil   Review of Systems Review of Systems  Constitutional:  Negative for activity change, appetite change and fever.  Gastrointestinal:  Positive for abdominal pain and nausea. Negative for diarrhea and vomiting.  Hematological: Negative.   Psychiatric/Behavioral: Negative.      Physical Exam Triage Vital Signs ED  Triage Vitals  Enc Vitals Group     BP 02/09/21 1830 (!) 147/95     Pulse Rate 02/09/21 1830 80     Resp 02/09/21 1830 16     Temp 02/09/21 1830 98.6 F (37 C)     Temp Source 02/09/21 1830 Oral     SpO2 02/09/21 1830 99 %     Weight --      Height --      Head Circumference --      Peak Flow --      Pain Score 02/09/21 1831 7     Pain Loc --      Pain Edu? --      Excl. in GC? --    No data found.  Updated Vital Signs BP (!) 147/95   Pulse 80   Temp 98.6 F (37 C) (Oral)   Resp 16   LMP 02/05/2021 (Approximate)   SpO2 99%   Visual Acuity Right Eye Distance:   Left Eye Distance:   Bilateral Distance:    Right Eye Near:   Left Eye Near:    Bilateral Near:     Physical Exam Vitals and nursing note reviewed.  Constitutional:      General: She is in acute distress.     Appearance: She is well-developed. She is obese. She is not ill-appearing.  HENT:     Head: Normocephalic and atraumatic.  Cardiovascular:     Rate and Rhythm: Normal rate and regular rhythm.     Heart sounds: Normal heart sounds. No murmur heard.   No gallop.  Pulmonary:     Effort: Pulmonary effort is normal.     Breath sounds: Normal breath sounds. No wheezing, rhonchi or rales.  Abdominal:     General: Abdomen is protuberant. Bowel sounds are normal. There is no distension.     Palpations: Abdomen is soft. There is no hepatomegaly or splenomegaly.     Tenderness: There is abdominal tenderness in the right upper quadrant. There is no guarding or rebound. Positive signs include Murphy's sign.  Skin:    General: Skin is warm and dry.     Capillary Refill: Capillary refill takes less than 2 seconds.     Findings: No rash.  Neurological:     General: No focal deficit present.     Mental Status: She is alert and oriented to person, place, and time.  Psychiatric:        Mood and Affect: Mood normal.  Behavior: Behavior normal.     UC Treatments / Results  Labs (all labs ordered are  listed, but only abnormal results are displayed) Labs Reviewed - No data to display  EKG   Radiology No results found.  Procedures Procedures (including critical care time)  Medications Ordered in UC Medications - No data to display  Initial Impression / Assessment and Plan / UC Course  I have reviewed the triage vital signs and the nursing notes.  Pertinent labs & imaging results that were available during my care of the patient were reviewed by me and considered in my medical decision making (see chart for details).  Patient is a pleasant 30 year old female who appears to be in the in a great deal of pain here for evaluation of right upper quadrant pain that started 1.5 hours ago and is still present and constant.  She has known gallbladder disease and gallstones with a scheduled cholecystectomy 4 days from now.  She states that she ate a grilled cheese several hours before work and was at work for approximately 3 hours before her symptoms started.  Patient's not had a fever or vomiting but she is nauseated.  Physical exam reveals benign cardiopulmonary exam with clear lung sounds in all fields.  Abdomen is protuberant but soft.  Patient does have right upper quadrant pain with a positive Murphy sign.  Advised patient that I am unable to evaluate her gallbladder and gallstones to see if one has become lodged in the necked upper bile duct as I do not have ultrasound.  I am also unable to run labs to evaluate for increase in white count or increase in her liver enzymes.  It is therefore my recommendation to the patient that she go to the emergency department at [facility] where she can be evaluated, be given pain medication, and have an ultrasound to look for acute cholecystitis.  Patient verbalized an agreement and she left via POV to go to [facility] in stable condition.   Final Clinical Impressions(s) / UC Diagnoses   Final diagnoses:  Abdominal pain, right upper quadrant   Discharge Instructions    None    ED Prescriptions   None    PDMP not reviewed this encounter.   Becky Augusta, NP 02/09/21 [phone]

## 2021-02-13 ENCOUNTER — Ambulatory Visit
Admission: RE | Admit: 2021-02-13 | Discharge: 2021-02-13 | Disposition: A | Discharge status: Home / Self Care | Payer: Medicaid Other | Attending: General Surgery | Admitting: General Surgery

## 2021-02-13 ENCOUNTER — Other Ambulatory Visit: Payer: Self-pay

## 2021-02-13 ENCOUNTER — Ambulatory Visit: Payer: Medicaid Other | Admitting: Registered Nurse

## 2021-02-13 ENCOUNTER — Encounter
Admission: RE | Disposition: A | Discharge status: Home / Self Care | Payer: Self-pay | Source: Home / Self Care | Attending: General Surgery

## 2021-02-13 ENCOUNTER — Encounter: Payer: Self-pay | Admitting: General Surgery

## 2021-02-13 DIAGNOSIS — Z6841 Body Mass Index (BMI) 40.0 and over, adult: Secondary | ICD-10-CM | POA: Diagnosis not present

## 2021-02-13 DIAGNOSIS — Z79899 Other long term (current) drug therapy: Secondary | ICD-10-CM | POA: Insufficient documentation

## 2021-02-13 DIAGNOSIS — Z7984 Long term (current) use of oral hypoglycemic drugs: Secondary | ICD-10-CM | POA: Insufficient documentation

## 2021-02-13 DIAGNOSIS — K8010 Calculus of gallbladder with chronic cholecystitis without obstruction: Secondary | ICD-10-CM | POA: Insufficient documentation

## 2021-02-13 LAB — POCT PREGNANCY, URINE: Preg Test, Ur: NEGATIVE

## 2021-02-13 LAB — BASIC METABOLIC PANEL
Anion gap: 9 (ref 5–15)
BUN: 17 mg/dL (ref 6–20)
CO2: 24 mmol/L (ref 22–32)
Calcium: 9 mg/dL (ref 8.9–10.3)
Chloride: 104 mmol/L (ref 98–111)
Creatinine, Ser: 0.51 mg/dL (ref 0.44–1.00)
GFR, Estimated: >60 mL/min (ref >60)
Glucose, Bld: 112 mg/dL — ABNORMAL HIGH (ref 70–99)
Potassium: 4.1 mmol/L (ref 3.5–5.1)
Sodium: 137 mmol/L (ref 135–145)

## 2021-02-13 LAB — GLUCOSE, CAPILLARY
Glucose-Capillary: 107 mg/dL — ABNORMAL HIGH (ref 70–99)
Glucose-Capillary: 146 mg/dL — ABNORMAL HIGH (ref 70–99)

## 2021-02-13 SURGERY — CHOLECYSTECTOMY, ROBOT-ASSISTED, LAPAROSCOPIC
Anesthesia: General | Site: Abdomen

## 2021-02-13 MED ORDER — DEXAMETHASONE SODIUM PHOSPHATE 10 MG/ML IJ SOLN
INTRAMUSCULAR | Status: DC | PRN
  Administered 2021-02-13: 10 mg via INTRAVENOUS

## 2021-02-13 MED ORDER — LIDOCAINE HCL (CARDIAC) PF 100 MG/5ML IV SOSY
PREFILLED_SYRINGE | INTRAVENOUS | Status: DC | PRN
  Administered 2021-02-13: 100 mg via INTRAVENOUS

## 2021-02-13 MED ORDER — SUCCINYLCHOLINE CHLORIDE 200 MG/10ML IV SOSY
PREFILLED_SYRINGE | INTRAVENOUS | Status: AC
  Filled 2021-02-13: qty 10

## 2021-02-13 MED ORDER — ALBUTEROL SULFATE HFA 108 (90 BASE) MCG/ACT IN AERS
INHALATION_SPRAY | RESPIRATORY_TRACT | Status: DC | PRN
  Administered 2021-02-13: 2 via RESPIRATORY_TRACT

## 2021-02-13 MED ORDER — CHLORHEXIDINE GLUCONATE 0.12 % MT SOLN: 15.0000 mL | Freq: Once | OROMUCOSAL | Status: AC

## 2021-02-13 MED ORDER — FENTANYL CITRATE (PF) 100 MCG/2ML IJ SOLN
INTRAMUSCULAR | Status: AC
  Filled 2021-02-13: qty 2

## 2021-02-13 MED ORDER — SODIUM CHLORIDE 0.9 % IV SOLN: INTRAVENOUS | Status: DC

## 2021-02-13 MED ORDER — ORAL CARE MOUTH RINSE: 15.0000 mL | Freq: Once | OROMUCOSAL | Status: AC

## 2021-02-13 MED ORDER — HYDROCODONE-ACETAMINOPHEN 5-325 MG PO TABS
1.0000 | ORAL_TABLET | Freq: Once | ORAL | Status: AC
  Administered 2021-02-13: 1 via ORAL

## 2021-02-13 MED ORDER — ONDANSETRON HCL 4 MG/2ML IJ SOLN
INTRAMUSCULAR | Status: DC | PRN
  Administered 2021-02-13: 4 mg via INTRAVENOUS

## 2021-02-13 MED ORDER — IPRATROPIUM-ALBUTEROL 0.5-2.5 (3) MG/3ML IN SOLN: 3.0000 mL | Freq: Once | RESPIRATORY_TRACT | Status: AC

## 2021-02-13 MED ORDER — IPRATROPIUM-ALBUTEROL 0.5-2.5 (3) MG/3ML IN SOLN
RESPIRATORY_TRACT | Status: AC
  Administered 2021-02-13: 3 mL via RESPIRATORY_TRACT
  Filled 2021-02-13: qty 3

## 2021-02-13 MED ORDER — CEFAZOLIN SODIUM-DEXTROSE 2-4 GM/100ML-% IV SOLN
2.0000 g | INTRAVENOUS | Status: AC
Start: 2021-02-13 — End: 2021-02-13
  Administered 2021-02-13: 2 g via INTRAVENOUS

## 2021-02-13 MED ORDER — LIDOCAINE HCL (PF) 2 % IJ SOLN
INTRAMUSCULAR | Status: AC
  Filled 2021-02-13: qty 5

## 2021-02-13 MED ORDER — ACETAMINOPHEN 10 MG/ML IV SOLN
INTRAVENOUS | Status: DC | PRN
  Administered 2021-02-13: 1000 mg via INTRAVENOUS

## 2021-02-13 MED ORDER — MIDAZOLAM HCL 2 MG/2ML IJ SOLN
INTRAMUSCULAR | Status: AC
  Filled 2021-02-13: qty 2

## 2021-02-13 MED ORDER — MEPERIDINE HCL 25 MG/ML IJ SOLN: 6.2500 mg | INTRAMUSCULAR | Status: DC | PRN

## 2021-02-13 MED ORDER — FENTANYL CITRATE (PF) 100 MCG/2ML IJ SOLN
INTRAMUSCULAR | Status: AC
  Administered 2021-02-13: 50 ug via INTRAVENOUS
  Filled 2021-02-13: qty 2

## 2021-02-13 MED ORDER — APREPITANT 40 MG PO CAPS
ORAL_CAPSULE | ORAL | Status: AC
  Administered 2021-02-13: 40 mg via ORAL
  Filled 2021-02-13: qty 1

## 2021-02-13 MED ORDER — FAMOTIDINE 20 MG PO TABS: 20.0000 mg | ORAL_TABLET | Freq: Once | ORAL | Status: AC

## 2021-02-13 MED ORDER — INDOCYANINE GREEN 25 MG IV SOLR
1.2500 mg | Freq: Once | INTRAVENOUS | Status: AC
  Administered 2021-02-13: 1.25 mg via INTRAVENOUS
  Filled 2021-02-13: qty 0.5

## 2021-02-13 MED ORDER — BUPIVACAINE-EPINEPHRINE 0.25% -1:200000 IJ SOLN
INTRAMUSCULAR | Status: DC | PRN
  Administered 2021-02-13: 30 mL

## 2021-02-13 MED ORDER — SUGAMMADEX SODIUM 200 MG/2ML IV SOLN
INTRAVENOUS | Status: DC | PRN
  Administered 2021-02-13: 500 mg via INTRAVENOUS

## 2021-02-13 MED ORDER — ONDANSETRON HCL 4 MG/2ML IJ SOLN
4.0000 mg | Freq: Once | INTRAMUSCULAR | Status: AC | PRN
  Administered 2021-02-13: 4 mg via INTRAVENOUS

## 2021-02-13 MED ORDER — ROCURONIUM BROMIDE 100 MG/10ML IV SOLN
INTRAVENOUS | Status: DC | PRN
  Administered 2021-02-13: 50 mg via INTRAVENOUS
  Administered 2021-02-13: 10 mg via INTRAVENOUS

## 2021-02-13 MED ORDER — FENTANYL CITRATE (PF) 100 MCG/2ML IJ SOLN
25.0000 ug | INTRAMUSCULAR | Status: DC | PRN
  Administered 2021-02-13 (×2): 50 ug via INTRAVENOUS

## 2021-02-13 MED ORDER — KETOROLAC TROMETHAMINE 30 MG/ML IJ SOLN
INTRAMUSCULAR | Status: AC
  Filled 2021-02-13: qty 1

## 2021-02-13 MED ORDER — ACETAMINOPHEN 10 MG/ML IV SOLN
INTRAVENOUS | Status: AC
  Filled 2021-02-13: qty 100

## 2021-02-13 MED ORDER — DEXMEDETOMIDINE (PRECEDEX) IN NS 20 MCG/5ML (4 MCG/ML) IV SYRINGE
PREFILLED_SYRINGE | INTRAVENOUS | Status: DC | PRN
  Administered 2021-02-13: 12 ug via INTRAVENOUS

## 2021-02-13 MED ORDER — SUCCINYLCHOLINE CHLORIDE 200 MG/10ML IV SOSY
PREFILLED_SYRINGE | INTRAVENOUS | Status: DC | PRN
Start: 2021-02-13 — End: 2021-02-13
  Administered 2021-02-13: 160 mg via INTRAVENOUS

## 2021-02-13 MED ORDER — SEVOFLURANE IN SOLN
RESPIRATORY_TRACT | Status: AC
  Filled 2021-02-13: qty 250

## 2021-02-13 MED ORDER — APREPITANT 40 MG PO CAPS: 40.0000 mg | ORAL_CAPSULE | Freq: Once | ORAL | Status: AC

## 2021-02-13 MED ORDER — HYDROCODONE-ACETAMINOPHEN 5-325 MG PO TABS: 1.0000 | ORAL_TABLET | ORAL | 0 refills | Status: AC | PRN

## 2021-02-13 MED ORDER — SUGAMMADEX SODIUM 500 MG/5ML IV SOLN
INTRAVENOUS | Status: AC
  Filled 2021-02-13: qty 5

## 2021-02-13 MED ORDER — PROPOFOL 10 MG/ML IV BOLUS
INTRAVENOUS | Status: AC
  Filled 2021-02-13: qty 20

## 2021-02-13 MED ORDER — FAMOTIDINE 20 MG PO TABS
ORAL_TABLET | ORAL | Status: AC
  Administered 2021-02-13: 20 mg via ORAL
  Filled 2021-02-13: qty 1

## 2021-02-13 MED ORDER — HYDROCODONE-ACETAMINOPHEN 5-325 MG PO TABS
ORAL_TABLET | ORAL | Status: AC
  Filled 2021-02-13: qty 1

## 2021-02-13 MED ORDER — ONDANSETRON HCL 4 MG/2ML IJ SOLN
INTRAMUSCULAR | Status: AC
  Filled 2021-02-13: qty 2

## 2021-02-13 MED ORDER — DEXAMETHASONE SODIUM PHOSPHATE 10 MG/ML IJ SOLN
INTRAMUSCULAR | Status: AC
  Filled 2021-02-13: qty 1

## 2021-02-13 MED ORDER — MIDAZOLAM HCL 2 MG/2ML IJ SOLN
INTRAMUSCULAR | Status: DC | PRN
  Administered 2021-02-13: 2 mg via INTRAVENOUS

## 2021-02-13 MED ORDER — FENTANYL CITRATE (PF) 100 MCG/2ML IJ SOLN
INTRAMUSCULAR | Status: DC | PRN
  Administered 2021-02-13: 50 ug via INTRAVENOUS
  Administered 2021-02-13: 25 ug via INTRAVENOUS
  Administered 2021-02-13: 50 ug via INTRAVENOUS
  Administered 2021-02-13 (×2): 25 ug via INTRAVENOUS

## 2021-02-13 MED ORDER — CEFAZOLIN SODIUM-DEXTROSE 2-4 GM/100ML-% IV SOLN
INTRAVENOUS | Status: AC
  Filled 2021-02-13: qty 100

## 2021-02-13 MED ORDER — PHENYLEPHRINE HCL (PRESSORS) 10 MG/ML IV SOLN
INTRAVENOUS | Status: DC | PRN
  Administered 2021-02-13: 100 ug via INTRAVENOUS

## 2021-02-13 MED ORDER — ROCURONIUM BROMIDE 10 MG/ML (PF) SYRINGE
PREFILLED_SYRINGE | INTRAVENOUS | Status: AC
  Filled 2021-02-13: qty 10

## 2021-02-13 MED ORDER — BUPIVACAINE-EPINEPHRINE (PF) 0.25% -1:200000 IJ SOLN
INTRAMUSCULAR | Status: AC
  Filled 2021-02-13: qty 30

## 2021-02-13 MED ORDER — PROPOFOL 10 MG/ML IV BOLUS
INTRAVENOUS | Status: DC | PRN
  Administered 2021-02-13: 200 mg via INTRAVENOUS

## 2021-02-13 MED ORDER — KETOROLAC TROMETHAMINE 30 MG/ML IJ SOLN
INTRAMUSCULAR | Status: DC | PRN
  Administered 2021-02-13: 30 mg via INTRAVENOUS

## 2021-02-13 MED ORDER — ALBUTEROL SULFATE HFA 108 (90 BASE) MCG/ACT IN AERS
INHALATION_SPRAY | RESPIRATORY_TRACT | Status: AC
  Filled 2021-02-13: qty 6.7

## 2021-02-13 MED ORDER — CHLORHEXIDINE GLUCONATE 0.12 % MT SOLN
OROMUCOSAL | Status: AC
  Administered 2021-02-13: 15 mL via OROMUCOSAL
  Filled 2021-02-13: qty 15

## 2021-02-13 SURGICAL SUPPLY — 53 items
ADH SKN CLS APL DERMABOND .7 (GAUZE/BANDAGES/DRESSINGS) ×2
APL PRP STRL LF DISP 70% ISPRP (MISCELLANEOUS) ×2
BAG INFUSER PRESSURE 100CC (MISCELLANEOUS) IMPLANT
BAG SPEC RTRVL LRG 6X4 10 (ENDOMECHANICALS) ×2
BLADE SURG SZ11 CARB STEEL (BLADE) ×3 IMPLANT
CANISTER SUCT 1200ML W/VALVE (MISCELLANEOUS) IMPLANT
CANNULA REDUC XI 12-8 STAPL (CANNULA) ×1
CANNULA REDUCER 12-8 DVNC XI (CANNULA) ×2 IMPLANT
CHLORAPREP W/TINT 26 (MISCELLANEOUS) ×3 IMPLANT
CLIP VESOLOCK MED LG 6/CT (CLIP) ×3 IMPLANT
DECANTER SPIKE VIAL GLASS SM (MISCELLANEOUS) ×6 IMPLANT
DEFOGGER SCOPE WARMER CLEARIFY (MISCELLANEOUS) ×3 IMPLANT
DERMABOND ADVANCED (GAUZE/BANDAGES/DRESSINGS) ×1
DERMABOND ADVANCED .7 DNX12 (GAUZE/BANDAGES/DRESSINGS) ×2 IMPLANT
DRAPE ARM DVNC X/XI (DISPOSABLE) ×8 IMPLANT
DRAPE COLUMN DVNC XI (DISPOSABLE) ×2 IMPLANT
DRAPE DA VINCI XI ARM (DISPOSABLE) ×4
DRAPE DA VINCI XI COLUMN (DISPOSABLE) ×1
ELECT REM PT RETURN 9FT ADLT (ELECTROSURGICAL) ×3
ELECTRODE REM PT RTRN 9FT ADLT (ELECTROSURGICAL) ×2 IMPLANT
GAUZE 4X4 16PLY ~~LOC~~+RFID DBL (SPONGE) ×3 IMPLANT
GLOVE SURG ENC MOIS LTX SZ6.5 (GLOVE) ×12 IMPLANT
GLOVE SURG UNDER POLY LF SZ6.5 (GLOVE) ×12 IMPLANT
GOWN STRL REUS W/ TWL LRG LVL3 (GOWN DISPOSABLE) ×6 IMPLANT
GOWN STRL REUS W/TWL LRG LVL3 (GOWN DISPOSABLE) ×9
GRASPER SUT TROCAR 14GX15 (MISCELLANEOUS) ×3 IMPLANT
IRRIGATOR SUCT 8 DISP DVNC XI (IRRIGATION / IRRIGATOR) IMPLANT
IRRIGATOR SUCTION 8MM XI DISP (IRRIGATION / IRRIGATOR)
IV NS 1000ML (IV SOLUTION)
IV NS 1000ML BAXH (IV SOLUTION) IMPLANT
KIT PINK PAD W/HEAD ARE REST (MISCELLANEOUS) ×3
KIT PINK PAD W/HEAD ARM REST (MISCELLANEOUS) ×2 IMPLANT
LABEL OR SOLS (LABEL) ×3 IMPLANT
MANIFOLD NEPTUNE II (INSTRUMENTS) ×3 IMPLANT
NEEDLE HYPO 22GX1.5 SAFETY (NEEDLE) ×3 IMPLANT
NEEDLE INSUFFLATION 14GA 120MM (NEEDLE) ×3 IMPLANT
NS IRRIG 500ML POUR BTL (IV SOLUTION) ×3 IMPLANT
OBTURATOR OPTICAL STANDARD 8MM (TROCAR) ×1
OBTURATOR OPTICAL STND 8 DVNC (TROCAR) ×2
OBTURATOR OPTICALSTD 8 DVNC (TROCAR) ×2 IMPLANT
PACK LAP CHOLECYSTECTOMY (MISCELLANEOUS) ×3 IMPLANT
POUCH SPECIMEN RETRIEVAL 10MM (ENDOMECHANICALS) ×3 IMPLANT
SEAL CANN UNIV 5-8 DVNC XI (MISCELLANEOUS) ×6 IMPLANT
SEAL XI 5MM-8MM UNIVERSAL (MISCELLANEOUS) ×3
SET TUBE SMOKE EVAC HIGH FLOW (TUBING) ×3 IMPLANT
SOLUTION ELECTROLUBE (MISCELLANEOUS) ×3 IMPLANT
SPONGE T-LAP 4X18 ~~LOC~~+RFID (SPONGE) IMPLANT
STAPLER CANNULA SEAL DVNC XI (STAPLE) ×2 IMPLANT
STAPLER CANNULA SEAL XI (STAPLE) ×1
SUT MNCRL 4-0 (SUTURE) ×3
SUT MNCRL 4-0 27XMFL (SUTURE) ×2
SUT VICRYL 0 AB UR-6 (SUTURE) ×3 IMPLANT
SUTURE MNCRL 4-0 27XMF (SUTURE) ×2 IMPLANT

## 2021-02-13 NOTE — Anesthesia Preprocedure Evaluation (Signed)
Anesthesia Evaluation  Patient identified by MRN, date of birth, ID band Patient awake    Reviewed: Allergy & Precautions, NPO status , Patient's Chart, lab work & pertinent test results  History of Anesthesia Complications (+) PONV and history of anesthetic complications  Airway Mallampati: II  TM Distance: >3 FB Neck ROM: Full    Dental no notable dental hx.    Pulmonary Patient abstained from smoking., former smoker (Vapes),           Cardiovascular Normal cardiovascular exam+ Valvular Problems/Murmurs      Neuro/Psych PSYCHIATRIC DISORDERS Anxiety Depression negative neurological ROS     GI/Hepatic Neg liver ROS, GERD  ,  Endo/Other  diabetesMorbid obesity  Renal/GU negative Renal ROS  negative genitourinary   Musculoskeletal negative musculoskeletal ROS (+)   Abdominal (+) + obese,   Peds negative pediatric ROS (+)  Hematology negative hematology ROS (+)   Anesthesia Other Findings Anxiety    COVID-19 2021   Depression    Diabetes mellitus without complication (HCC)    GERD (gastroesophageal reflux disease)    Heart murmur    Heartburn    Hypercholesterolemia    OCD (obsessive compulsive disorder) Panic attacks    PONV (postoperative nausea and vomiting)  after cesarean     Reproductive/Obstetrics negative OB ROS                           Anesthesia Physical Anesthesia Plan  ASA: 3  Anesthesia Plan: General   Post-op Pain Management:    Induction: Intravenous  PONV Risk Score and Plan: 3 and Ondansetron and Midazolam  Airway Management Planned: Oral ETT  Additional Equipment:   Intra-op Plan:   Post-operative Plan: Extubation in OR  Informed Consent: I have reviewed the patients History and Physical, chart, labs and discussed the procedure including the risks, benefits and alternatives for the proposed anesthesia with the patient or authorized representative who  has indicated his/her understanding and acceptance.       Plan Discussed with: CRNA, Anesthesiologist and Surgeon  Anesthesia Plan Comments:         Anesthesia Quick Evaluation

## 2021-02-13 NOTE — Anesthesia Procedure Notes (Addendum)
Procedure Name: Intubation Date/Time: 02/13/2021 10:42 AM Performed by: Hedda Slade, CRNA Pre-anesthesia Checklist: Patient identified, Patient being monitored, Timeout performed, Emergency Drugs available and Suction available Patient Re-evaluated:Patient Re-evaluated prior to induction Oxygen Delivery Method: Circle system utilized Preoxygenation: Pre-oxygenation with 100% oxygen Induction Type: IV induction Ventilation: Mask ventilation without difficulty Laryngoscope Size: Mac and 3 Grade View: Grade I Tube type: Oral Tube size: 7.5 mm Number of attempts: 1 Airway Equipment and Method: Stylet Placement Confirmation: ETT inserted through vocal cords under direct vision, positive ETCO2 and breath sounds checked- equal and bilateral Secured at: 24 cm Tube secured with: Tape Dental Injury: Teeth and Oropharynx as per pre-operative assessment  Comments: Intubation by Theresa Mulligan, SRNA

## 2021-02-13 NOTE — Transfer of Care (Signed)
Immediate Anesthesia Transfer of Care Note  Patient: Serena Croissant  Procedure(s) Performed: XI ROBOTIC ASSISTED LAPAROSCOPIC CHOLECYSTECTOMY (Abdomen) INDOCYANINE GREEN FLUORESCENCE IMAGING (ICG)  Patient Location: PACU  Anesthesia Type:General  Level of Consciousness: drowsy  Airway & Oxygen Therapy: Patient connected to face mask oxygen  Post-op Assessment: Report given to RN and Post -op Vital signs reviewed and stable  Post vital signs: Reviewed and stable  Last Vitals:  Vitals Value Taken Time  BP 119/70 02/13/21 1215  Temp 36.4 C 02/13/21 1207  Pulse 90 02/13/21 1216  Resp 20 02/13/21 1216  SpO2 99 % 02/13/21 1216  Vitals shown include unvalidated device data.  Last Pain:  Vitals:   02/13/21 1207  TempSrc:   PainSc: 0-No pain         Complications: No notable events documented.

## 2021-02-13 NOTE — Anesthesia Postprocedure Evaluation (Signed)
Anesthesia Post Note  Patient: Latasha Alexander  Procedure(s) Performed: XI ROBOTIC ASSISTED LAPAROSCOPIC CHOLECYSTECTOMY (Abdomen) INDOCYANINE GREEN FLUORESCENCE IMAGING (ICG)  Patient location during evaluation: PACU Anesthesia Type: General Level of consciousness: awake and alert, awake and oriented Pain management: pain level controlled Vital Signs Assessment: post-procedure vital signs reviewed and stable Respiratory status: spontaneous breathing, nonlabored ventilation and respiratory function stable Cardiovascular status: blood pressure returned to baseline and stable Postop Assessment: no apparent nausea or vomiting Anesthetic complications: no   No notable events documented.   Last Vitals:  Vitals:   02/13/21 1315 02/13/21 1336  BP: 129/69 (!) 145/70  Pulse: 66 62  Resp: 18 16  Temp:  36.4 C  SpO2: 98% 96%    Last Pain:  Vitals:   02/13/21 1336  TempSrc: Temporal  PainSc: 4                  Manfred Arch

## 2021-02-13 NOTE — Op Note (Signed)
Preoperative diagnosis: Cholelithiasis  Postoperative diagnosis: Cholelithiasis  Procedure: Robotic Assisted Laparoscopic Cholecystectomy.   Anesthesia: GETA   Surgeon: Dr. Hazle Quant  Wound Classification: Clean Contaminated  Indications: Patient is a 30 y.o. female developed right upper quadrant pain, nausea and vomiting with meals and on workup was found to have cholelithiasis with a normal common duct. Robotic Assisted Laparoscopic cholecystectomy was elected.  Findings: Critical view of safety achieved   Cystic duct and artery identified, ligated and divided Adequate hemostasis  Description of procedure: The patient was placed on the operating table in the supine position. General anesthesia was induced. A time-out was completed verifying correct patient, procedure, site, positioning, and implant(s) and/or special equipment prior to beginning this procedure. An orogastric tube was placed. The abdomen was prepped and draped in the usual sterile fashion.  An incision was made in a natural skin line below the umbilicus.  The fascia was elevated and the Veress needle inserted. Proper position was confirmed by aspiration and saline meniscus test.  The abdomen was insufflated with carbon dioxide to a pressure of 15 mmHg. The patient tolerated insufflation well. A 8-mm trocar was then inserted in optiview fashion.  The laparoscope was inserted and the abdomen inspected. No injuries from initial trocar placement were noted. Additional trocars were then inserted in the following locations: an 8-mm trocar in the left lateral abdomen, and another two 8-mm trocars to the right side of the abdomen 5 cm appart. The umbilical trocar was changed to a 12 mm trocar all under direct visualization. The abdomen was inspected and no abnormalities were found. The table was placed in the reverse Trendelenburg position with the right side up. The robotic arms were docked and target anatomy identified.  Instrument inserted under direct visualization.  Filmy adhesions between the gallbladder and omentum, duodenum and transverse colon were lysed with electrocautery. The dome of the gallbladder was grasped with a prograsp and retracted over the dome of the liver. The infundibulum was also grasped with an atraumatic grasper and retracted toward the right lower quadrant. This maneuver exposed Calot's triangle. The peritoneum overlying the gallbladder infundibulum was then incised and the cystic duct and cystic artery identified and circumferentially dissected. Critical view of safety reviewed before ligating any structure. Firefly images taken to visualize biliary ducts. The cystic duct and cystic artery were then doubly clipped and divided close to the gallbladder.  The gallbladder was then dissected from its peritoneal attachments by electrocautery. Hemostasis was checked and the gallbladder and contained stones were removed using an endoscopic retrieval bag. The gallbladder was passed off the table as a specimen. There was no evidence of bleeding from the gallbladder fossa or cystic artery or leakage of the bile from the cystic duct stump. Secondary trocars were removed under direct vision. No bleeding was noted. The robotic arms were undoked. The scope was withdrawn and the umbilical trocar removed. The abdomen was allowed to collapse. The fascia of the 52mm trocar sites was closed with figure-of-eight 0 vicryl sutures. The skin was closed with subcuticular sutures of 4-0 monocryl and topical skin adhesive. The orogastric tube was removed.  The patient tolerated the procedure well and was taken to the postanesthesia care unit in stable condition.   Specimen: Gallbladder  Complications: None  EBL: 5 mL

## 2021-02-13 NOTE — Discharge Instructions (Addendum)
  Diet: Resume home heart healthy regular diet.   Activity: No heavy lifting >20 pounds (children, pets, laundry, garbage) or strenuous activity until follow-up, but light activity and walking are encouraged. Do not drive or drink alcohol if taking narcotic pain medications.  Wound care: May shower with soapy water and pat dry (do not rub incisions), but no baths or submerging incision underwater until follow-up. (no swimming)   Medications: Resume all home medications. For mild to moderate pain: acetaminophen (Tylenol) ***or ibuprofen (if no kidney disease). Combining Tylenol with alcohol can substantially increase your risk of causing liver disease. Narcotic pain medications, if prescribed, can be used for severe pain, though may cause nausea, constipation, and drowsiness. Do not combine Tylenol and Norco within a 6 hour period as Norco contains Tylenol. If you do not need the narcotic pain medication, you do not need to fill the prescription.  Call office ([phone]) at any time if any questions, worsening pain, fevers/chills, bleeding, drainage from incision site, or other concerns.   AMBULATORY SURGERY  DISCHARGE INSTRUCTIONS   The drugs that you were given will stay in your system until tomorrow so for the next 24 hours you should not:  Drive an automobile Make any legal decisions Drink any alcoholic beverage   You may resume regular meals tomorrow.  Today it is better to start with liquids and gradually work up to solid foods.  You may eat anything you prefer, but it is better to start with liquids, then soup and crackers, and gradually work up to solid foods.   Please notify your doctor immediately if you have any unusual bleeding, trouble breathing, redness and pain at the surgery site, drainage, fever, or pain not relieved by medication.    Additional Instructions:        Please contact your physician with any problems or Same Day Surgery at [phone], Monday  through Friday 6 am to 4 pm, or [facility] [place] [facility] number at [phone].  

## 2021-02-13 NOTE — Interval H&P Note (Signed)
History and Physical Interval Note:  02/13/2021 10:11 AM  Latasha Alexander  has presented today for surgery, with the diagnosis of K80.20 Calculus of gallbladder w/o cholecystitis or obstruction.  The various methods of treatment have been discussed with the patient and family. After consideration of risks, benefits and other options for treatment, the patient has consented to  Procedure(s): XI ROBOTIC ASSISTED LAPAROSCOPIC CHOLECYSTECTOMY (N/A) as a surgical intervention.  The patient's history has been reviewed, patient examined, no change in status, stable for surgery.  I have reviewed the patient's chart and labs.  Questions were answered to the patient's satisfaction.     Carolan Shiver

## 2021-02-14 LAB — SURGICAL PATHOLOGY

## 2021-03-04 ENCOUNTER — Ambulatory Visit
Admission: EM | Admit: 2021-03-04 | Discharge: 2021-03-04 | Disposition: A | Discharge status: Home / Self Care | Payer: Medicaid Other | Attending: Emergency Medicine | Admitting: Emergency Medicine

## 2021-03-04 ENCOUNTER — Other Ambulatory Visit: Payer: Self-pay

## 2021-03-04 ENCOUNTER — Encounter: Payer: Self-pay | Admitting: Emergency Medicine

## 2021-03-04 DIAGNOSIS — B372 Candidiasis of skin and nail: Secondary | ICD-10-CM

## 2021-03-04 MED ORDER — NYSTATIN 100000 UNIT/GM EX CREA: TOPICAL_CREAM | CUTANEOUS | 0 refills | Status: DC

## 2021-03-04 NOTE — ED Provider Notes (Signed)
[facility]    CSN: [id number] Arrival date & time: 03/04/21  0954      History   Chief Complaint Chief Complaint  Patient presents with   Covid Positive   Rash    HPI Latasha Alexander is a 30 y.o. female.   HPI  30 year old female here for evaluation of rash under her right breast.  Patient reports that she first noticed the rash yesterday.  She recently finished her 5 days quarantine and she is currently taking antivirals for COVID treatment.  She was concerned that it might be a drug reaction so she stopped antivirals.  She states that the area is red, tender, has an odor, and has a gritty coating.  It is also itchy.  She denies any rashes anywhere else on her body.  Her only other complaint is that she is having night sweats.  Past Medical History:  Diagnosis Date   Anxiety    COVID-19 2021   Depression    Diabetes mellitus without complication (HCC)    GERD (gastroesophageal reflux disease)    Heart murmur    Heartburn    Hypercholesterolemia    OCD (obsessive compulsive disorder)    Panic attacks    PONV (postoperative nausea and vomiting)    after cesarean    Patient Active Problem List   Diagnosis Date Noted   Breakthrough bleeding on depo provera 08/18/2020   OCD (obsessive compulsive disorder) 07/17/2016   Severe recurrent major depression without psychotic features (HCC) 07/16/2016   Pregnant 07/16/2016   Anxiety and depression 07/08/2016   Urinary symptom or sign 07/08/2016   Morbid obesity with BMI of 45.0-49.9, adult (HCC) 06/06/2016    Past Surgical History:  Procedure Laterality Date   CESAREAN SECTION     none      OB History     Gravida  2   Para      Term      Preterm      AB  1   Living  1      SAB      IAB      Ectopic      Multiple      Live Births               Home Medications    Prior to Admission medications   Medication Sig Start Date End Date Taking? Authorizing Provider   ARIPiprazole (ABILIFY) 10 MG tablet Take 10 mg by mouth daily.   Yes [provider]  nystatin cream (MYCOSTATIN) Apply to affected area 2 times daily 03/04/21  Yes Becky Augusta, NP  paragard intrauterine copper IUD IUD 1 each by Intrauterine route once.   Yes [provider]  sertraline (ZOLOFT) 100 MG tablet Take 200 mg by mouth in the morning and at bedtime.   Yes [provider]  fluticasone (FLONASE) 50 MCG/ACT nasal spray Place 2 sprays into both nostrils daily. Patient not taking: No sig reported 08/09/16 12/03/19  Domenick Gong, MD  LATUDA 40 MG TABS tablet TAKE 1 TABLET BY MOUTH EVERY DAY WITH SUPPER Patient not taking: No sig reported 08/20/16 12/03/19  Hildred Laser, MD  medroxyPROGESTERone (DEPO-PROVERA) 150 MG/ML injection  06/20/20 09/12/20  [provider]  promethazine (PHENERGAN) 12.5 MG tablet Take 1 tablet (12.5 mg total) by mouth every 6 (six) hours as needed for nausea or vomiting. Patient not taking: Reported on 04/29/2019 07/05/16 12/03/19  Hildred Laser, MD    Family History Family History  Problem Relation Age of Onset   Diabetes Mother    Hypertension Mother    Hypertension Sister    Stroke Maternal Grandfather     Social History Social History   Tobacco Use   Smoking status: Former    Packs/day: 0.25    Types: Cigarettes   Smokeless tobacco: Never  Vaping Use   Vaping Use: Every day   Substances: Nicotine, Flavoring  Substance Use Topics   Alcohol use: No    Alcohol/week: 0.0 standard drinks   Drug use: No     Allergies   Cashew nut oil   Review of Systems Review of Systems  Constitutional:  Positive for diaphoresis. Negative for activity change, appetite change and fever.  Skin:  Positive for color change and rash.    Physical Exam Triage Vital Signs ED Triage Vitals  Enc Vitals Group     BP 03/04/21 1049 121/78     Pulse Rate 03/04/21 1049 71     Resp 03/04/21 1049 14     Temp 03/04/21 1049 98.2 F  (36.8 C)     Temp Source 03/04/21 1049 Oral     SpO2 03/04/21 1049 100 %     Weight 03/04/21 1046 (!) 345 lb (156.5 kg)     Height 03/04/21 1046 5\' 4"  (1.626 m)     Head Circumference --      Peak Flow --      Pain Score 03/04/21 1046 0     Pain Loc --      Pain Edu? --      Excl. in [place]? --    No data found.  Updated Vital Signs BP 121/78 (BP Location: Right Arm)   Pulse 71   Temp 98.2 F (36.8 C) (Oral)   Resp 14   Ht 5\' 4"  (1.626 m)   Wt (!) 345 lb (156.5 kg)   LMP 02/05/2021 (Approximate)   SpO2 100%   BMI 59.22 kg/m   Visual Acuity Right Eye Distance:   Left Eye Distance:   Bilateral Distance:    Right Eye Near:   Left Eye Near:    Bilateral Near:     Physical Exam Vitals and nursing note reviewed.  Constitutional:      General: She is not in acute distress.    Appearance: Normal appearance. She is not ill-appearing.  HENT:     Head: Normocephalic and atraumatic.  Skin:    General: Skin is warm.     Capillary Refill: Capillary refill takes less than 2 seconds.     Findings: Erythema and rash present.  Neurological:     General: No focal deficit present.     Mental Status: She is alert and oriented to person, place, and time.  Psychiatric:        Mood and Affect: Mood normal.        Behavior: Behavior normal.        Thought Content: Thought content normal.        Judgment: Judgment normal.     UC Treatments / Results  Labs (all labs ordered are listed, but only abnormal results are displayed) Labs Reviewed - No data to display  EKG   Radiology No results found.  Procedures Procedures (including critical care time)  Medications Ordered in UC Medications - No data to display  Initial Impression / Assessment and Plan / UC Course  I have reviewed the triage vital signs and the nursing notes.  Pertinent labs & imaging results that  were available during my care of the patient were reviewed by me and considered in my medical decision making  (see chart for details).  Is a nontoxic-appearing 30 year old female here for evaluation of a rash with her right breast that she first noticed yesterday.  The rash is red, tender, itchy, has an odor and agrees to coating.  As stated in the HPI above, patient states that she thought it was a reaction to the antiviral she is taking for her COVID symptoms so she stopped taking them.  She is also concerned because she is having night sweats.  I explained to the patient that night sweats is a side effect of the antiviral therapy as well as COVID.  Patient's physical exam reveals erythematous, raised, red patch underneath the right breast and to a lesser degree under the left breast that does have a greasy coating and is warm but not hot.  There is no induration or fluctuance.  Patient's exam is consistent with a skin yeast infection.  We will treat with topical nystatin ointment twice daily until the rash is resolved.  I have also advised the patient to wear a bra as little as possible.  To wear a cotton T-shirt to allow bruisability and airflow to get to the rash to help dry it up.  Of also advised her that she can put some gauze pads underneath both breast to separate the skin and allow airflow to help the yeast infection resolve sooner.   Final Clinical Impressions(s) / UC Diagnoses   Final diagnoses:  Skin candidiasis     Discharge Instructions      Apply the Nystatin twice daily to the rash under your breasts until the rash is resolved.  Avoid wearing a bras as much as possible and wear a thin cotton t-shirt to allow air flow to dry out the yeast infection.  You can also apply gauze pads under your breasts to open up a channel for airflow to help resolve the infection.  Return for re-evaluation of new or worsening symptoms.     ED Prescriptions     Medication Sig Dispense Auth. Provider   nystatin cream (MYCOSTATIN) Apply to affected area 2 times daily 30 g Becky Augusta, NP       PDMP not reviewed this encounter.   Becky Augusta, NP 03/04/21 1200

## 2021-03-04 NOTE — ED Triage Notes (Signed)
Patient reports rash and itching under her right breast that started yesterday.  Patient is COVID + and finished her 5 day quarintine.  Patient denies fevers.  Patient has been having night sweats.

## 2021-03-04 NOTE — Discharge Instructions (Addendum)
Apply the Nystatin twice daily to the rash under your breasts until the rash is resolved.  Avoid wearing a bras as much as possible and wear a thin cotton t-shirt to allow air flow to dry out the yeast infection.  You can also apply gauze pads under your breasts to open up a channel for airflow to help resolve the infection.  Return for re-evaluation of new or worsening symptoms.

## 2021-04-10 ENCOUNTER — Ambulatory Visit: Admit: 2021-04-10 | Payer: Medicaid Other

## 2021-04-29 ENCOUNTER — Ambulatory Visit: Admit: 2021-04-29 | Discharge status: Absent without leave | Payer: Medicaid Other

## 2021-04-30 ENCOUNTER — Encounter: Payer: Self-pay | Admitting: Emergency Medicine

## 2021-04-30 ENCOUNTER — Other Ambulatory Visit: Payer: Self-pay

## 2021-04-30 ENCOUNTER — Ambulatory Visit
Admission: EM | Admit: 2021-04-30 | Discharge: 2021-04-30 | Disposition: A | Discharge status: Home / Self Care | Payer: Medicaid Other | Attending: Internal Medicine | Admitting: Internal Medicine

## 2021-04-30 DIAGNOSIS — B9789 Other viral agents as the cause of diseases classified elsewhere: Secondary | ICD-10-CM | POA: Insufficient documentation

## 2021-04-30 DIAGNOSIS — J019 Acute sinusitis, unspecified: Secondary | ICD-10-CM | POA: Diagnosis present

## 2021-04-30 LAB — GROUP A STREP BY PCR: Group A Strep by PCR: NOT DETECTED

## 2021-04-30 LAB — RAPID INFLUENZA A&B ANTIGENS
Influenza A (ARMC): NEGATIVE
Influenza B (ARMC): NEGATIVE

## 2021-04-30 MED ORDER — IPRATROPIUM BROMIDE 0.03 % NA SOLN: 2.0000 | Freq: Two times a day (BID) | NASAL | 12 refills | Status: DC

## 2021-04-30 MED ORDER — FLUTICASONE PROPIONATE 50 MCG/ACT NA SUSP: 1.0000 | Freq: Every day | NASAL | 0 refills | Status: DC

## 2021-04-30 NOTE — ED Triage Notes (Signed)
Patient c/o stuffy nose, nasal congestion, and sinus congestion that started 2 days ago.  Patient states that she had COVID a month ago.  Patient unsure of fevers.  Patient reports chills and bodyaches that started yesterday.

## 2021-04-30 NOTE — ED Provider Notes (Signed)
[facility]    CSN: [id number] Arrival date & time: 04/30/21  3536      History   Chief Complaint Chief Complaint  Patient presents with   Nasal Congestion   Headache    HPI Latasha Alexander is a 30 y.o. female comes to the urgent care with a 2-day history of nasal congestion, yellowish nasal discharge, sore throat and right ear pain.  Symptoms started couple days ago and has been persistent.  Patient denies any fever or chills.  No sick contacts.  She is vaccinated against COVID.  No nausea, vomiting or diarrhea.  No shortness of breath or wheezing.  No ringing in the ears and no ear discharge.  Mild body aches. Marland Kitchen   HPI  Past Medical History:  Diagnosis Date   Anxiety    COVID-19 2021   Depression    Diabetes mellitus without complication (HCC)    GERD (gastroesophageal reflux disease)    Heart murmur    Heartburn    Hypercholesterolemia    OCD (obsessive compulsive disorder)    Panic attacks    PONV (postoperative nausea and vomiting)    after cesarean    Patient Active Problem List   Diagnosis Date Noted   Breakthrough bleeding on depo provera 08/18/2020   OCD (obsessive compulsive disorder) 07/17/2016   Severe recurrent major depression without psychotic features (HCC) 07/16/2016   Pregnant 07/16/2016   Anxiety and depression 07/08/2016   Urinary symptom or sign 07/08/2016   Morbid obesity with BMI of 45.0-49.9, adult (HCC) 06/06/2016    Past Surgical History:  Procedure Laterality Date   CESAREAN SECTION     none      OB History     Gravida  2   Para      Term      Preterm      AB  1   Living  1      SAB      IAB      Ectopic      Multiple      Live Births               Home Medications    Prior to Admission medications   Medication Sig Start Date End Date Taking? Authorizing Provider  ARIPiprazole (ABILIFY) 10 MG tablet Take 10 mg by mouth daily.   Yes [provider]  fluticasone (FLONASE) 50  MCG/ACT nasal spray Place 1 spray into both nostrils daily. 04/30/21  Yes Guerline Happ, Britta Mccreedy, MD  ipratropium (ATROVENT) 0.03 % nasal spray Place 2 sprays into both nostrils every 12 (twelve) hours. 04/30/21  Yes Heaton Sarin, Britta Mccreedy, MD  paragard intrauterine copper IUD IUD 1 each by Intrauterine route once.   Yes [provider]  sertraline (ZOLOFT) 100 MG tablet Take 200 mg by mouth in the morning and at bedtime.   Yes [provider]  nystatin cream (MYCOSTATIN) Apply to affected area 2 times daily 03/04/21   Becky Augusta, NP  LATUDA 40 MG TABS tablet TAKE 1 TABLET BY MOUTH EVERY DAY WITH SUPPER Patient not taking: No sig reported 08/20/16 12/03/19  Hildred Laser, MD  medroxyPROGESTERone (DEPO-PROVERA) 150 MG/ML injection  06/20/20 09/12/20  [provider]  promethazine (PHENERGAN) 12.5 MG tablet Take 1 tablet (12.5 mg total) by mouth every 6 (six) hours as needed for nausea or vomiting. Patient not taking: Reported on 04/29/2019 07/05/16 12/03/19  Hildred Laser, MD    Family History Family History  Problem Relation  Age of Onset   Diabetes Mother    Hypertension Mother    Hypertension Sister    Stroke Maternal Grandfather     Social History Social History   Tobacco Use   Smoking status: Former    Packs/day: 0.25    Types: Cigarettes   Smokeless tobacco: Never  Vaping Use   Vaping Use: Every day   Substances: Nicotine, Flavoring  Substance Use Topics   Alcohol use: No    Alcohol/week: 0.0 standard drinks   Drug use: No     Allergies   Cashew nut oil   Review of Systems Review of Systems  Constitutional: Negative.   HENT:  Positive for congestion, postnasal drip and sore throat. Negative for sinus pressure, sinus pain and sneezing.   Respiratory:  Positive for cough. Negative for shortness of breath, wheezing and stridor.   Cardiovascular: Negative.   Gastrointestinal:  Negative for abdominal pain.  Neurological: Negative.     Physical  Exam Triage Vital Signs ED Triage Vitals  Enc Vitals Group     BP 04/30/21 0930 137/90     Pulse Rate 04/30/21 0930 75     Resp 04/30/21 0930 16     Temp 04/30/21 0930 98 F (36.7 C)     Temp Source 04/30/21 0930 Oral     SpO2 04/30/21 0930 100 %     Weight 04/30/21 0926 (!) 342 lb (155.1 kg)     Height 04/30/21 0926 5\' 4"  (1.626 m)     Head Circumference --      Peak Flow --      Pain Score 04/30/21 0926 8     Pain Loc --      Pain Edu? --      Excl. in [place]? --    No data found.  Updated Vital Signs BP 137/90 (BP Location: Left Arm)   Pulse 75   Temp 98 F (36.7 C) (Oral)   Resp 16   Ht 5\' 4"  (1.626 m)   Wt (!) 155.1 kg   SpO2 100%   BMI 58.70 kg/m   Visual Acuity Right Eye Distance:   Left Eye Distance:   Bilateral Distance:    Right Eye Near:   Left Eye Near:    Bilateral Near:     Physical Exam Vitals and nursing note reviewed.  Constitutional:      Appearance: She is not ill-appearing.  Cardiovascular:     Rate and Rhythm: Normal rate and regular rhythm.     Heart sounds: Normal heart sounds.  Pulmonary:     Effort: Pulmonary effort is normal.     Breath sounds: Normal breath sounds.  Musculoskeletal:     Cervical back: Neck supple.  Neurological:     Mental Status: She is alert.     GCS: GCS eye subscore is 4. GCS verbal subscore is 5. GCS motor subscore is 6.     UC Treatments / Results  Labs (all labs ordered are listed, but only abnormal results are displayed) Labs Reviewed  GROUP A STREP BY PCR  RAPID INFLUENZA A&B ANTIGENS    EKG   Radiology No results found.  Procedures Procedures (including critical care time)  Medications Ordered in UC Medications - No data to display  Initial Impression / Assessment and Plan / UC Course  I have reviewed the triage vital signs and the nursing notes.  Pertinent labs & imaging results that were available during my care of the patient were reviewed by me  and considered in my medical  decision making (see chart for details).     1.  Acute viral sinusitis: Nasal spray-fluticasone nasal spray in the morning and ipratropium nasal spray at night Warm salt water gargle Maintain adequate hydration Return to urgent care if symptoms worsen Flu and strep tests are negative   Final Clinical Impressions(s) / UC Diagnoses   Final diagnoses:  Acute viral sinusitis     Discharge Instructions      Please use nasal spray as prescribed Warm salt water gargle Tylenol/Motrin as needed for pain and/or fever Return to urgent care if symptoms worsen      ED Prescriptions     Medication Sig Dispense Auth. Provider   fluticasone (FLONASE) 50 MCG/ACT nasal spray Place 1 spray into both nostrils daily. 16 g Chase Picket, MD   ipratropium (ATROVENT) 0.03 % nasal spray Place 2 sprays into both nostrils every 12 (twelve) hours. 30 mL Kyrie Fludd, Myrene Galas, MD      PDMP not reviewed this encounter.   Chase Picket, MD 04/30/21 1054

## 2021-04-30 NOTE — Discharge Instructions (Signed)
Please use nasal spray as prescribed Warm salt water gargle Tylenol/Motrin as needed for pain and/or fever Return to urgent care if symptoms worsen

## 2021-05-25 ENCOUNTER — Other Ambulatory Visit: Payer: Self-pay

## 2021-05-25 ENCOUNTER — Ambulatory Visit
Admission: EM | Admit: 2021-05-25 | Discharge: 2021-05-25 | Disposition: A | Discharge status: Home / Self Care | Payer: Medicaid Other | Attending: Internal Medicine | Admitting: Internal Medicine

## 2021-05-25 ENCOUNTER — Encounter: Payer: Self-pay | Admitting: Licensed Clinical Social Worker

## 2021-05-25 ENCOUNTER — Ambulatory Visit: Admit: 2021-05-25 | Payer: Self-pay

## 2021-05-25 DIAGNOSIS — J0100 Acute maxillary sinusitis, unspecified: Secondary | ICD-10-CM | POA: Diagnosis present

## 2021-05-25 DIAGNOSIS — Z20822 Contact with and (suspected) exposure to covid-19: Secondary | ICD-10-CM | POA: Diagnosis not present

## 2021-05-25 DIAGNOSIS — J039 Acute tonsillitis, unspecified: Secondary | ICD-10-CM | POA: Insufficient documentation

## 2021-05-25 DIAGNOSIS — Z6841 Body Mass Index (BMI) 40.0 and over, adult: Secondary | ICD-10-CM | POA: Diagnosis not present

## 2021-05-25 DIAGNOSIS — E669 Obesity, unspecified: Secondary | ICD-10-CM | POA: Diagnosis not present

## 2021-05-25 LAB — RESP PANEL BY RT-PCR (FLU A&B, COVID) ARPGX2
Influenza A by PCR: NEGATIVE
Influenza B by PCR: NEGATIVE
SARS Coronavirus 2 by RT PCR: NEGATIVE

## 2021-05-25 MED ORDER — AMOXICILLIN-POT CLAVULANATE 875-125 MG PO TABS: 1.0000 | ORAL_TABLET | Freq: Two times a day (BID) | ORAL | 0 refills | Status: DC

## 2021-05-25 NOTE — ED Triage Notes (Signed)
Pt c/o body aches, headache, sore throat, facial pain. Sxs started today.

## 2021-05-25 NOTE — ED Provider Notes (Signed)
[facility]    CSN: [id number] Arrival date & time: 05/25/21  1805      History   Chief Complaint Chief Complaint  Patient presents with   Nasal Congestion   Sore Throat   Facial Pain    HPI Latasha Alexander is a 30 y.o. female who presents with body aches, ST, face pain that started today. She has been sick off and on with colds x 4 in the past 4 months. The last time was 2 weeks ago and was better for 4 days, til today. Is very stuffy and started having a fever this pm up to 101 before she came. She took something for her fever.     Past Medical History:  Diagnosis Date   Anxiety    COVID-19 2021   Depression    Diabetes mellitus without complication (HCC)    GERD (gastroesophageal reflux disease)    Heart murmur    Heartburn    Hypercholesterolemia    OCD (obsessive compulsive disorder)    Panic attacks    PONV (postoperative nausea and vomiting)    after cesarean    Patient Active Problem List   Diagnosis Date Noted   Breakthrough bleeding on depo provera 08/18/2020   OCD (obsessive compulsive disorder) 07/17/2016   Severe recurrent major depression without psychotic features ([place]) 07/16/2016   Pregnant 07/16/2016   Anxiety and depression 07/08/2016   Urinary symptom or sign 07/08/2016   Morbid obesity with BMI of 45.0-49.9, adult ([place]) 06/06/2016    Past Surgical History:  Procedure Laterality Date   CESAREAN SECTION     none      OB History     Gravida  2   Para      Term      Preterm      AB  1   Living  1      SAB      IAB      Ectopic      Multiple      Live Births               Home Medications    Prior to Admission medications   Medication Sig Start Date End Date Taking? Authorizing Provider  amoxicillin-clavulanate (AUGMENTIN) 875-125 MG tablet Take 1 tablet by mouth every 12 (twelve) hours. 05/25/21  Yes Rodriguez-Southworth, Sunday Spillers, PA-C  ARIPiprazole (ABILIFY) 10 MG tablet Take 10 mg by mouth  daily.   Yes [provider]  fluticasone (FLONASE) 50 MCG/ACT nasal spray Place 1 spray into both nostrils daily. 04/30/21  Yes Lamptey, Myrene Galas, MD  ipratropium (ATROVENT) 0.03 % nasal spray Place 2 sprays into both nostrils every 12 (twelve) hours. 04/30/21  Yes Lamptey, Myrene Galas, MD  paragard intrauterine copper IUD IUD 1 each by Intrauterine route once.   Yes [provider]  sertraline (ZOLOFT) 100 MG tablet Take 200 mg by mouth in the morning and at bedtime.   Yes [provider]  LATUDA 40 MG TABS tablet TAKE 1 TABLET BY MOUTH EVERY DAY WITH SUPPER Patient not taking: No sig reported 08/20/16 12/03/19  Rubie Maid, MD  medroxyPROGESTERone (DEPO-PROVERA) 150 MG/ML injection  06/20/20 09/12/20  [provider]  promethazine (PHENERGAN) 12.5 MG tablet Take 1 tablet (12.5 mg total) by mouth every 6 (six) hours as needed for nausea or vomiting. Patient not taking: Reported on 04/29/2019 07/05/16 12/03/19  Rubie Maid, MD    Family History Family History  Problem Relation Age of Onset  Diabetes Mother    Hypertension Mother    Hypertension Sister    Stroke Maternal Grandfather     Social History Social History   Tobacco Use   Smoking status: Former    Packs/day: 0.25    Types: Cigarettes   Smokeless tobacco: Never  Vaping Use   Vaping Use: Every day   Substances: Nicotine, Flavoring  Substance Use Topics   Alcohol use: No    Alcohol/week: 0.0 standard drinks   Drug use: No     Allergies   Cashew nut oil   Review of Systems Review of Systems  Constitutional:  Positive for chills and fever. Negative for appetite change.  HENT:  Positive for congestion, sinus pressure, sinus pain and sore throat. Negative for ear discharge, ear pain and trouble swallowing.   Eyes:  Negative for discharge.  Respiratory:  Positive for cough. Negative for chest tightness.   Cardiovascular:  Negative for chest pain.  Musculoskeletal:  Positive for  myalgias. Negative for gait problem.  Skin:  Negative for rash.  Neurological:  Positive for headaches.  Hematological:  Negative for adenopathy.    Physical Exam Triage Vital Signs ED Triage Vitals  Enc Vitals Group     BP 05/25/21 1837 (!) 136/95     Pulse Rate 05/25/21 1837 97     Resp 05/25/21 1837 18     Temp 05/25/21 1837 98.8 F (37.1 C)     Temp Source 05/25/21 1837 Oral     SpO2 05/25/21 1837 100 %     Weight 05/25/21 1836 (!) 341 lb 11.4 oz (155 kg)     Height 05/25/21 1836 5\' 4"  (1.626 m)     Head Circumference --      Peak Flow --      Pain Score 05/25/21 1836 10     Pain Loc --      Pain Edu? --      Excl. in [place]? --    No data found.  Updated Vital Signs BP (!) 136/95 (BP Location: Left Wrist)   Pulse 97   Temp 98.8 F (37.1 C) (Oral)   Resp 18   Ht 5\' 4"  (1.626 m)   Wt (!) 341 lb 11.4 oz (155 kg)   LMP 05/18/2021   SpO2 100%   BMI 58.65 kg/m   Visual Acuity Right Eye Distance:   Left Eye Distance:   Bilateral Distance:    Right Eye Near:   Left Eye Near:    Bilateral Near:     Physical Exam Constitutional:      General: She is not in acute distress.    Appearance: She is obese. She is ill-appearing. She is not toxic-appearing or diaphoretic.     Comments: She breaths heavily through her mouth.  HENT:     Right Ear: Tympanic membrane and ear canal normal.     Left Ear: Tympanic membrane and ear canal normal.     Nose: Congestion present.     Comments: The R side is completely closed.  Her maxillary sinuses are very tender    Mouth/Throat:     Mouth: Mucous membranes are moist.     Pharynx: Uvula midline.     Tonsils: No tonsillar exudate. 3+ on the right. 2+ on the left.  Eyes:     Conjunctiva/sclera: Conjunctivae normal.  Cardiovascular:     Rate and Rhythm: Normal rate and regular rhythm.     Heart sounds: Normal heart sounds.  Pulmonary:  Effort: Pulmonary effort is normal.     Breath sounds: Normal breath sounds.   Musculoskeletal:     Cervical back: Neck supple.  Skin:    General: Skin is warm and dry.     Findings: No rash.  Neurological:     Mental Status: She is alert and oriented to person, place, and time.     UC Treatments / Results  Labs (all labs ordered are listed, but only abnormal results are displayed) Labs Reviewed  RESP PANEL BY RT-PCR (FLU A&B, COVID) [id number]  Respiratory panel is neg  EKG   Radiology No results found.  Procedures Procedures (including critical care time)  Medications Ordered in UC Medications - No data to display  Initial Impression / Assessment and Plan / UC Course  I have reviewed the triage vital signs and the nursing notes. Pertinent labs results that were available during my care of the patient were reviewed by me and considered in my medical decision making (see chart for details).  Maxillary sinusitis and tonsillitis. I placed her on Augmentin as noted. Advised to use her nasal sprays she has at home x 7 days  Final Clinical Impressions(s) / UC Diagnoses   Final diagnoses:  Acute non-recurrent maxillary sinusitis  Acute tonsillitis, unspecified etiology   Discharge Instructions   None    ED Prescriptions     Medication Sig Dispense Auth. Provider   amoxicillin-clavulanate (AUGMENTIN) 875-125 MG tablet Take 1 tablet by mouth every 12 (twelve) hours. 14 tablet Rodriguez-Southworth, Nettie Elm, PA-C      PDMP not reviewed this encounter.   Garey Ham, [place] 05/25/21 1947

## 2021-05-27 ENCOUNTER — Other Ambulatory Visit: Payer: Self-pay

## 2021-05-27 ENCOUNTER — Emergency Department
Admission: EM | Admit: 2021-05-27 | Discharge: 2021-05-27 | Disposition: A | Discharge status: Home / Self Care | Payer: Medicaid Other | Attending: Emergency Medicine | Admitting: Emergency Medicine

## 2021-05-27 ENCOUNTER — Emergency Department: Payer: Medicaid Other

## 2021-05-27 DIAGNOSIS — Z8616 Personal history of COVID-19: Secondary | ICD-10-CM | POA: Diagnosis not present

## 2021-05-27 DIAGNOSIS — E119 Type 2 diabetes mellitus without complications: Secondary | ICD-10-CM | POA: Insufficient documentation

## 2021-05-27 DIAGNOSIS — J029 Acute pharyngitis, unspecified: Secondary | ICD-10-CM | POA: Diagnosis present

## 2021-05-27 DIAGNOSIS — J039 Acute tonsillitis, unspecified: Secondary | ICD-10-CM | POA: Diagnosis not present

## 2021-05-27 DIAGNOSIS — Z87891 Personal history of nicotine dependence: Secondary | ICD-10-CM | POA: Diagnosis not present

## 2021-05-27 LAB — BASIC METABOLIC PANEL
Anion gap: 7 (ref 5–15)
BUN: 8 mg/dL (ref 6–20)
CO2: 27 mmol/L (ref 22–32)
Calcium: 8.9 mg/dL (ref 8.9–10.3)
Chloride: 102 mmol/L (ref 98–111)
Creatinine, Ser: 0.58 mg/dL (ref 0.44–1.00)
GFR, Estimated: >60 mL/min (ref >60)
Glucose, Bld: 138 mg/dL — ABNORMAL HIGH (ref 70–99)
Potassium: 4.2 mmol/L (ref 3.5–5.1)
Sodium: 136 mmol/L (ref 135–145)

## 2021-05-27 LAB — CBC
HCT: 41.2 % (ref 36.0–46.0)
Hemoglobin: 13.3 g/dL (ref 12.0–15.0)
MCH: 30.2 pg (ref 26.0–34.0)
MCHC: 32.3 g/dL (ref 30.0–36.0)
MCV: 93.4 fL (ref 80.0–100.0)
Platelets: 351 10*3/uL (ref 150–400)
RBC: 4.41 MIL/uL (ref 3.87–5.11)
RDW: 13.2 % (ref 11.5–15.5)
WBC: 10.6 10*3/uL — ABNORMAL HIGH (ref 4.0–10.5)
nRBC: 0 % (ref 0.0–0.2)

## 2021-05-27 MED ORDER — KETOROLAC TROMETHAMINE 30 MG/ML IJ SOLN
15.0000 mg | INTRAMUSCULAR | Status: AC
  Administered 2021-05-27: 15 mg via INTRAVENOUS
  Filled 2021-05-27: qty 1

## 2021-05-27 MED ORDER — SODIUM CHLORIDE 0.9 % IV BOLUS
500.0000 mL | Freq: Once | INTRAVENOUS | Status: AC
  Administered 2021-05-27: 500 mL via INTRAVENOUS

## 2021-05-27 MED ORDER — DEXAMETHASONE SODIUM PHOSPHATE 10 MG/ML IJ SOLN
10.0000 mg | Freq: Once | INTRAMUSCULAR | Status: AC
  Administered 2021-05-27: 10 mg via INTRAVENOUS
  Filled 2021-05-27: qty 1

## 2021-05-27 MED ORDER — IOHEXOL 300 MG/ML  SOLN
80.0000 mL | Freq: Once | INTRAMUSCULAR | Status: AC | PRN
  Administered 2021-05-27: 80 mL via INTRAVENOUS

## 2021-05-27 NOTE — Discharge Instructions (Signed)
Your CT scan today shows there is no abscess in your tonsil.  We gave you steroids and anti-inflammatory medicine which should help decrease the swelling and pain in her throat.  Continue taking her ibuprofen 600 mg max 6 hours for pain control and continue taking the Augmentin until completed.

## 2021-05-27 NOTE — ED Triage Notes (Signed)
Pt reports sent to ED for possible peritonsillar abscess. Currently on antibiotics for sinus infection

## 2021-05-27 NOTE — ED Provider Notes (Signed)
[facility] Emergency Department Provider Note  ____________________________________________  Time seen: Approximately 6:48 PM  I have reviewed the triage vital signs and the nursing notes.   HISTORY  Chief Complaint Abscess    HPI Latasha Alexander is a 30 y.o. female with a history of diabetes, hyperlipidemia recently started on Augmentin 2 days ago for sinusitis who comes ED complaining of worsening sore throat.  She did she was recently diagnosed with tonsillitis.  She was reassessed by primary care today who told her that it looked like she had a tonsillar abscess and needed to come to the emergency department.  Denies fever.  Denies trouble breathing.    Past Medical History:  Diagnosis Date   Anxiety    COVID-19 2021   Depression    Diabetes mellitus without complication (HCC)    GERD (gastroesophageal reflux disease)    Heart murmur    Heartburn    Hypercholesterolemia    OCD (obsessive compulsive disorder)    Panic attacks    PONV (postoperative nausea and vomiting)    after cesarean     Patient Active Problem List   Diagnosis Date Noted   Breakthrough bleeding on depo provera 08/18/2020   OCD (obsessive compulsive disorder) 07/17/2016   Severe recurrent major depression without psychotic features (HCC) 07/16/2016   Pregnant 07/16/2016   Anxiety and depression 07/08/2016   Urinary symptom or sign 07/08/2016   Morbid obesity with BMI of 45.0-49.9, adult (HCC) 06/06/2016     Past Surgical History:  Procedure Laterality Date   CESAREAN SECTION     none       Prior to Admission medications   Medication Sig Start Date End Date Taking? Authorizing Provider  amoxicillin-clavulanate (AUGMENTIN) 875-125 MG tablet Take 1 tablet by mouth every 12 (twelve) hours. 05/25/21   Rodriguez-Southworth, Nettie Elm, PA-C  ARIPiprazole (ABILIFY) 10 MG tablet Take 10 mg by mouth daily.    [provider]  fluticasone (FLONASE) 50 MCG/ACT  nasal spray Place 1 spray into both nostrils daily. 04/30/21   Merrilee Jansky, MD  ipratropium (ATROVENT) 0.03 % nasal spray Place 2 sprays into both nostrils every 12 (twelve) hours. 04/30/21   LampteyBritta Mccreedy, MD  paragard intrauterine copper IUD IUD 1 each by Intrauterine route once.    [provider]  sertraline (ZOLOFT) 100 MG tablet Take 200 mg by mouth in the morning and at bedtime.    [provider]  LATUDA 40 MG TABS tablet TAKE 1 TABLET BY MOUTH EVERY DAY WITH SUPPER Patient not taking: No sig reported 08/20/16 12/03/19  Hildred Laser, MD  medroxyPROGESTERone (DEPO-PROVERA) 150 MG/ML injection  06/20/20 09/12/20  [provider]  promethazine (PHENERGAN) 12.5 MG tablet Take 1 tablet (12.5 mg total) by mouth every 6 (six) hours as needed for nausea or vomiting. Patient not taking: Reported on 04/29/2019 07/05/16 12/03/19  Hildred Laser, MD     Allergies Cashew nut oil   Family History  Problem Relation Age of Onset   Diabetes Mother    Hypertension Mother    Hypertension Sister    Stroke Maternal Grandfather     Social History Social History   Tobacco Use   Smoking status: Former    Packs/day: 0.25    Types: Cigarettes   Smokeless tobacco: Never  Vaping Use   Vaping Use: Every day   Substances: Nicotine, Flavoring  Substance Use Topics   Alcohol use: No    Alcohol/week: 0.0 standard drinks   Drug  use: No    Review of Systems  Constitutional:   No fever or chills.  ENT:   Positive sore throat. No rhinorrhea. Cardiovascular:   No chest pain or syncope. Respiratory:   No dyspnea or cough. Gastrointestinal:   Negative for abdominal pain, vomiting and diarrhea.  Musculoskeletal:   Negative for focal pain or swelling All other systems reviewed and are negative except as documented above in ROS and HPI.  ____________________________________________   PHYSICAL EXAM:  VITAL SIGNS: ED Triage Vitals  Enc Vitals Group     BP 05/27/21  1641 (!) 146/86     Pulse Rate 05/27/21 1641 80     Resp 05/27/21 1641 20     Temp 05/27/21 1641 98.4 F (36.9 C)     Temp Source 05/27/21 1641 Oral     SpO2 05/27/21 1641 100 %     Weight 05/27/21 1642 (!) 341 lb 11.4 oz (155 kg)     Height 05/27/21 1642 5\' 4"  (1.626 m)     Head Circumference --      Peak Flow --      Pain Score 05/27/21 1642 8     Pain Loc --      Pain Edu? --      Excl. in [place]? --     Vital signs reviewed, nursing assessments reviewed.   Constitutional:   Alert and oriented. Non-toxic appearance. Eyes:   Conjunctivae are normal. EOMI. PERRL. ENT      Head:   Normocephalic and atraumatic.      Nose:   Normal.      Mouth/Throat:   Bilateral tonsillitis, asymmetric enlarged right tonsil which abuts the uvula.  Patient is managing secretions comfortably.      Neck:   No meningismus. Full ROM.  There is fullness of submandibular neck tenderness. Hematological/Lymphatic/Immunilogical:   Positive cervical lymphadenopathy. Cardiovascular:   RRR. Symmetric bilateral radial and DP pulses.  No murmurs. Cap refill less than 2 seconds. Respiratory:   Normal respiratory effort without tachypnea/retractions. Breath sounds are clear and equal bilaterally. No wheezes/rales/rhonchi. Gastrointestinal:   Soft and nontender. Non distended. There is no CVA tenderness.  No rebound, rigidity, or guarding. Genitourinary:   deferred Musculoskeletal:   Normal range of motion in all extremities. No joint effusions.  No lower extremity tenderness.  No edema. Neurologic:   Normal speech and language.  Motor grossly intact. No acute focal neurologic deficits are appreciated.  Skin:    Skin is warm, dry and intact. No rash noted.  No petechiae, purpura, or bullae.  ____________________________________________    LABS (pertinent positives/negatives) (all labs ordered are listed, but only abnormal results are displayed) Labs Reviewed  CBC - Abnormal; Notable for the following  components:      Result Value   WBC 10.6 (*)    All other components within normal limits  BASIC METABOLIC PANEL - Abnormal; Notable for the following components:   Glucose, Bld 138 (*)    All other components within normal limits   ____________________________________________   EKG    ____________________________________________    RADIOLOGY  CT Soft Tissue Neck W Contrast  Result Date: 05/27/2021 CLINICAL DATA:  Epiglottitis or tonsillitis suspected, PTA suspected EXAM: CT NECK WITH CONTRAST TECHNIQUE: Multidetector CT imaging of the neck was performed using the standard protocol following the bolus administration of intravenous contrast. CONTRAST:  67mL OMNIPAQUE IOHEXOL 300 MG/ML  SOLN COMPARISON:  None. FINDINGS: Pharynx and larynx: Enlargement of the adenoids. Enlargement of the palatine tonsils.  No evidence of peritonsillar abscess. Airway is patent. Salivary glands: Parotid and submandibular glands are unremarkable. Thyroid: Unremarkable. Lymph nodes: Mildly enlarged suprahyoid cervical and left retropharyngeal lymph nodes are likely reactive. Vascular: No significant abnormality. Suboptimal bolus timing for arterial evaluation. Limited intracranial: No abnormal enhancement. Visualized orbits: Unremarkable. Mastoids and visualized paranasal sinuses: Mastoids and middle ears are clear. There is right maxillary sinus mucosal thickening with a small air-fluid level. Patchy ethmoid mucosal thickening. Skeleton: No significant abnormality. Upper chest: Included lung apices are clear. IMPRESSION: Likely reactive enlargement of the adenoids and palatine tonsils. No peritonsillar abscess. Likely reactive enlarged lymph nodes. Small right maxillary sinus air-fluid level. Nonspecific but can reflect acute sinusitis in the appropriate setting. Electronically Signed   By: Guadlupe Spanish M.D.   On: 05/27/2021 18:44     ____________________________________________   PROCEDURES Procedures  ____________________________________________  DIFFERENTIAL DIAGNOSIS   Peritonsillar abscess , tonsillitis  CLINICAL IMPRESSION / ASSESSMENT AND PLAN / ED COURSE  Medications ordered in the ED: Medications  dexamethasone (DECADRON) injection 10 mg (has no administration in time range)  ketorolac (TORADOL) 30 MG/ML injection 15 mg (has no administration in time range)  sodium chloride 0.9 % bolus 500 mL (has no administration in time range)  iohexol (OMNIPAQUE) 300 MG/ML solution 80 mL (80 mLs Intravenous Contrast Given 05/27/21 1810)    Pertinent labs & imaging results that were available during my care of the patient were reviewed by me and considered in my medical decision making (see chart for details).  Latasha Alexander was evaluated in Emergency Department on 05/27/2021 for the symptoms described in the history of present illness. She was evaluated in the context of the global COVID-19 pandemic, which necessitated consideration that the patient might be at risk for infection with the SARS-CoV-2 virus that causes COVID-19. Institutional protocols and algorithms that pertain to the evaluation of patients at risk for COVID-19 are in a state of rapid change based on information released by regulatory bodies including the CDC and federal and state organizations. These policies and algorithms were followed during the patient's care in the ED.   Patient presents with tonsillitis and significantly enlarged right tonsil with fullness of the right neck suspicious for PTA.  Will obtain CT scan neck.  Clinical Course as of 05/27/21 1929  Sat May 27, 2021  1859 CT negative for abscess.  Will give Decadron and Toradol for supportive care, she is stable for discharge to continue augmentin [PS]    Clinical Course User Index [PS] Sharman Cheek, MD     ____________________________________________   FINAL CLINICAL  IMPRESSION(S) / ED DIAGNOSES    Final diagnoses:  Tonsillitis     ED Discharge Orders     None       Portions of this note were generated with dragon dictation software. Dictation errors may occur despite best attempts at proofreading.    Sharman Cheek, MD 05/27/21 1929

## 2021-05-31 ENCOUNTER — Ambulatory Visit: Admit: 2021-05-31 | Discharge status: Absent without leave | Payer: Medicaid Other

## 2021-05-31 ENCOUNTER — Emergency Department
Admission: EM | Admit: 2021-05-31 | Discharge: 2021-05-31 | Disposition: A | Discharge status: Home / Self Care | Payer: Medicaid Other | Attending: Emergency Medicine | Admitting: Emergency Medicine

## 2021-05-31 ENCOUNTER — Other Ambulatory Visit: Payer: Self-pay

## 2021-05-31 ENCOUNTER — Encounter: Payer: Self-pay | Admitting: Emergency Medicine

## 2021-05-31 DIAGNOSIS — N939 Abnormal uterine and vaginal bleeding, unspecified: Secondary | ICD-10-CM | POA: Diagnosis present

## 2021-05-31 DIAGNOSIS — Z8616 Personal history of COVID-19: Secondary | ICD-10-CM | POA: Insufficient documentation

## 2021-05-31 DIAGNOSIS — E119 Type 2 diabetes mellitus without complications: Secondary | ICD-10-CM | POA: Diagnosis not present

## 2021-05-31 DIAGNOSIS — Z87891 Personal history of nicotine dependence: Secondary | ICD-10-CM | POA: Diagnosis not present

## 2021-05-31 LAB — POC URINE PREG, ED: Preg Test, Ur: NEGATIVE

## 2021-05-31 NOTE — ED Provider Notes (Signed)
[facility] Emergency Department Provider Note  ____________________________________________  Time seen: Approximately 4:39 PM  I have reviewed the triage vital signs and the nursing notes.   HISTORY  Chief Complaint Vaginal Bleeding    HPI Latasha Alexander is a 30 y.o. female with a history of depression anxiety and obesity who comes ED complaining of vaginal bleeding that started yesterday evening, gradual onset, this morning was more profuse.  She has some mild cramping pelvic pain.  No aggravating or alleviating factors.  No abnormal vaginal discharge, no fever or chills.  Had a normal period 2 weeks ago.  She was recently in the emergency department 4 days ago for evaluation of tonsillitis.  Had a negative CT scan of the neck at that time, no abscess.  She received IV Toradol and Decadron for symptom relief.  She reports her tonsillitis is dramatically improved.    Past Medical History:  Diagnosis Date   Anxiety    COVID-19 2021   Depression    Diabetes mellitus without complication (HCC)    GERD (gastroesophageal reflux disease)    Heart murmur    Heartburn    Hypercholesterolemia    OCD (obsessive compulsive disorder)    Panic attacks    PONV (postoperative nausea and vomiting)    after cesarean     Patient Active Problem List   Diagnosis Date Noted   Breakthrough bleeding on depo provera 08/18/2020   OCD (obsessive compulsive disorder) 07/17/2016   Severe recurrent major depression without psychotic features (HCC) 07/16/2016   Pregnant 07/16/2016   Anxiety and depression 07/08/2016   Urinary symptom or sign 07/08/2016   Morbid obesity with BMI of 45.0-49.9, adult (HCC) 06/06/2016     Past Surgical History:  Procedure Laterality Date   CESAREAN SECTION     none       Prior to Admission medications   Medication Sig Start Date End Date Taking? Authorizing Provider  amoxicillin-clavulanate (AUGMENTIN) 875-125 MG tablet Take 1  tablet by mouth every 12 (twelve) hours. 05/25/21   Rodriguez-Southworth, Nettie Elm, PA-C  ARIPiprazole (ABILIFY) 10 MG tablet Take 10 mg by mouth daily.    [provider]  fluticasone (FLONASE) 50 MCG/ACT nasal spray Place 1 spray into both nostrils daily. 04/30/21   Merrilee Jansky, MD  ipratropium (ATROVENT) 0.03 % nasal spray Place 2 sprays into both nostrils every 12 (twelve) hours. 04/30/21   LampteyBritta Mccreedy, MD  paragard intrauterine copper IUD IUD 1 each by Intrauterine route once.    [provider]  sertraline (ZOLOFT) 100 MG tablet Take 200 mg by mouth in the morning and at bedtime.    [provider]  LATUDA 40 MG TABS tablet TAKE 1 TABLET BY MOUTH EVERY DAY WITH SUPPER Patient not taking: No sig reported 08/20/16 12/03/19  Hildred Laser, MD  medroxyPROGESTERone (DEPO-PROVERA) 150 MG/ML injection  06/20/20 09/12/20  [provider]  promethazine (PHENERGAN) 12.5 MG tablet Take 1 tablet (12.5 mg total) by mouth every 6 (six) hours as needed for nausea or vomiting. Patient not taking: Reported on 04/29/2019 07/05/16 12/03/19  Hildred Laser, MD     Allergies Cashew nut oil   Family History  Problem Relation Age of Onset   Diabetes Mother    Hypertension Mother    Hypertension Sister    Stroke Maternal Grandfather     Social History Social History   Tobacco Use   Smoking status: Former    Packs/day: 0.25    Types: Cigarettes  Smokeless tobacco: Never  Vaping Use   Vaping Use: Every day   Substances: Nicotine, Flavoring  Substance Use Topics   Alcohol use: No    Alcohol/week: 0.0 standard drinks   Drug use: No    Review of Systems  Constitutional:   No fever or chills.  ENT:   No sore throat. No rhinorrhea. Cardiovascular:   No chest pain or syncope. Respiratory:   No dyspnea or cough. Gastrointestinal:   Negative for abdominal pain, vomiting and diarrhea.  Positive pelvic pain Musculoskeletal:   Negative for focal pain or  swelling All other systems reviewed and are negative except as documented above in ROS and HPI.  ____________________________________________   PHYSICAL EXAM:  VITAL SIGNS: ED Triage Vitals  Enc Vitals Group     BP 05/31/21 1419 (!) 146/87     Pulse Rate 05/31/21 1419 82     Resp 05/31/21 1419 18     Temp 05/31/21 1419 98.1 F (36.7 C)     Temp Source 05/31/21 1419 Oral     SpO2 05/31/21 1419 100 %     Weight 05/31/21 1406 (!) 341 lb 11.4 oz (155 kg)     Height 05/31/21 1406 5\' 4"  (1.626 m)     Head Circumference --      Peak Flow --      Pain Score 05/31/21 1406 4     Pain Loc --      Pain Edu? --      Excl. in GC? --     Vital signs reviewed, nursing assessments reviewed.   Constitutional:   Alert and oriented. Non-toxic appearance. Eyes:   Conjunctivae are normal. EOMI. PERRL. ENT      Head:   Normocephalic and atraumatic.      Nose:   Wearing a mask.      Mouth/Throat:   Wearing a mask.      Neck:   No meningismus. Full ROM. Hematological/Lymphatic/Immunilogical:   No cervical lymphadenopathy. Cardiovascular:   RRR.. Cap refill less than 2 seconds. Respiratory:   Normal respiratory effort without tachypnea/retractions. Gastrointestinal:   Soft and nontender. Non distended. .  No rebound, rigidity, or guarding.  Musculoskeletal:   Normal range of motion in all extremities. No joint effusions.  No lower extremity tenderness.  No edema. Neurologic:   Normal speech and language.  Motor grossly intact. No acute focal neurologic deficits are appreciated.  Skin:    Skin is warm, dry and intact. No rash noted.  No petechiae, purpura, or bullae.  ____________________________________________    LABS (pertinent positives/negatives) (all labs ordered are listed, but only abnormal results are displayed) Labs Reviewed  POC URINE PREG, ED   ____________________________________________   EKG    ____________________________________________    RADIOLOGY  No  results found.  ____________________________________________   PROCEDURES Procedures  ____________________________________________    CLINICAL IMPRESSION / ASSESSMENT AND PLAN / ED COURSE  Medications ordered in the ED: Medications - No data to display  Pertinent labs & imaging results that were available during my care of the patient were reviewed by me and considered in my medical decision making (see chart for details).  Zarya Lasseigne Pore was evaluated in Emergency Department on 05/31/2021 for the symptoms described in the history of present illness. She was evaluated in the context of the global COVID-19 pandemic, which necessitated consideration that the patient might be at risk for infection with the SARS-CoV-2 virus that causes COVID-19. Institutional protocols and algorithms that pertain to the evaluation  of patients at risk for COVID-19 are in a state of rapid change based on information released by regulatory bodies including the CDC and federal and state organizations. These policies and algorithms were followed during the patient's care in the ED.   Patient presents with vaginal bleeding x1 day.  By her description it is not brisk.  No symptoms of infection.  Pregnancy test negative.  I think this is due to hormonal cycle being disrupted by Decadron.  She does not appear to be anemic clinically, she is nontoxic, not orthostatic, stable for discharge.  She will continue to monitor symptoms at home, follow-up in ED or primary care if not improving in the next 24 hours.      ____________________________________________   FINAL CLINICAL IMPRESSION(S) / ED DIAGNOSES    Final diagnoses:  Vaginal bleeding     ED Discharge Orders     None       Portions of this note were generated with dragon dictation software. Dictation errors may occur despite best attempts at proofreading.    Sharman Cheek, MD 05/31/21 [phone]

## 2021-05-31 NOTE — ED Triage Notes (Signed)
Pt comes into the ED via POV c/o vaginal bleeding.  Pt states that she had her menstrual cycle 2 weeks ago but has started bleeding again yesterday.  Pt states it is a heavy bleeding, and she has changed 2 pas since 11:00 this morning.  Pt in NAD at this time.

## 2021-06-26 ENCOUNTER — Telehealth: Payer: Medicaid Other | Admitting: Physician Assistant

## 2021-06-26 DIAGNOSIS — Z91199 Patient's noncompliance with other medical treatment and regimen due to unspecified reason: Secondary | ICD-10-CM

## 2021-06-26 NOTE — Progress Notes (Signed)
The patient no-showed for appointment despite this provider sending direct link, reaching out via phone with no response and waiting for at least 10 minutes from appointment time for patient to join. They will be marked as a NS for this appointment/time.  ? ?Paeton Latouche Cody Chardai Gangemi, PA-C ? ? ? ?

## 2021-08-30 ENCOUNTER — Ambulatory Visit: Payer: Medicaid Other | Admitting: Obstetrics

## 2021-08-30 ENCOUNTER — Other Ambulatory Visit: Payer: Self-pay

## 2021-08-30 ENCOUNTER — Encounter: Payer: Self-pay | Admitting: Obstetrics

## 2021-08-30 VITALS — BP 146/85 | HR 88 | Ht 64.0 in | Wt 347.2 lb

## 2021-08-30 DIAGNOSIS — N926 Irregular menstruation, unspecified: Secondary | ICD-10-CM

## 2021-08-30 LAB — POCT URINE PREGNANCY: Preg Test, Ur: NEGATIVE

## 2021-08-30 NOTE — Progress Notes (Signed)
GYN ENCOUNTER FOR MISSED MENSES ? ?Subjective ? ?HPI ?Latasha Alexander is a 31 y.o. G2P1011 who presents for evaluation of a missed period with a Paragard IUD. Her LMP was 07/17/21. She normally gets her period every 28-32 days and she has not missed a period prior to this. She denies recent weight gain or loss, any new stressors, any changes to medications, and exposure to infection. She is sexually active. She has had a negative pregnancy test at home. She has had a headache, some abdominal cramping, and has vomited twice.  ? ?ROS negative excepted as noted in HPI. ? ?Objective ? ?BP (!) 146/85   Pulse 88   Ht 5\' 4"  (1.626 m)   Wt (!) 347 lb 3.2 oz (157.5 kg)   LMP 07/17/2021 (Exact Date)   BMI 59.60 kg/m?  ? ?UPT: negative ? ?Pelvic exam done to determine if IUD strings were visible. Difficulty visualizing cervix due to body habitus. Vaginal walls pink with normal discharge and no s/s of infection. Exam discontinued. ? ?Assessment ? ?1) Missed menses ? ?Plan ? ?1) Beta HCG and prolactin today. Maisie would like Claris Che to determine location of IUD and rule out pregnancy. Will follow up based on labs and ultrasound. ? ?[place], CNM ?

## 2021-08-31 ENCOUNTER — Telehealth: Payer: Self-pay | Admitting: Obstetrics

## 2021-08-31 ENCOUNTER — Encounter: Payer: Self-pay | Admitting: Obstetrics

## 2021-08-31 LAB — HUMAN CHORIONIC GONADOTROPIN(HCG),B-SUBUNIT,QUANTITATIVE): HCG, Beta Chain, Quant, S: <1 m[IU]/mL

## 2021-08-31 LAB — PROLACTIN: Prolactin: 4.4 ng/mL — ABNORMAL LOW (ref 4.8–23.3)

## 2021-08-31 NOTE — Telephone Encounter (Signed)
Pt called with questions about lab results from lab performed yesterday. Please Advise.  ?

## 2021-09-03 ENCOUNTER — Other Ambulatory Visit: Payer: Self-pay

## 2021-09-03 ENCOUNTER — Emergency Department: Payer: Medicaid Other

## 2021-09-03 ENCOUNTER — Encounter: Payer: Self-pay | Admitting: Emergency Medicine

## 2021-09-03 ENCOUNTER — Emergency Department
Admission: EM | Admit: 2021-09-03 | Discharge: 2021-09-03 | Disposition: A | Discharge status: Home / Self Care | Payer: Medicaid Other | Attending: Emergency Medicine | Admitting: Emergency Medicine

## 2021-09-03 ENCOUNTER — Ambulatory Visit: Admission: EM | Admit: 2021-09-03 | Payer: Medicaid Other | Source: Home / Self Care

## 2021-09-03 DIAGNOSIS — R112 Nausea with vomiting, unspecified: Secondary | ICD-10-CM | POA: Diagnosis not present

## 2021-09-03 DIAGNOSIS — R0789 Other chest pain: Secondary | ICD-10-CM | POA: Insufficient documentation

## 2021-09-03 DIAGNOSIS — R079 Chest pain, unspecified: Secondary | ICD-10-CM

## 2021-09-03 DIAGNOSIS — E119 Type 2 diabetes mellitus without complications: Secondary | ICD-10-CM | POA: Insufficient documentation

## 2021-09-03 DIAGNOSIS — Z8616 Personal history of COVID-19: Secondary | ICD-10-CM | POA: Insufficient documentation

## 2021-09-03 LAB — CBC
HCT: 41.6 % (ref 36.0–46.0)
Hemoglobin: 13 g/dL (ref 12.0–15.0)
MCH: 28.9 pg (ref 26.0–34.0)
MCHC: 31.3 g/dL (ref 30.0–36.0)
MCV: 92.4 fL (ref 80.0–100.0)
Platelets: 355 10*3/uL (ref 150–400)
RBC: 4.5 MIL/uL (ref 3.87–5.11)
RDW: 13 % (ref 11.5–15.5)
WBC: 7.7 10*3/uL (ref 4.0–10.5)
nRBC: 0 % (ref 0.0–0.2)

## 2021-09-03 LAB — BASIC METABOLIC PANEL
Anion gap: 8 (ref 5–15)
BUN: 12 mg/dL (ref 6–20)
CO2: 26 mmol/L (ref 22–32)
Calcium: 9.1 mg/dL (ref 8.9–10.3)
Chloride: 104 mmol/L (ref 98–111)
Creatinine, Ser: 0.72 mg/dL (ref 0.44–1.00)
GFR, Estimated: >60 mL/min (ref >60)
Glucose, Bld: 132 mg/dL — ABNORMAL HIGH (ref 70–99)
Potassium: 4.2 mmol/L (ref 3.5–5.1)
Sodium: 138 mmol/L (ref 135–145)

## 2021-09-03 LAB — TROPONIN I (HIGH SENSITIVITY): Troponin I (High Sensitivity): 5 ng/L (ref <18)

## 2021-09-03 LAB — POC URINE PREG, ED: Preg Test, Ur: NEGATIVE

## 2021-09-03 NOTE — ED Triage Notes (Signed)
Pt via POV from home. Pt states she felt like she had to vomit and her chest started to burn. Pt states she vomiting once. Pt state pain is intermittent. Denies any SOB. Denies cough. Pt is A&OX4 and NAD. Denies any pain right now.  ?

## 2021-09-03 NOTE — ED Provider Notes (Signed)
? ?[facility] ?Provider Note ? ? ? Event Date/Time  ? First MD Initiated Contact with Patient 09/03/21 1407   ?  (approximate) ? ? ?History  ? ?Chest Pain and Nausea ? ? ?HPI ? ?Latasha Alexander is a 31 y.o. female with past medical history of anxiety, OCD, GERD who presents with chest pain.  Symptoms started earlier this morning.  Patient felt she was going to get sick and then subsequently vomited.  She then felt a burning pressure-like sensation in her chest that lasted for several minutes and then subsided.  There was no associated shortness of breath diaphoresis.  She notes that for short time afterward both arms from the elbows down felt numb but then this resolved.  She is now asymptomatic.  No diarrhea abdominal pain or ongoing chest pain.  No history of DVT/PE. ?  ? ?Past Medical History:  ?Diagnosis Date  ? Anxiety   ? COVID-19 2021  ? Depression   ? Diabetes mellitus without complication (HCC)   ? GERD (gastroesophageal reflux disease)   ? Heart murmur   ? Heartburn   ? Hypercholesterolemia   ? OCD (obsessive compulsive disorder)   ? Panic attacks   ? PONV (postoperative nausea and vomiting)   ? after cesarean  ? ? ?Patient Active Problem List  ? Diagnosis Date Noted  ? Breakthrough bleeding on depo provera 08/18/2020  ? OCD (obsessive compulsive disorder) 07/17/2016  ? Severe recurrent major depression without psychotic features (HCC) 07/16/2016  ? Pregnant 07/16/2016  ? Anxiety and depression 07/08/2016  ? Urinary symptom or sign 07/08/2016  ? Morbid obesity with BMI of 45.0-49.9, adult (HCC) 06/06/2016  ? ? ? ?Physical Exam  ?Triage Vital Signs: ?ED Triage Vitals  ?Enc Vitals Group  ?   BP 09/03/21 1344 138/83  ?   Pulse Rate 09/03/21 1344 82  ?   Resp 09/03/21 1344 16  ?   Temp 09/03/21 1344 98.3 ?F (36.8 ?C)  ?   Temp Source 09/03/21 1344 Oral  ?   SpO2 09/03/21 1344 96 %  ?   Weight 09/03/21 1338 (!) 340 lb (154.2 kg)  ?   Height 09/03/21 1338 5\' 4"  (1.626 m)  ?   Head  Circumference --   ?   Peak Flow --   ?   Pain Score 09/03/21 1337 0  ?   Pain Loc --   ?   Pain Edu? --   ?   Excl. in GC? --   ? ? ?Most recent vital signs: ?Vitals:  ? 09/03/21 1344  ?BP: 138/83  ?Pulse: 82  ?Resp: 16  ?Temp: 98.3 ?F (36.8 ?C)  ?SpO2: 96%  ? ? ? ?General: Awake, no distress. obese ?CV:  Good peripheral perfusion.  No edema ?Resp:  Normal effort.  Lungs are clear ?Abd:  No distention.  Abdomen soft nontender ?Neuro:             Awake, Alert, Oriented x 3  ?Other:   ? ? ?ED Results / Procedures / Treatments  ?Labs ?(all labs ordered are listed, but only abnormal results are displayed) ?Labs Reviewed  ?BASIC METABOLIC PANEL - Abnormal; Notable for the following components:  ?    Result Value  ? Glucose, Bld 132 (*)   ? All other components within normal limits  ?CBC  ?POC URINE PREG, ED  ?TROPONIN I (HIGH SENSITIVITY)  ? ? ? ?EKG ? ?EKG interpretation performed by myself: NSR, nml axis, nml intervals,  no acute ischemic changes ? ? ?RADIOLOGY ?I reviewed the CXR which does not show any acute cardiopulmonary process; agree with radiology report  ? ? ? ?PROCEDURES: ? ?Critical Care performed: No ? ?Procedures ? ? ?MEDICATIONS ORDERED IN ED: ?Medications - No data to display ? ? ?IMPRESSION / MDM / ASSESSMENT AND PLAN / ED COURSE  ?I reviewed the triage vital signs and the nursing notes. ?             ?               ? ?Differential diagnosis includes, but is not limited to, chest pain secondary to vomiting, GERD, gastroenteritis, less likely ACS PE ? ?Patient is a 31 year old female who presents with chest burning after an episode of emesis today.  Occurred around 11 AM.  Felt she was going to get sick and after vomiting felt a burning type feeling in her chest as well as paresthesias from the elbows down bilaterally.  This lasted for several minutes and then resolved and she has been asymptomatic since.  Chest pain is quite atypical and I suspect that it was due to the acid from vomiting as well as  increased intrathoracic pressure.  Her chest x-ray reviewed is negative for acute abnormality.  EKG also without ischemic changes.  She has no abdominal pain to suggest acute abdominal processes responsible for her emesis.  Her hCG is negative.  Troponin greater than 3 hours after presentation is negative.  Ultimately given she is pain-free with this reassuring story I think she is appropriate discharge.  We discussed return precautions. ? ?Clinical Course as of 09/03/21 1455  ?Wynelle Link Sep 03, 2021  ?1433 Preg Test, Ur: Negative [KM]  ?  ?Clinical Course User Index ?[KM] Georga Hacking, MD  ? ? ? ?FINAL CLINICAL IMPRESSION(S) / ED DIAGNOSES  ? ?Final diagnoses:  ?Chest pain, unspecified type  ? ? ? ?Rx / DC Orders  ? ?ED Discharge Orders   ? ? None  ? ?  ? ? ? ?Note:  This document was prepared using Dragon voice recognition software and may include unintentional dictation errors. ?  ?Georga Hacking, MD ?09/03/21 1456 ? ?

## 2021-09-12 ENCOUNTER — Other Ambulatory Visit: Payer: Self-pay

## 2021-09-12 ENCOUNTER — Ambulatory Visit (INDEPENDENT_AMBULATORY_CARE_PROVIDER_SITE_OTHER): Payer: Medicaid Other

## 2021-09-12 DIAGNOSIS — N912 Amenorrhea, unspecified: Secondary | ICD-10-CM

## 2021-09-12 DIAGNOSIS — N939 Abnormal uterine and vaginal bleeding, unspecified: Secondary | ICD-10-CM | POA: Diagnosis not present

## 2021-09-21 ENCOUNTER — Encounter: Payer: Self-pay | Admitting: Obstetrics

## 2021-10-02 ENCOUNTER — Telehealth: Payer: Self-pay

## 2021-10-02 NOTE — Telephone Encounter (Signed)
Called pt regarding mychart message, Left vm for pt to call the office back.  ?

## 2021-10-31 ENCOUNTER — Encounter: Payer: Medicaid Other | Admitting: Obstetrics & Gynecology

## 2021-12-09 ENCOUNTER — Ambulatory Visit
Admission: RE | Admit: 2021-12-09 | Discharge: 2021-12-09 | Disposition: A | Discharge status: Home / Self Care | Payer: Medicaid Other | Source: Ambulatory Visit | Attending: Emergency Medicine | Admitting: Emergency Medicine

## 2021-12-09 VITALS — BP 129/80 | HR 78 | Temp 98.5°F | Resp 14 | Ht 64.0 in | Wt 338.0 lb

## 2021-12-09 DIAGNOSIS — N760 Acute vaginitis: Secondary | ICD-10-CM | POA: Diagnosis not present

## 2021-12-09 DIAGNOSIS — B9689 Other specified bacterial agents as the cause of diseases classified elsewhere: Secondary | ICD-10-CM | POA: Diagnosis not present

## 2021-12-09 DIAGNOSIS — B3731 Acute candidiasis of vulva and vagina: Secondary | ICD-10-CM | POA: Diagnosis not present

## 2021-12-09 LAB — URINALYSIS, ROUTINE W REFLEX MICROSCOPIC
Bilirubin Urine: NEGATIVE
Glucose, UA: NEGATIVE mg/dL
Hgb urine dipstick: NEGATIVE
Ketones, ur: NEGATIVE mg/dL
Nitrite: NEGATIVE
Protein, ur: NEGATIVE mg/dL
Specific Gravity, Urine: 1.025 (ref 1.005–1.030)
pH: 5.5 (ref 5.0–8.0)

## 2021-12-09 LAB — URINALYSIS, MICROSCOPIC (REFLEX)

## 2021-12-09 LAB — WET PREP, GENITAL
Sperm: NONE SEEN
Trich, Wet Prep: NONE SEEN
WBC, Wet Prep HPF POC: <10 — AB (ref <10)

## 2021-12-09 MED ORDER — FLUCONAZOLE 200 MG PO TABS: 200.0000 mg | ORAL_TABLET | Freq: Every day | ORAL | 1 refills | Status: AC

## 2021-12-09 MED ORDER — METRONIDAZOLE 500 MG PO TABS: 500.0000 mg | ORAL_TABLET | Freq: Two times a day (BID) | ORAL | 0 refills | Status: DC

## 2021-12-09 NOTE — ED Provider Notes (Signed)
[facility]    CSN: [id number] Arrival date & time: 12/09/21  1249      History   Chief Complaint Chief Complaint  Patient presents with   Urinary Frequency   Back Pain    HPI Latasha Alexander is a 31 y.o. female.   HPI  31 year old female here for evaluation of urinary complaints.  Patient reports that she has been experiencing tingling with urination, chills, right-sided low back pain, and seeing blood in her urine for the past 2 days.  She denies any fever, abdominal pain, nausea, vomiting, vaginal discharge, or vaginal itching.  Past Medical History:  Diagnosis Date   Anxiety    COVID-19 2021   Depression    Diabetes mellitus without complication (HCC)    GERD (gastroesophageal reflux disease)    Heart murmur    Heartburn    Hypercholesterolemia    OCD (obsessive compulsive disorder)    Panic attacks    PONV (postoperative nausea and vomiting)    after cesarean    Patient Active Problem List   Diagnosis Date Noted   OCD (obsessive compulsive disorder) 07/17/2016   Severe recurrent major depression without psychotic features (HCC) 07/16/2016   Anxiety and depression 07/08/2016   Morbid obesity with BMI of 45.0-49.9, adult (HCC) 06/06/2016    Past Surgical History:  Procedure Laterality Date   CESAREAN SECTION     none      OB History     Gravida  2   Para  1   Term  1   Preterm      AB  1   Living  1      SAB      IAB      Ectopic      Multiple      Live Births               Home Medications    Prior to Admission medications   Medication Sig Start Date End Date Taking? Authorizing Provider  fluconazole (DIFLUCAN) 200 MG tablet Take 1 tablet (200 mg total) by mouth daily for 2 doses. 12/09/21 12/11/21 Yes Becky Augusta, NP  metroNIDAZOLE (FLAGYL) 500 MG tablet Take 1 tablet (500 mg total) by mouth 2 (two) times daily. 12/09/21  Yes Becky Augusta, NP  paragard intrauterine copper IUD IUD 1 each by Intrauterine  route once.   Yes [provider]  promethazine (PHENERGAN) 12.5 MG tablet Take 1 tablet (12.5 mg total) by mouth every 6 (six) hours as needed for nausea or vomiting. Patient not taking: Reported on 04/29/2019 07/05/16 12/03/19  Hildred Laser, MD    Family History Family History  Problem Relation Age of Onset   Diabetes Mother    Hypertension Mother    Hypertension Sister    Stroke Maternal Grandfather     Social History Social History   Tobacco Use   Smoking status: Former    Packs/day: 0.25    Types: Cigarettes   Smokeless tobacco: Never  Vaping Use   Vaping Use: Every day   Substances: Nicotine, Flavoring  Substance Use Topics   Alcohol use: No    Alcohol/week: 0.0 standard drinks of alcohol   Drug use: No     Allergies   Cashew nut oil   Review of Systems Review of Systems  Constitutional:  Negative for fever.  Gastrointestinal:  Negative for abdominal pain, nausea and vomiting.  Genitourinary:  Positive for dysuria, frequency, hematuria and urgency. Negative for vaginal discharge and  vaginal pain.  Musculoskeletal:  Positive for back pain.  Hematological: Negative.   Psychiatric/Behavioral: Negative.       Physical Exam Triage Vital Signs ED Triage Vitals  Enc Vitals Group     BP 12/09/21 1304 129/80     Pulse Rate 12/09/21 1304 78     Resp 12/09/21 1304 14     Temp 12/09/21 1304 98.5 F (36.9 C)     Temp Source 12/09/21 1304 Oral     SpO2 12/09/21 1304 96 %     Weight 12/09/21 1302 (!) 338 lb (153.3 kg)     Height 12/09/21 1302 5\' 4"  (1.626 m)     Head Circumference --      Peak Flow --      Pain Score 12/09/21 1302 6     Pain Loc --      Pain Edu? --      Excl. in [place]? --    No data found.  Updated Vital Signs BP 129/80 (BP Location: Left Arm)   Pulse 78   Temp 98.5 F (36.9 C) (Oral)   Resp 14   Ht 5\' 4"  (1.626 m)   Wt (!) 338 lb (153.3 kg)   SpO2 96%   BMI 58.02 kg/m   Visual Acuity Right Eye Distance:   Left Eye  Distance:   Bilateral Distance:    Right Eye Near:   Left Eye Near:    Bilateral Near:     Physical Exam Vitals and nursing note reviewed.  Constitutional:      Appearance: Normal appearance. She is not ill-appearing.  HENT:     Head: Normocephalic and atraumatic.  Cardiovascular:     Rate and Rhythm: Normal rate and regular rhythm.     Pulses: Normal pulses.     Heart sounds: Normal heart sounds. No murmur heard.    No friction rub. No gallop.  Pulmonary:     Effort: Pulmonary effort is normal.     Breath sounds: Normal breath sounds. No wheezing, rhonchi or rales.  Abdominal:     Palpations: Abdomen is soft.     Tenderness: There is no abdominal tenderness. There is no right CVA tenderness or left CVA tenderness.  Skin:    General: Skin is warm and dry.     Capillary Refill: Capillary refill takes less than 2 seconds.     Findings: No erythema or rash.  Neurological:     General: No focal deficit present.     Mental Status: She is alert and oriented to person, place, and time.  Psychiatric:        Mood and Affect: Mood normal.        Behavior: Behavior normal.        Thought Content: Thought content normal.        Judgment: Judgment normal.      UC Treatments / Results  Labs (all labs ordered are listed, but only abnormal results are displayed) Labs Reviewed  WET PREP, GENITAL - Abnormal; Notable for the following components:      Result Value   Yeast Wet Prep HPF POC PRESENT (*)    Clue Cells Wet Prep HPF POC PRESENT (*)    WBC, Wet Prep HPF POC <10 (*)    All other components within normal limits  URINALYSIS, ROUTINE W REFLEX MICROSCOPIC - Abnormal; Notable for the following components:   APPearance HAZY (*)    Leukocytes,Ua TRACE (*)    All other components within normal limits  URINALYSIS, MICROSCOPIC (REFLEX) - Abnormal; Notable for the following components:   Bacteria, UA FEW (*)    All other components within normal limits    EKG   Radiology No  results found.  Procedures Procedures (including critical care time)  Medications Ordered in UC Medications - No data to display  Initial Impression / Assessment and Plan / UC Course  I have reviewed the triage vital signs and the nursing notes.  Pertinent labs & imaging results that were available during my care of the patient were reviewed by me and considered in my medical decision making (see chart for details).  Patient is a pleasant, nontoxic-appearing 31 year old female here for evaluation of urinary frequency, urgency, and tingling with urination has been going on for the past 2 days.  She states that last night she started having pain in her right low back that would radiate around her right flank and noticed some blood in her urine.  She denies any vaginal complaints.  She also denies abdominal pain, fever, nausea, or vomiting.  She does not have a history of kidney stones but she does have a history of diabetes, heart murmur, GERD, hypercholesterolemia, and a host of psychiatric illnesses.  Her physical exam reveals a benign cardiopulmonary exam with S1-S2 heart sounds displaying regular rate and rhythm and lung sounds that are clear to auscultation in all fields.  Patient is no CVA tenderness on exam.  Abdomen is soft and nontender.  Urinalysis was collected at triage and is pending.  Urinalysis shows a hazy appearance with trace leukocyte esterase.  Is negative for nitrates, protein, or ketones.  The reflex micro shows 6-10 squamous epithelials and 6-10 WBCs with few bacteria.  The urine specimen is contaminated so I will not order a culture.  I will order vaginal wet prep.  Vaginal wet prep reveals a presence of yeast and clue cells.  I will discharge patient home on metronidazole for treatment of the BV and Diflucan for treatment of her vaginal yeast infection.   Final Clinical Impressions(s) / UC Diagnoses   Final diagnoses:  Bacterial vaginosis  Vaginal yeast infection      Discharge Instructions      Take the Flagyl (metronidazole) 500 mg twice daily for treatment of your bacterial vaginosis.  Avoid alcohol while on the metronidazole as taken together will cause of vomiting.  Bacterial vaginosis is often caused by a imbalance of bacteria in your vaginal vault.  This is sometimes a result of using tampons or hormonal fluctuations during her menstrual cycle.  You if your symptoms are recurrent you can try using a boric acid suppository twice weekly to help maintain the acid-base balance in your vagina vault which could prevent further infection.  You can also try vaginal probiotics to help return normal bacterial balance.   Take the Diflucan now and repeat in 1 week if your symptoms persist.     ED Prescriptions     Medication Sig Dispense Auth. Provider   metroNIDAZOLE (FLAGYL) 500 MG tablet Take 1 tablet (500 mg total) by mouth 2 (two) times daily. 14 tablet Becky Augusta, NP   fluconazole (DIFLUCAN) 200 MG tablet Take 1 tablet (200 mg total) by mouth daily for 2 doses. 2 tablet Becky Augusta, NP      PDMP not reviewed this encounter.   Becky Augusta, NP 12/09/21 1356

## 2021-12-09 NOTE — ED Triage Notes (Signed)
Patient c/o urinary frequency and right sided lower back pain that started 2 days ago.  Patient reports some blood in her urine.

## 2021-12-09 NOTE — Discharge Instructions (Addendum)
Take the Flagyl (metronidazole) 500 mg twice daily for treatment of your bacterial vaginosis.  Avoid alcohol while on the metronidazole as taken together will cause of vomiting.  Bacterial vaginosis is often caused by a imbalance of bacteria in your vaginal vault.  This is sometimes a result of using tampons or hormonal fluctuations during her menstrual cycle.  You if your symptoms are recurrent you can try using a boric acid suppository twice weekly to help maintain the acid-base balance in your vagina vault which could prevent further infection.  You can also try vaginal probiotics to help return normal bacterial balance.   Take the Diflucan now and repeat in 1 week if your symptoms persist.

## 2021-12-26 ENCOUNTER — Inpatient Hospital Stay: Admission: RE | Admit: 2021-12-26 | Payer: Self-pay | Source: Ambulatory Visit

## 2021-12-27 ENCOUNTER — Ambulatory Visit
Admission: RE | Admit: 2021-12-27 | Discharge: 2021-12-27 | Disposition: A | Discharge status: Home / Self Care | Payer: Medicaid Other | Source: Ambulatory Visit | Attending: Emergency Medicine | Admitting: Emergency Medicine

## 2021-12-27 VITALS — BP 141/86 | HR 100 | Temp 97.9°F | Resp 20

## 2021-12-27 DIAGNOSIS — J069 Acute upper respiratory infection, unspecified: Secondary | ICD-10-CM

## 2021-12-27 MED ORDER — BENZONATATE 100 MG PO CAPS
100.0000 mg | ORAL_CAPSULE | Freq: Three times a day (TID) | ORAL | 0 refills | Status: DC | PRN
Start: 2021-12-27 — End: 2022-01-05

## 2021-12-27 NOTE — ED Triage Notes (Signed)
Pt here with productive cough, headache, nasal congestion, and scratchy throat x 2 days. Had virtual visit with PCP who dx with a viral URI and she states the OTC meds she is taking is not helping.

## 2021-12-27 NOTE — Discharge Instructions (Addendum)
Take the Tessalon Perles as directed for cough.  Follow up with your primary care provider if your symptoms are not improving.    

## 2021-12-27 NOTE — ED Provider Notes (Signed)
Latasha Alexander    CSN: [id number] Arrival date & time: 12/27/21  1843      History   Chief Complaint Chief Complaint  Patient presents with   Cough   Headache   Nasal Congestion   Sore Throat    HPI Latasha Alexander is a 31 y.o. female.  Patient presents with 4-day history of postnasal drip, congestion, scratchy throat, cough, headache.  She denies fever, rash, shortness of breath, vomiting, diarrhea, or other symptoms.  Treatment attempted with Tylenol and ibuprofen.  Patient had a telemedicine visit with [facility] on 12/23/2021 and was diagnosed with viral sinusitis.  Her medical history includes diabetes, morbid obesity, depression, anxiety, panic attacks, obsessive-compulsive disorder, GERD, hypercholesterolemia.  The history is provided by the patient and medical records.    Past Medical History:  Diagnosis Date   Anxiety    COVID-19 2021   Depression    Diabetes mellitus without complication (HCC)    GERD (gastroesophageal reflux disease)    Heart murmur    Heartburn    Hypercholesterolemia    OCD (obsessive compulsive disorder)    Panic attacks    PONV (postoperative nausea and vomiting)    after cesarean    Patient Active Problem List   Diagnosis Date Noted   OCD (obsessive compulsive disorder) 07/17/2016   Severe recurrent major depression without psychotic features (HCC) 07/16/2016   Anxiety and depression 07/08/2016   Morbid obesity with BMI of 45.0-49.9, adult (HCC) 06/06/2016    Past Surgical History:  Procedure Laterality Date   CESAREAN SECTION     none      OB History     Gravida  2   Para  1   Term  1   Preterm      AB  1   Living  1      SAB      IAB      Ectopic      Multiple      Live Births               Home Medications    Prior to Admission medications   Medication Sig Start Date End Date Taking? Authorizing Provider  benzonatate (TESSALON) 100 MG capsule Take 1 capsule (100 mg total) by mouth 3 (three)  times daily as needed for cough. 12/27/21  Yes Mickie Bail, NP  metroNIDAZOLE (FLAGYL) 500 MG tablet Take 1 tablet (500 mg total) by mouth 2 (two) times daily. 12/09/21   Becky Augusta, NP  paragard intrauterine copper IUD IUD 1 each by Intrauterine route once.    [provider]  promethazine (PHENERGAN) 12.5 MG tablet Take 1 tablet (12.5 mg total) by mouth every 6 (six) hours as needed for nausea or vomiting. Patient not taking: Reported on 04/29/2019 07/05/16 12/03/19  Hildred Laser, MD    Family History Family History  Problem Relation Age of Onset   Diabetes Mother    Hypertension Mother    Hypertension Sister    Stroke Maternal Grandfather     Social History Social History   Tobacco Use   Smoking status: Former    Packs/day: 0.25    Types: Cigarettes   Smokeless tobacco: Never  Vaping Use   Vaping Use: Every day   Substances: Nicotine, Flavoring  Substance Use Topics   Alcohol use: No    Alcohol/week: 0.0 standard drinks of alcohol   Drug use: No     Allergies   Cashew nut oil  Review of Systems Review of Systems  Constitutional:  Negative for chills and fever.  HENT:  Positive for congestion, postnasal drip and voice change. Negative for ear pain and sore throat.   Respiratory:  Positive for cough. Negative for shortness of breath.   Cardiovascular:  Negative for chest pain and palpitations.  Gastrointestinal:  Negative for diarrhea and vomiting.  Skin:  Negative for color change and rash.  All other systems reviewed and are negative.    Physical Exam Triage Vital Signs ED Triage Vitals  Enc Vitals Group     BP      Pulse      Resp      Temp      Temp src      SpO2      Weight      Height      Head Circumference      Peak Flow      Pain Score      Pain Loc      Pain Edu?      Excl. in GC?    No data found.  Updated Vital Signs BP (!) 141/86   Pulse 100   Temp 97.9 F (36.6 C)   Resp 20   SpO2 99%   Visual Acuity Right Eye  Distance:   Left Eye Distance:   Bilateral Distance:    Right Eye Near:   Left Eye Near:    Bilateral Near:     Physical Exam Vitals and nursing note reviewed.  Constitutional:      General: She is not in acute distress.    Appearance: She is well-developed. She is obese. She is not ill-appearing.  HENT:     Right Ear: Tympanic membrane normal.     Left Ear: Tympanic membrane normal.     Nose: Nose normal.     Mouth/Throat:     Mouth: Mucous membranes are moist.     Pharynx: Oropharynx is clear.     Comments: Clear PND Cardiovascular:     Rate and Rhythm: Normal rate and regular rhythm.     Heart sounds: Normal heart sounds.  Pulmonary:     Effort: Pulmonary effort is normal. No respiratory distress.     Breath sounds: Normal breath sounds.  Musculoskeletal:     Cervical back: Neck supple.  Skin:    General: Skin is warm and dry.  Neurological:     Mental Status: She is alert.  Psychiatric:        Mood and Affect: Mood normal.        Behavior: Behavior normal.      UC Treatments / Results  Labs (all labs ordered are listed, but only abnormal results are displayed) Labs Reviewed - No data to display  EKG   Radiology No results found.  Procedures Procedures (including critical care time)  Medications Ordered in UC Medications - No data to display  Initial Impression / Assessment and Plan / UC Course  I have reviewed the triage vital signs and the nursing notes.  Pertinent labs & imaging results that were available during my care of the patient were reviewed by me and considered in my medical decision making (see chart for details).   Viral URI with cough.  Patient has been symptomatic for 4 days.  She is well-appearing and her exam is reassuring.  Treating cough with Tessalon Perles.  Discussed other symptomatic care including Tylenol or ibuprofen as needed.  Instructed patient to follow up with  her PCP if her symptoms are not improving.  She agrees to plan  of care.   Final Clinical Impressions(s) / UC Diagnoses   Final diagnoses:  Viral URI with cough     Discharge Instructions      Take the Tessalon Perles as directed for cough.  Follow up with your primary care provider if your symptoms are not improving.        ED Prescriptions     Medication Sig Dispense Auth. Provider   benzonatate (TESSALON) 100 MG capsule Take 1 capsule (100 mg total) by mouth 3 (three) times daily as needed for cough. 21 capsule Mickie Bail, NP      PDMP not reviewed this encounter.   Mickie Bail, NP 12/27/21 1909

## 2022-01-05 ENCOUNTER — Ambulatory Visit
Admission: EM | Admit: 2022-01-05 | Discharge: 2022-01-05 | Disposition: A | Discharge status: Home / Self Care | Payer: Medicaid Other

## 2022-01-05 DIAGNOSIS — Q828 Other specified congenital malformations of skin: Secondary | ICD-10-CM

## 2022-01-05 DIAGNOSIS — E08628 Diabetes mellitus due to underlying condition with other skin complications: Secondary | ICD-10-CM | POA: Diagnosis not present

## 2022-01-05 DIAGNOSIS — M2141 Flat foot [pes planus] (acquired), right foot: Secondary | ICD-10-CM | POA: Diagnosis not present

## 2022-01-05 DIAGNOSIS — M2142 Flat foot [pes planus] (acquired), left foot: Secondary | ICD-10-CM

## 2022-01-05 MED ORDER — UREA-SALICYLIC ACID 39.5-2 % EX CREA: 1.0000 | TOPICAL_CREAM | Freq: Two times a day (BID) | CUTANEOUS | 0 refills | Status: AC

## 2022-01-05 NOTE — ED Triage Notes (Signed)
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   Patient is requesting an accommodation letter for work.

## 2022-01-05 NOTE — Discharge Instructions (Addendum)
Please start using the topical cream prescribed today, twice daily to the affected areas of both heels. Go to [facility] and get a foot analysis Follow-up with the podiatrist for possible custom orthotics to help with your flatfeet. Follow-up with your primary care physician to discuss getting on metformin ER, this will reduce the GI side effects.  Proper glucose control is imperative to help with foot healing.

## 2022-01-05 NOTE — ED Provider Notes (Signed)
[facility]    CSN: [id number] Arrival date & time: 01/05/22  1212      History   Chief Complaint Chief Complaint  Patient presents with   Foot Pain    HPI Latasha Alexander is a 31 y.o. female.   31yo female presents today due to bilateral foot/ heel pain. 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 She was given a bottle of urea cream to use. Still has some left, is uncertain the percentage. It has helped some. Reports that the fissures and cracks intermittently bleed. She does have DM, last A1C was July 2022 and was 6.5%. She has since stopped taking her metformin due to GI side effects. Does not like to monitor her glucose with painful finger pricks. Is having pain while standing for prolonged periods of time. Works for [employer], states she has a shoe that she is required to wear at work. Puts OTC shoe inserts in them.    Foot Pain    Past Medical History:  Diagnosis Date   Anxiety    COVID-19 2021   Depression    Diabetes mellitus without complication (HCC)    GERD (gastroesophageal reflux disease)    Heart murmur    Heartburn    Hypercholesterolemia    OCD (obsessive compulsive disorder)    Panic attacks    PONV (postoperative nausea and vomiting)    after cesarean    Patient Active Problem List   Diagnosis Date Noted   OCD (obsessive compulsive disorder) 07/17/2016   Severe recurrent major depression without psychotic features ([place]) 07/16/2016   Anxiety and depression 07/08/2016   Morbid obesity with BMI of 45.0-49.9, adult ([place]) 06/06/2016    Past Surgical History:  Procedure Laterality Date   CESAREAN SECTION     none      OB History     Gravida  2   Para  1   Term  1   Preterm      AB  1   Living  1      SAB      IAB      Ectopic       Multiple      Live Births               Home Medications    Prior to Admission medications   Medication Sig Start Date End Date Taking? Authorizing Provider  ABILIFY 10 MG tablet Take 10 mg by mouth daily. 12/29/21  Yes [provider]  Salicylic Acid-Urea (UREA-SALICYLIC ACID) 123XX123 % CREA Apply 1 Application topically in the morning and at bedtime. 01/05/22  Yes Parish Augustine L, PA  sertraline (ZOLOFT) 100 MG tablet Take 100 mg by mouth 2 (two) times daily. 11/16/21  Yes [provider]  paragard intrauterine copper IUD IUD 1 each by Intrauterine route once.    [provider]  promethazine (PHENERGAN) 12.5 MG tablet Take 1 tablet (12.5 mg total) by mouth every 6 (six) hours as needed for nausea or vomiting. Patient not taking: Reported on 04/29/2019 07/05/16 12/03/19  Rubie Maid, MD    Family History Family History  Problem Relation Age of Onset   Diabetes Mother    Hypertension Mother    Hypertension Sister    Stroke  Maternal Grandfather     Social History Social History   Tobacco Use   Smoking status: Former    Packs/day: 0.25    Types: Cigarettes   Smokeless tobacco: Never  Vaping Use   Vaping Use: Every day   Substances: Nicotine, Flavoring  Substance Use Topics   Alcohol use: No    Alcohol/week: 0.0 standard drinks of alcohol   Drug use: No     Allergies   Cashew nut oil   Review of Systems Review of Systems As per HPI  Physical Exam Triage Vital Signs ED Triage Vitals  Enc Vitals Group     BP 01/05/22 1225 (!) 145/98     Pulse Rate 01/05/22 1225 75     Resp --      Temp 01/05/22 1225 97.6 F (36.4 C)     Temp Source 01/05/22 1225 Tympanic     SpO2 01/05/22 1225 100 %     Weight 01/05/22 1224 (!) 318 lb (144.2 kg)     Height 01/05/22 1224 5\' 5"  (1.651 m)     Head Circumference --      Peak Flow --      Pain Score 01/05/22 1223 10     Pain Loc --      Pain Edu? --      Excl. in GC? --    No data  found.  Updated Vital Signs BP (!) 145/98 (BP Location: Left Wrist)   Pulse 75   Temp 97.6 F (36.4 C) (Tympanic)   Ht 5\' 5"  (1.651 m)   Wt (!) 318 lb (144.2 kg)   SpO2 100%   BMI 52.92 kg/m   Visual Acuity Right Eye Distance:   Left Eye Distance:   Bilateral Distance:    Right Eye Near:   Left Eye Near:    Bilateral Near:     Physical Exam Vitals and nursing note reviewed.  Constitutional:      General: She is not in acute distress.    Appearance: Normal appearance. She is obese. She is not ill-appearing, toxic-appearing or diaphoretic.  HENT:     Head: Normocephalic.  Cardiovascular:     Pulses: Normal pulses.  Pulmonary:     Effort: Pulmonary effort is normal. No respiratory distress.  Musculoskeletal:        General: Tenderness (bilateral heels) present. No swelling. Normal range of motion.  Skin:    General: Skin is warm and dry.     Capillary Refill: Capillary refill takes less than 2 seconds.     Findings: No erythema.     Comments: Bilateral heels with hyperkeratosis, significant cracking/ fissures Pes planus bilaterally  Neurological:     General: No focal deficit present.     Mental Status: She is alert and oriented to person, place, and time.     Sensory: No sensory deficit.      UC Treatments / Results  Labs (all labs ordered are listed, but only abnormal results are displayed) Labs Reviewed - No data to display  EKG   Radiology No results found.  Procedures Procedures (including critical care time)  Medications Ordered in UC Medications - No data to display  Initial Impression / Assessment and Plan / UC Course  I have reviewed the triage vital signs and the nursing notes.  Pertinent labs & imaging results that were available during my care of the patient were reviewed by me and considered in my medical decision making (see chart for details).  Palmoplantar hyperkeratosis - refill urea, however will also add salicylic acid.  Recommended pt f/u with derm for further recommendations. Pes planus - need proper podiatry eval. Custom orthotics may also help DM - follow up with PCP for recheck. Ask for metformin ER to alleviate GI symptoms, tight glycemic control will aid in prevention of DM foot complaints   Final Clinical Impressions(s) / UC Diagnoses   Final diagnoses:  Palmoplantar hyperkeratosis  Pes planus of both feet  Diabetes mellitus due to underlying condition with other skin complications [facility])     Discharge Instructions      Please start using the topical cream prescribed today, twice daily to the affected areas of both heels. Go to [facility] and get a foot analysis Follow-up with the podiatrist for possible custom orthotics to help with your flatfeet. Follow-up with your primary care physician to discuss getting on metformin ER, this will reduce the GI side effects.  Proper glucose control is imperative to help with foot healing.   ED Prescriptions     Medication Sig Dispense Auth. Provider   Salicylic Acid-Urea (UREA-SALICYLIC ACID) 2-39.5 % CREA Apply 1 Application topically in the morning and at bedtime. 227 g Shakeda Pearse L, [place]      PDMP not reviewed this encounter.   Maretta Bees, [place] 01/05/22 1425

## 2022-01-30 ENCOUNTER — Ambulatory Visit: Payer: Self-pay

## 2022-03-13 ENCOUNTER — Ambulatory Visit
Admission: RE | Admit: 2022-03-13 | Discharge: 2022-03-13 | Disposition: A | Discharge status: Home / Self Care | Payer: Medicaid Other | Attending: Family Medicine | Admitting: Family Medicine

## 2022-03-13 ENCOUNTER — Other Ambulatory Visit: Payer: Self-pay | Admitting: Family Medicine

## 2022-03-13 ENCOUNTER — Ambulatory Visit
Admission: RE | Admit: 2022-03-13 | Discharge: 2022-03-13 | Disposition: A | Discharge status: Home / Self Care | Payer: Medicaid Other | Source: Ambulatory Visit | Attending: Family Medicine | Admitting: Family Medicine

## 2022-03-13 DIAGNOSIS — M25562 Pain in left knee: Secondary | ICD-10-CM | POA: Insufficient documentation

## 2022-03-13 DIAGNOSIS — M25462 Effusion, left knee: Secondary | ICD-10-CM | POA: Insufficient documentation

## 2022-06-20 ENCOUNTER — Ambulatory Visit: Admit: 2022-06-20 | Discharge status: Against Medical Advice | Payer: Medicaid Other

## 2022-10-05 ENCOUNTER — Ambulatory Visit (INDEPENDENT_AMBULATORY_CARE_PROVIDER_SITE_OTHER): Payer: Medicaid Other

## 2022-10-05 ENCOUNTER — Ambulatory Visit
Admission: RE | Admit: 2022-10-05 | Discharge: 2022-10-05 | Disposition: A | Discharge status: Home / Self Care | Payer: Medicaid Other | Source: Ambulatory Visit | Attending: Physician Assistant | Admitting: Physician Assistant

## 2022-10-05 VITALS — BP 137/87 | HR 100 | Temp 98.2°F | Resp 16 | Ht 65.0 in | Wt 317.9 lb

## 2022-10-05 DIAGNOSIS — R051 Acute cough: Secondary | ICD-10-CM

## 2022-10-05 DIAGNOSIS — J189 Pneumonia, unspecified organism: Secondary | ICD-10-CM | POA: Diagnosis not present

## 2022-10-05 DIAGNOSIS — R6883 Chills (without fever): Secondary | ICD-10-CM

## 2022-10-05 DIAGNOSIS — R509 Fever, unspecified: Secondary | ICD-10-CM | POA: Diagnosis not present

## 2022-10-05 DIAGNOSIS — R059 Cough, unspecified: Secondary | ICD-10-CM

## 2022-10-05 DIAGNOSIS — R0989 Other specified symptoms and signs involving the circulatory and respiratory systems: Secondary | ICD-10-CM | POA: Diagnosis not present

## 2022-10-05 MED ORDER — AZITHROMYCIN 250 MG PO TABS: 250.0000 mg | ORAL_TABLET | Freq: Every day | ORAL | 0 refills | Status: DC

## 2022-10-05 MED ORDER — AMOXICILLIN-POT CLAVULANATE 875-125 MG PO TABS: 1.0000 | ORAL_TABLET | Freq: Two times a day (BID) | ORAL | 0 refills | Status: AC

## 2022-10-05 NOTE — Discharge Instructions (Addendum)
-  Your x-ray shows you have a small pneumonia.  I sent antibiotics to pharmacy.  Take the cough medicine as prescribed by your PCP as well.  Increase rest and fluids. - Follow-up with PCP in about 4 weeks to have repeat x-ray to make sure the pneumonia is gone. - If at any point you have uncontrolled fever, weakness or shortness of breath, go to ER.

## 2022-10-05 NOTE — ED Provider Notes (Signed)
[facility]    CSN: [id number] Arrival date & time: 10/05/22  1801      History   Chief Complaint Chief Complaint  Patient presents with   Chills    Appointment    HPI Latasha FRIEDERICHS is a 32 y.o. female presenting for 5 day history of cough, congestion and low back pain. Also reports fever up to 101 degrees on the first day but it has been about 99 degrees over the past 2 days.  Denies sore throat, ear pain, sinus pain, chest pain, shortness of breath, abdominal pain, change in BMs, painful urination.  Has been taking Mucinex and Alka-Seltzer as well as ibuprofen.  Denies any sick contacts.  Patient had telemedicine appointment on 4/10 and diagnosed with viral URI.  She was seen the following day on 4/11 for complaints of low back pain, fever, cough and congestion.  Patient declined to have any viral testing performed.  Urinalysis was normal and pregnancy test was negative.  She was again diagnosed with viral URI.  She returns today on 4/12 for the same symptoms.  Patient concerned because her fever has not gone away.  Also, has not started the Promethazine DM because she was waiting for the Mucinex to get out of her system.  No other concerns.  HPI  Past Medical History:  Diagnosis Date   Anxiety    COVID-19 2021   Depression    Diabetes mellitus without complication    GERD (gastroesophageal reflux disease)    Heart murmur    Heartburn    Hypercholesterolemia    OCD (obsessive compulsive disorder)    Panic attacks    PONV (postoperative nausea and vomiting)    after cesarean    Patient Active Problem List   Diagnosis Date Noted   OCD (obsessive compulsive disorder) 07/17/2016   Severe recurrent major depression without psychotic features 07/16/2016   Anxiety and depression 07/08/2016   Morbid obesity with BMI of 45.0-49.9, adult 06/06/2016    Past Surgical History:  Procedure Laterality Date   CESAREAN SECTION     none      OB History      Gravida  2   Para  1   Term  1   Preterm      AB  1   Living  1      SAB      IAB      Ectopic      Multiple      Live Births               Home Medications    Prior to Admission medications   Medication Sig Start Date End Date Taking? Authorizing Provider  amoxicillin-clavulanate (AUGMENTIN) 875-125 MG tablet Take 1 tablet by mouth every 12 (twelve) hours for 7 days. 10/05/22 10/12/22 Yes Shirlee Latch, PA-C  azithromycin (ZITHROMAX) 250 MG tablet Take 1 tablet (250 mg total) by mouth daily. Take first 2 tablets together, then 1 every day until finished. 10/05/22  Yes Eusebio Friendly B, PA-C  ABILIFY 10 MG tablet Take 10 mg by mouth daily. 12/29/21   [provider]  paragard intrauterine copper IUD IUD 1 each by Intrauterine route once.    [provider]  Salicylic Acid-Urea (UREA-SALICYLIC ACID) 2-39.5 % CREA Apply 1 Application topically in the morning and at bedtime. 01/05/22   Crain, Whitney L, PA  sertraline (ZOLOFT) 100 MG tablet Take 100 mg by mouth 2 (two) times daily. 11/16/21  [provider]  promethazine (PHENERGAN) 12.5 MG tablet Take 1 tablet (12.5 mg total) by mouth every 6 (six) hours as needed for nausea or vomiting. Patient not taking: Reported on 04/29/2019 07/05/16 12/03/19  Hildred Laser, MD    Family History Family History  Problem Relation Age of Onset   Diabetes Mother    Hypertension Mother    Hypertension Sister    Stroke Maternal Grandfather     Social History Social History   Tobacco Use   Smoking status: Former    Packs/day: .25    Types: Cigarettes   Smokeless tobacco: Never  Vaping Use   Vaping Use: Every day   Substances: Nicotine, Flavoring  Substance Use Topics   Alcohol use: No    Alcohol/week: 0.0 standard drinks of alcohol   Drug use: No     Allergies   Cashew nut oil   Review of Systems Review of Systems  Constitutional:  Positive for fever. Negative for chills, diaphoresis and  fatigue.  HENT:  Positive for congestion and rhinorrhea. Negative for ear pain, sinus pressure, sinus pain and sore throat.   Respiratory:  Positive for cough. Negative for shortness of breath.   Cardiovascular:  Negative for chest pain.  Gastrointestinal:  Negative for abdominal pain, nausea and vomiting.  Genitourinary:  Negative for difficulty urinating, dysuria and frequency.  Musculoskeletal:  Positive for back pain. Negative for arthralgias and myalgias.  Skin:  Negative for rash.  Neurological:  Negative for weakness and headaches.  Hematological:  Negative for adenopathy.     Physical Exam Triage Vital Signs ED Triage Vitals  Enc Vitals Group     BP      Pulse      Resp      Temp      Temp src      SpO2      Weight      Height      Head Circumference      Peak Flow      Pain Score      Pain Loc      Pain Edu?      Excl. in GC?    No data found.  Updated Vital Signs BP 137/87 (BP Location: Right Arm)   Pulse 100   Temp 98.2 F (36.8 C) (Oral)   Resp 16   Ht  (1.651 m)   Wt (!) 317 lb 14.5 oz (144.2 kg)   SpO2 95%   BMI 52.90 kg/m   Physical Exam Vitals and nursing note reviewed.  Constitutional:      General: She is not in acute distress.    Appearance: Normal appearance. She is not ill-appearing or toxic-appearing.  HENT:     Head: Normocephalic and atraumatic.     Right Ear: Tympanic membrane, ear canal and external ear normal.     Left Ear: Tympanic membrane, ear canal and external ear normal.     Nose: Congestion present.     Mouth/Throat:     Mouth: Mucous membranes are moist.     Pharynx: Oropharynx is clear. Posterior oropharyngeal erythema present.  Eyes:     General: No scleral icterus.       Right eye: No discharge.        Left eye: No discharge.     Conjunctiva/sclera: Conjunctivae normal.  Cardiovascular:     Rate and Rhythm: Normal rate and regular rhythm.     Heart sounds: Normal heart sounds.  Pulmonary:  Effort:  Pulmonary effort is normal. No respiratory distress.     Breath sounds: Wheezing (scant inspiratory wheezes throught right lung) present.  Abdominal:     Palpations: Abdomen is soft.     Tenderness: There is no abdominal tenderness. There is no right CVA tenderness or left CVA tenderness.  Musculoskeletal:     Cervical back: Neck supple.  Skin:    General: Skin is dry.  Neurological:     General: No focal deficit present.     Mental Status: She is alert. Mental status is at baseline.     Motor: No weakness.     Gait: Gait normal.  Psychiatric:        Mood and Affect: Mood normal.        Behavior: Behavior normal.        Thought Content: Thought content normal.      UC Treatments / Results  Labs (all labs ordered are listed, but only abnormal results are displayed) Labs Reviewed - No data to display  EKG   Radiology DG Chest 2 View  Result Date: 10/05/2022 CLINICAL DATA:  Cough congestion EXAM: CHEST - 2 VIEW COMPARISON:  09/04/2011 FINDINGS: Left lower lobe pneumonia. No pleural effusion. Normal cardiac size. No pneumothorax IMPRESSION: Left lower lobe pneumonia. Electronically Signed   By: Jasmine Pang M.D.   On: 10/05/2022 18:45    Procedures Procedures (including critical care time)  Medications Ordered in UC Medications - No data to display  Initial Impression / Assessment and Plan / UC Course  I have reviewed the triage vital signs and the nursing notes.  Pertinent labs & imaging results that were available during my care of the patient were reviewed by me and considered in my medical decision making (see chart for details).   32 year old female presents for 5-day history of cough and congestion as well as fever up to 101 degrees.  Temps have been up to 99 degrees in the past 2 days.  Reports concern for continued symptoms.  Has been seen via telemedicine provider 2 days ago and then yesterday by PCP.  Patient concerned because symptoms persist she did have a  normal urinalysis yesterday and a negative pregnancy test.  This was checked due to her complaint of low back pain.  She declined respiratory swabs.  Vitals are all normal and stable and she is overall well-appearing.  No acute distress.  On exam she has nasal congestion and mild erythema posterior pharynx.  Also scant inspiratory wheezes throughout the right lung.  Chest x-ray obtained.  X-ray shows left lower lobe pneumonia.  Discussed with patient.  Will treat at this time with Augmentin and azithromycin.  Will have her take the Promethazine DM as prescribed by PCP yesterday.  Increase rest and fluids.  Follow-up with PCP in about 4 weeks for repeat x-ray.  Discussed ED precautions.  Work note given.   Final Clinical Impressions(s) / UC Diagnoses   Final diagnoses:  Community acquired pneumonia of left lower lobe of lung  Chills  Fever, unspecified  Acute cough     Discharge Instructions      -Your x-ray shows you have a small pneumonia.  I sent antibiotics to pharmacy.  Take the cough medicine as prescribed by your PCP as well.  Increase rest and fluids. - Follow-up with PCP in about 4 weeks to have repeat x-ray to make sure the pneumonia is gone. - If at any point you have uncontrolled fever, weakness or shortness of breath,  go to ER.     ED Prescriptions     Medication Sig Dispense Auth. Provider   amoxicillin-clavulanate (AUGMENTIN) 875-125 MG tablet Take 1 tablet by mouth every 12 (twelve) hours for 7 days. 14 tablet Eusebio Friendly B, PA-C   azithromycin (ZITHROMAX) 250 MG tablet Take 1 tablet (250 mg total) by mouth daily. Take first 2 tablets together, then 1 every day until finished. 6 tablet Gareth Morgan      PDMP not reviewed this encounter.   Shirlee Latch, PA-C 10/05/22 [phone]

## 2022-10-05 NOTE — ED Triage Notes (Signed)
Patient reports fever off and on for the past 5 days.  Patient reports cough and chest congestion.  Patient has been taking Muccinex.  Patient also takes Ibuprofen for fevers.  Patient saw her PCP for these symptoms yesterday and was given cough medicine.

## 2022-11-12 DIAGNOSIS — Z3202 Encounter for pregnancy test, result negative: Secondary | ICD-10-CM | POA: Diagnosis not present

## 2022-11-12 DIAGNOSIS — Z309 Encounter for contraceptive management, unspecified: Secondary | ICD-10-CM | POA: Diagnosis not present

## 2022-12-10 ENCOUNTER — Ambulatory Visit
Admission: EM | Admit: 2022-12-10 | Discharge: 2022-12-10 | Disposition: A | Discharge status: Home / Self Care | Payer: Medicaid Other | Attending: Family | Admitting: Family

## 2022-12-10 DIAGNOSIS — J039 Acute tonsillitis, unspecified: Secondary | ICD-10-CM | POA: Insufficient documentation

## 2022-12-10 DIAGNOSIS — R590 Localized enlarged lymph nodes: Secondary | ICD-10-CM | POA: Insufficient documentation

## 2022-12-10 DIAGNOSIS — J029 Acute pharyngitis, unspecified: Secondary | ICD-10-CM | POA: Insufficient documentation

## 2022-12-10 LAB — GROUP A STREP BY PCR: Group A Strep by PCR: NOT DETECTED

## 2022-12-10 MED ORDER — PREDNISONE 20 MG PO TABS: 40.0000 mg | ORAL_TABLET | Freq: Every day | ORAL | 0 refills | Status: AC

## 2022-12-10 MED ORDER — AMOXICILLIN-POT CLAVULANATE 875-125 MG PO TABS: 1.0000 | ORAL_TABLET | Freq: Two times a day (BID) | ORAL | 0 refills | Status: AC

## 2022-12-10 NOTE — Discharge Instructions (Signed)
Recommend start Augmentin antibiotic 875mg  twice a day for 7 days- take with food. Start Prednisone (steroid) 40mg  once daily with food for 5 days. May continue OTC Tylenol 1000mg  every 8 hours as needed for pain or fever. Continue to monitor throat pain and swelling- if it gets worse or you can swallow your own spit or if it is not improving within 48 hours, go to the ER ASAP for further evaluation.

## 2022-12-10 NOTE — ED Triage Notes (Signed)
Pt c/o sore throat & fever x1 day. Tmax 99.3. Has tried tylenol & ibuprofen w/o relief.

## 2022-12-10 NOTE — ED Provider Notes (Signed)
[facility]    CSN: [id number] Arrival date & time: 12/10/22  1132      History   Chief Complaint Chief Complaint  Patient presents with   Sore Throat   Fever    HPI Latasha Alexander is a 32 y.o. female.   32 year old female presents with right swollen tonsil for the past 3 to 4 days. Started developing a sore throat 2 days ago and now a fever this morning. Throat pain and swelling is getting worse but still able to swallow her saliva and other fluids. Also experiencing a headache, body aches, chills but denies any nasal congestion, cough or vomiting/diarrhea. She had a video visit about 10 days ago (on 11/28/2022) for sore throat and possible exposure to strep (her son had strep) and they prescribed Amoxicillin for possible tonsillitis but sore throat improved on own and she never filled the Amoxicillin. No new exposure to strep throat. Did have strep and tonsillitis multiple times as a young child but not recently. Former smoker. She has taken OTC Ibuprofen and Tylenol with minimal relief. Other chronic health issues include anxiety and depression. Currently on Abilify and Zoloft daily.   The history is provided by the patient and a parent.    Past Medical History:  Diagnosis Date   Anxiety    COVID-19 2021   Depression    Diabetes mellitus without complication (HCC)    GERD (gastroesophageal reflux disease)    Heart murmur    Heartburn    Hypercholesterolemia    OCD (obsessive compulsive disorder)    Panic attacks    PONV (postoperative nausea and vomiting)    after cesarean    Patient Active Problem List   Diagnosis Date Noted   OCD (obsessive compulsive disorder) 07/17/2016   Severe recurrent major depression without psychotic features (HCC) 07/16/2016   Anxiety and depression 07/08/2016   Morbid obesity with BMI of 45.0-49.9, adult (HCC) 06/06/2016    Past Surgical History:  Procedure Laterality Date   CESAREAN SECTION     none      OB History      Gravida  2   Para  1   Term  1   Preterm      AB  1   Living  1      SAB      IAB      Ectopic      Multiple      Live Births               Home Medications    Prior to Admission medications   Medication Sig Start Date End Date Taking? Authorizing Provider  ABILIFY 10 MG tablet Take 10 mg by mouth daily. 12/29/21  Yes [provider]  amoxicillin-clavulanate (AUGMENTIN) 875-125 MG tablet Take 1 tablet by mouth every 12 (twelve) hours for 7 days. 12/10/22 12/17/22 Yes Coye Dawood, Ali Lowe, NP  paragard intrauterine copper IUD IUD 1 each by Intrauterine route once.   Yes [provider]  predniSONE (DELTASONE) 20 MG tablet Take 2 tablets (40 mg total) by mouth daily for 5 days. 12/10/22 12/15/22 Yes Marceline Napierala, Ali Lowe, NP  Salicylic Acid-Urea (UREA-SALICYLIC ACID) 2-39.5 % CREA Apply 1 Application topically in the morning and at bedtime. 01/05/22  Yes Crain, Whitney L, PA  sertraline (ZOLOFT) 100 MG tablet Take 100 mg by mouth 2 (two) times daily. 11/16/21  Yes [provider]  promethazine (PHENERGAN) 12.5 MG tablet Take 1 tablet (12.5  mg total) by mouth every 6 (six) hours as needed for nausea or vomiting. Patient not taking: Reported on 04/29/2019 07/05/16 12/03/19  Hildred Laser, MD    Family History Family History  Problem Relation Age of Onset   Diabetes Mother    Hypertension Mother    Hypertension Sister    Stroke Maternal Grandfather     Social History Social History   Tobacco Use   Smoking status: Former    Packs/day: .25    Types: Cigarettes   Smokeless tobacco: Never  Vaping Use   Vaping Use: Every day   Substances: Nicotine, Flavoring  Substance Use Topics   Alcohol use: No    Alcohol/week: 0.0 standard drinks of alcohol   Drug use: No     Allergies   Cashew nut oil   Review of Systems Review of Systems  Constitutional:  Positive for activity change, appetite change, chills, fatigue and fever. Negative for  diaphoresis.  HENT:  Positive for sore throat, trouble swallowing and voice change. Negative for congestion, drooling, ear discharge, ear pain, facial swelling, mouth sores, postnasal drip, rhinorrhea, sinus pressure and sinus pain.   Eyes:  Negative for discharge, redness and itching.  Respiratory:  Negative for cough, chest tightness and shortness of breath.   Gastrointestinal:  Positive for nausea. Negative for diarrhea and vomiting.  Musculoskeletal:  Positive for arthralgias, myalgias and neck pain (mainly right side). Negative for neck stiffness.  Skin:  Negative for color change and rash.  Allergic/Immunologic: Positive for food allergies. Negative for environmental allergies.  Neurological:  Positive for headaches. Negative for dizziness, tremors, seizures, syncope, speech difficulty, weakness and numbness.  Hematological:  Negative for adenopathy. Does not bruise/bleed easily.  Psychiatric/Behavioral:  Positive for sleep disturbance.      Physical Exam Triage Vital Signs ED Triage Vitals  Enc Vitals Group     BP 12/10/22 1204 138/85     Pulse Rate 12/10/22 1204 97     Resp 12/10/22 1204 16     Temp 12/10/22 1204 100.2 F (37.9 C)     Temp Source 12/10/22 1204 Oral     SpO2 12/10/22 1204 97 %     Weight 12/10/22 1203 (!) 338 lb (153.3 kg)     Height 12/10/22 1203 5\' 4"  (1.626 m)     Head Circumference --      Peak Flow --      Pain Score 12/10/22 1208 10     Pain Loc --      Pain Edu? --      Excl. in GC? --    No data found.  Updated Vital Signs BP 138/85 (BP Location: Left Wrist)   Pulse 97   Temp 100.2 F (37.9 C) (Oral)   Resp 16   Ht 5\' 4"  (1.626 m)   Wt (!) 338 lb (153.3 kg)   SpO2 97%   BMI 58.02 kg/m   Visual Acuity Right Eye Distance:   Left Eye Distance:   Bilateral Distance:    Right Eye Near:   Left Eye Near:    Bilateral Near:     Physical Exam Vitals and nursing note reviewed.  Constitutional:      General: She is awake. She is not in  acute distress.    Appearance: She is well-developed and overweight. She is ill-appearing.     Comments: She is sitting on the exam table in no acute distress, talking in complete sentences but speech is slightly distorted and she appears in  pain.   HENT:     Head: Normocephalic and atraumatic.     Right Ear: Hearing, tympanic membrane, ear canal and external ear normal.     Left Ear: Hearing, tympanic membrane, ear canal and external ear normal.     Nose: Nose normal. No rhinorrhea.     Right Sinus: No maxillary sinus tenderness or frontal sinus tenderness.     Left Sinus: No maxillary sinus tenderness or frontal sinus tenderness.     Mouth/Throat:     Lips: Pink.     Mouth: Mucous membranes are moist.     Pharynx: Uvula midline. Pharyngeal swelling and posterior oropharyngeal erythema present. No uvula swelling.     Tonsils: Tonsillar exudate present. 4+ on the right. 3+ on the left.      Comments: Right tonsil swollen to midline (not past uvula) with uvula resting on frontal aspect of tonsil. Red with white exudate present. Left tonsil also swollen with slight exudate but less than right. No distinct tonsillar abscess present. No bleeding.  Eyes:     Extraocular Movements: Extraocular movements intact.     Conjunctiva/sclera: Conjunctivae normal.  Neck:     Trachea: Trachea normal.  Cardiovascular:     Rate and Rhythm: Normal rate and regular rhythm.     Heart sounds: Normal heart sounds. No murmur heard. Pulmonary:     Effort: Pulmonary effort is normal. No respiratory distress.     Breath sounds: Normal breath sounds and air entry. No decreased air movement. No decreased breath sounds, wheezing, rhonchi or rales.  Musculoskeletal:     Cervical back: Normal range of motion and neck supple.  Lymphadenopathy:     Cervical: Cervical adenopathy present.     Right cervical: Superficial cervical adenopathy and deep cervical adenopathy present.     Left cervical: No superficial or deep  cervical adenopathy.  Skin:    General: Skin is warm and dry.     Capillary Refill: Capillary refill takes less than 2 seconds.     Findings: No erythema or rash.  Neurological:     General: No focal deficit present.     Mental Status: She is alert and oriented to person, place, and time.  Psychiatric:        Mood and Affect: Mood normal.        Behavior: Behavior normal. Behavior is cooperative.        Thought Content: Thought content normal.      UC Treatments / Results  Labs (all labs ordered are listed, but only abnormal results are displayed) Labs Reviewed  GROUP A STREP BY PCR    EKG   Radiology No results found.  Procedures Procedures (including critical care time)  Medications Ordered in UC Medications - No data to display  Initial Impression / Assessment and Plan / UC Course  I have reviewed the triage vital signs and the nursing notes.  Pertinent labs & imaging results that were available during my care of the patient were reviewed by me and considered in my medical decision making (see chart for details).    Reviewed negative strep test result with patient and Mom.  Discussed with patient and Mom that she appears to have a Tonsillitis- concern over possible tonsillar abscess due to swelling of tonsil to midline and exudate but can not see a distinct abscess. Tonsil is not past midline and uvula is not swollen and she is able to swallow saliva and fluids and vitals are stable so will treat  for Tonsillitis as outpatient at this time with strict precautions to go to the ER ASAP if symptoms worsen or do not improve.  Will start Augmentin 875mg  twice a day for 7 days- take with food. Recommend Prednisone 40mg  daily for 5 days. May continue OTC Tylenol 1000mg  every 8 hours as needed for pain or fever. Continue to monitor throat pain and swelling. May need ENT consult.  If pain or swelling gets worse and uvula is pushed over to the left, go to the ER ASAP. Also if  unable to swallow any liquids including her own saliva, also call 911 and go to the ER ASAP.  If swelling and pain also does not improve within 48 hours, go to the ER for further evaluation and follow-up with her PCP if symptoms do not completely resolve.   Final Clinical Impressions(s) / UC Diagnoses   Final diagnoses:  Acute tonsillitis, unspecified etiology  Lymphadenopathy of right cervical region  Sore throat     Discharge Instructions      Recommend start Augmentin antibiotic 875mg  twice a day for 7 days- take with food. Start Prednisone (steroid) 40mg  once daily with food for 5 days. May continue OTC Tylenol 1000mg  every 8 hours as needed for pain or fever. Continue to monitor throat pain and swelling- if it gets worse or you can swallow your own spit or if it is not improving within 48 hours, go to the ER ASAP for further evaluation.     ED Prescriptions     Medication Sig Dispense Auth. Provider   amoxicillin-clavulanate (AUGMENTIN) 875-125 MG tablet Take 1 tablet by mouth every 12 (twelve) hours for 7 days. 14 tablet Sudie Grumbling, NP   predniSONE (DELTASONE) 20 MG tablet Take 2 tablets (40 mg total) by mouth daily for 5 days. 10 tablet Chalisa Kobler, Ali Lowe, NP      PDMP not reviewed this encounter.   Sudie Grumbling, NP 12/10/22 1941

## 2023-04-05 ENCOUNTER — Ambulatory Visit
Admission: RE | Admit: 2023-04-05 | Discharge: 2023-04-05 | Disposition: A | Discharge status: Home / Self Care | Payer: Medicaid Other | Source: Ambulatory Visit

## 2023-04-05 VITALS — BP 138/79 | HR 79 | Temp 97.9°F | Resp 17

## 2023-04-05 DIAGNOSIS — A084 Viral intestinal infection, unspecified: Secondary | ICD-10-CM | POA: Diagnosis not present

## 2023-04-05 DIAGNOSIS — R197 Diarrhea, unspecified: Secondary | ICD-10-CM

## 2023-04-05 DIAGNOSIS — R112 Nausea with vomiting, unspecified: Secondary | ICD-10-CM

## 2023-04-05 MED ORDER — ONDANSETRON 4 MG PO TBDP: 4.0000 mg | ORAL_TABLET | Freq: Three times a day (TID) | ORAL | 0 refills | Status: AC | PRN

## 2023-04-05 NOTE — Discharge Instructions (Addendum)
ABDOMINAL PAIN: You may take Tylenol for pain relief. Use medications as directed including antiemetics and antidiarrheal medications if suggested or prescribed. You should increase fluids and electrolytes as well as rest over these next several days. If you have any questions or concerns, or if your symptoms are not improving or if especially if they acutely worsen, please call or stop back to the clinic immediately and we will be happy to help you or go to the ER   ABDOMINAL PAIN RED FLAGS: Seek immediate further care if: symptoms remain the same or worsen over the next 3-7 days, you are unable to keep fluids down, you see blood or mucus in your stool, you vomit black or dark red material, you have a fever of 101.F or higher, you have localized and/or persistent abdominal pain   

## 2023-04-05 NOTE — ED Provider Notes (Signed)
[facility]    CSN: [id number] Arrival date & time: 04/05/23  1148      History   Chief Complaint Chief Complaint  Patient presents with   Diarrhea    Entered by patient   Emesis    HPI Latasha Alexander is a 32 y.o. female presenting for 3-day history of nausea with couple episodes of vomiting, multiple episodes of diarrhea and abdominal cramping before diarrhea.  Reports fecal urgency.  No fecal incontinence, black or bloody stool.  No fever or weakness.  States her child has been ill with similar symptoms last week.  She has missed work for the past couple of days and will need a note to return to work.  She reports feeling better today but still feeling like she needs a couple more days off.  Has been taking Pepto-Bismol which has been helpful.  HPI  Past Medical History:  Diagnosis Date   Anxiety    COVID-19 2021   Depression    Diabetes mellitus without complication (HCC)    GERD (gastroesophageal reflux disease)    Heart murmur    Heartburn    Hypercholesterolemia    OCD (obsessive compulsive disorder)    Panic attacks    PONV (postoperative nausea and vomiting)    after cesarean    Patient Active Problem List   Diagnosis Date Noted   OCD (obsessive compulsive disorder) 07/17/2016   Severe recurrent major depression without psychotic features (HCC) 07/16/2016   Anxiety and depression 07/08/2016   Morbid obesity with BMI of 45.0-49.9, adult (HCC) 06/06/2016    Past Surgical History:  Procedure Laterality Date   CESAREAN SECTION     none      OB History     Gravida  2   Para  1   Term  1   Preterm      AB  1   Living  1      SAB      IAB      Ectopic      Multiple      Live Births               Home Medications    Prior to Admission medications   Medication Sig Start Date End Date Taking? Authorizing Provider  ABILIFY 10 MG tablet Take 10 mg by mouth daily. 12/29/21  Yes [provider]  ondansetron  (ZOFRAN-ODT) 4 MG disintegrating tablet Take 1 tablet (4 mg total) by mouth every 8 (eight) hours as needed. 04/05/23  Yes Eusebio Friendly B, PA-C  OZEMPIC, 0.25 OR 0.5 MG/DOSE, 2 MG/3ML SOPN Inject 0.5 mg into the skin once a week.   Yes [provider]  paragard intrauterine copper IUD IUD 1 each by Intrauterine route once.   Yes [provider]  Salicylic Acid-Urea (UREA-SALICYLIC ACID) 2-39.5 % CREA Apply 1 Application topically in the morning and at bedtime. 01/05/22  Yes Crain, Whitney L, PA  sertraline (ZOLOFT) 100 MG tablet Take 100 mg by mouth 2 (two) times daily. 11/16/21  Yes [provider]  promethazine (PHENERGAN) 12.5 MG tablet Take 1 tablet (12.5 mg total) by mouth every 6 (six) hours as needed for nausea or vomiting. Patient not taking: Reported on 04/29/2019 07/05/16 12/03/19  Hildred Laser, MD    Family History Family History  Problem Relation Age of Onset   Diabetes Mother    Hypertension Mother    Hypertension Sister    Stroke Maternal Grandfather  Social History Social History   Tobacco Use   Smoking status: Former    Current packs/day: 0.25    Types: Cigarettes   Smokeless tobacco: Never  Vaping Use   Vaping status: Every Day   Substances: Nicotine, Flavoring  Substance Use Topics   Alcohol use: No    Alcohol/week: 0.0 standard drinks of alcohol   Drug use: No     Allergies   Cashew nut oil   Review of Systems Review of Systems  Constitutional:  Positive for appetite change. Negative for fatigue and fever.  HENT:  Negative for congestion.   Respiratory:  Negative for cough and shortness of breath.   Cardiovascular:  Negative for chest pain.  Gastrointestinal:  Positive for abdominal pain, diarrhea, nausea and vomiting. Negative for abdominal distention and blood in stool.  Genitourinary:  Negative for difficulty urinating and dysuria.  Musculoskeletal:  Negative for back pain.  Neurological:  Negative for weakness.      Physical Exam Triage Vital Signs ED Triage Vitals  Encounter Vitals Group     BP      Systolic BP Percentile      Diastolic BP Percentile      Pulse      Resp      Temp      Temp src      SpO2      Weight      Height      Head Circumference      Peak Flow      Pain Score      Pain Loc      Pain Education      Exclude from Growth Chart    No data found.  Updated Vital Signs BP 138/79 (BP Location: Right Wrist)   Pulse 79   Temp 97.9 F (36.6 C) (Oral)   Resp 17   SpO2 96%   Physical Exam Vitals and nursing note reviewed.  Constitutional:      General: She is not in acute distress.    Appearance: Normal appearance. She is not ill-appearing or toxic-appearing.  HENT:     Head: Normocephalic and atraumatic.     Nose: Nose normal.     Mouth/Throat:     Mouth: Mucous membranes are moist.     Pharynx: Oropharynx is clear.  Eyes:     General: No scleral icterus.       Right eye: No discharge.        Left eye: No discharge.     Conjunctiva/sclera: Conjunctivae normal.  Cardiovascular:     Rate and Rhythm: Normal rate and regular rhythm.     Heart sounds: Normal heart sounds.  Pulmonary:     Effort: Pulmonary effort is normal. No respiratory distress.     Breath sounds: Normal breath sounds.  Abdominal:     Palpations: Abdomen is soft.     Tenderness: There is abdominal tenderness (mild generalized). There is no right CVA tenderness, left CVA tenderness or guarding.  Musculoskeletal:     Cervical back: Neck supple.  Skin:    General: Skin is dry.  Neurological:     General: No focal deficit present.     Mental Status: She is alert. Mental status is at baseline.     Motor: No weakness.     Gait: Gait normal.  Psychiatric:        Mood and Affect: Mood normal.        Behavior: Behavior normal.  Thought Content: Thought content normal.      UC Treatments / Results  Labs (all labs ordered are listed, but only abnormal results are  displayed) Labs Reviewed - No data to display  EKG   Radiology No results found.  Procedures Procedures (including critical care time)  Medications Ordered in UC Medications - No data to display  Initial Impression / Assessment and Plan / UC Course  I have reviewed the triage vital signs and the nursing notes.  Pertinent labs & imaging results that were available during my care of the patient were reviewed by me and considered in my medical decision making (see chart for details).   32 year old female presents for abdominal cramping, nausea with 3 notes of vomiting and multiple episodes of diarrhea per day x 3 days.  Child recently had similar symptoms.  Patient has not had any fever or severe abdominal pain and symptoms are improving from onset.  Has been taking Pepto-Bismol.  Requests a work note.  Vitals normal and stable and she is overall well-appearing.  Moist mucous membranes.  Chest clear to auscultation and heart regular rate and rhythm.  Abdomen soft with mild generalized tenderness.  Suspect viral gastroenteritis.  Supportive care encouraged.  Encouraged increased rest and fluids.  Sent Zofran to pharmacy and advised her to try Imodium.  Reviewed that she should be feeling better in a couple more days which she develops a fever, worsening abdominal pain, signs of dehydration or weakness she is to go to the emergency department.  Work note provided.   Final Clinical Impressions(s) / UC Diagnoses   Final diagnoses:  Viral gastroenteritis  Nausea vomiting and diarrhea     Discharge Instructions      ABDOMINAL PAIN: You may take Tylenol for pain relief. Use medications as directed including antiemetics and antidiarrheal medications if suggested or prescribed. You should increase fluids and electrolytes as well as rest over these next several days. If you have any questions or concerns, or if your symptoms are not improving or if especially if they acutely worsen, please  call or stop back to the clinic immediately and we will be happy to help you or go to the ER   ABDOMINAL PAIN RED FLAGS: Seek immediate further care if: symptoms remain the same or worsen over the next 3-7 days, you are unable to keep fluids down, you see blood or mucus in your stool, you vomit black or dark red material, you have a fever of 101.F or higher, you have localized and/or persistent abdominal pain       ED Prescriptions     Medication Sig Dispense Auth. Provider   ondansetron (ZOFRAN-ODT) 4 MG disintegrating tablet Take 1 tablet (4 mg total) by mouth every 8 (eight) hours as needed. 15 tablet Gareth Morgan      PDMP not reviewed this encounter.   Shirlee Latch, PA-C 04/05/23 1321

## 2023-04-05 NOTE — ED Triage Notes (Addendum)
Patient states that she has been vomiting and diarrhea x 3 days. She's been having heart burn that she feels is making her sick. LMP around the middle of last month. She is unsure of date. She said its time for it to start. Patient states that she started taking Ozempic 3 weeks ago  Patient stated that she is feeling better. She needs a dr note because she's been out of work for 3 days.

## 2023-07-18 IMAGING — CT CT NECK W/ CM
5 series · 16 of 35 positions shown, 18 images · IV contrast (omnipaque)
Comparison: None.

CLINICAL DATA: Epiglottitis or tonsillitis suspected, PTA suspected

EXAM:
CT NECK WITH CONTRAST
TECHNIQUE: Multidetector CT imaging of the neck was performed using the
standard protocol following the bolus administration of intravenous
contrast.
CONTRAST:  80mL OMNIPAQUE IOHEXOL 300 MG/ML  SOLN

[Series 2: axial neck · axial · 0.47mm/px · z∈[-211,-95]mm · 3 of 117 slices shown]
[im 30/117  bone]
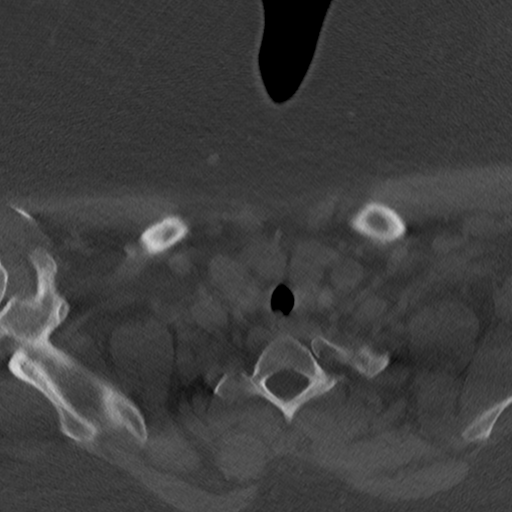
[im 59/117  bone]
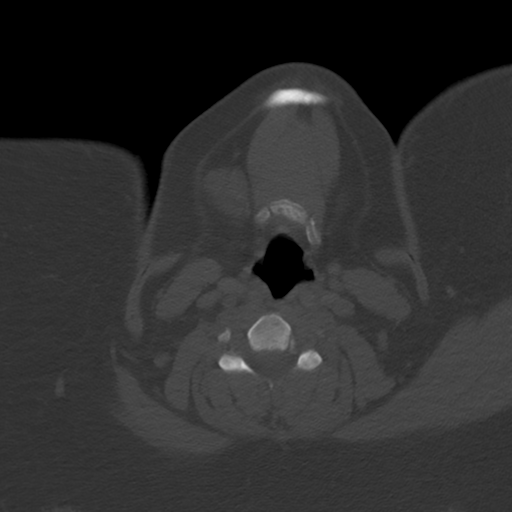
[im 88/117  bone]
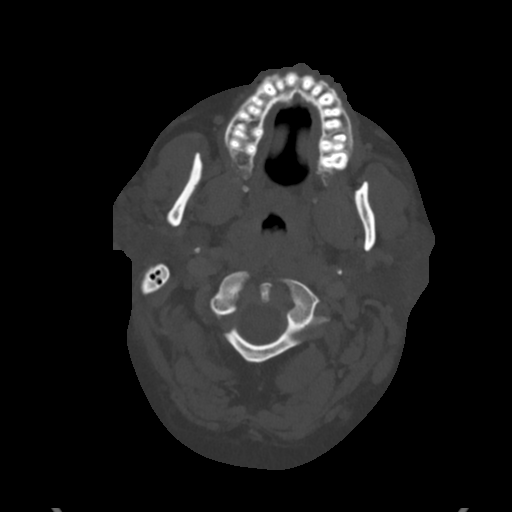

[Series 4: axial bone · axial · 0.47mm/px · z∈[-193,-115]mm · 2 of 117 slices shown]
[im 39/117  bone]
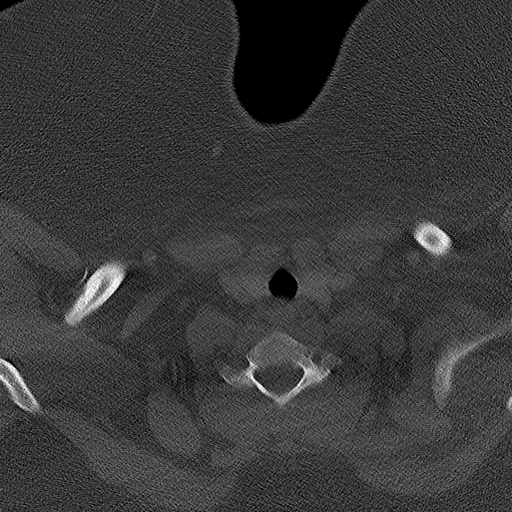
[im 78/117  bone]
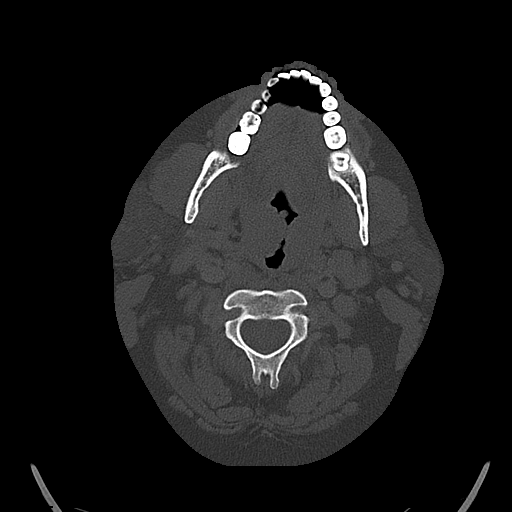

[Series 5: sag neck · sagittal · 0.48mm/px · 5 of 125 slices shown, 6 images]
[im 42/125  bone]
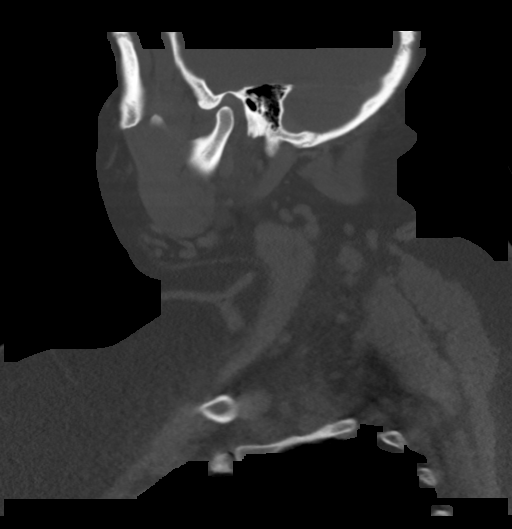
[im 52/125  bone]
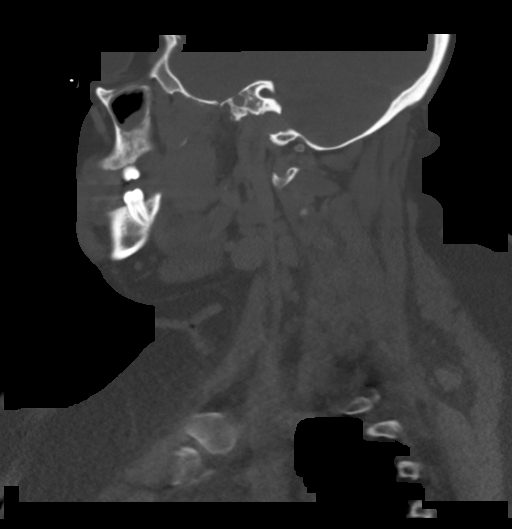
[im 63/125  soft-tissue]
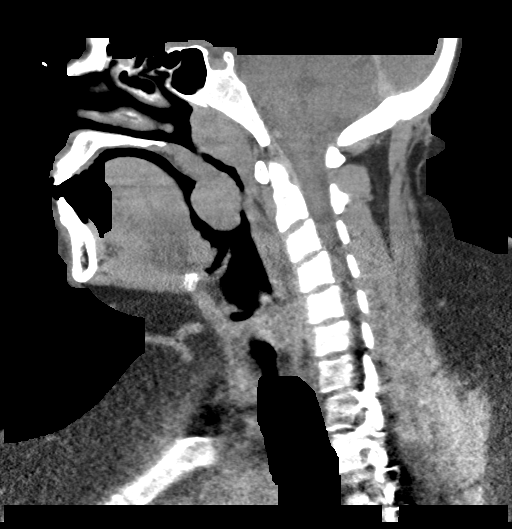
[im 63/125  bone]
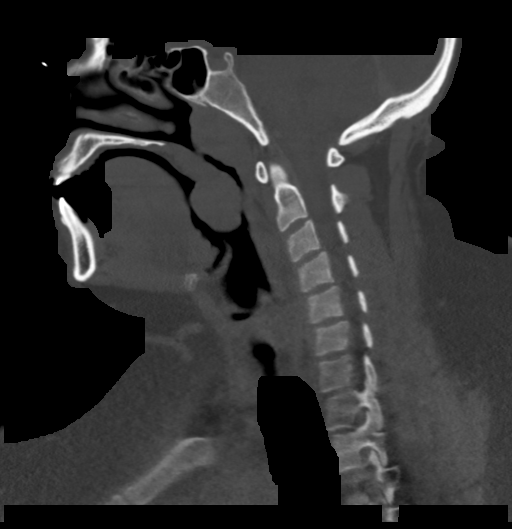
[im 73/125  bone]
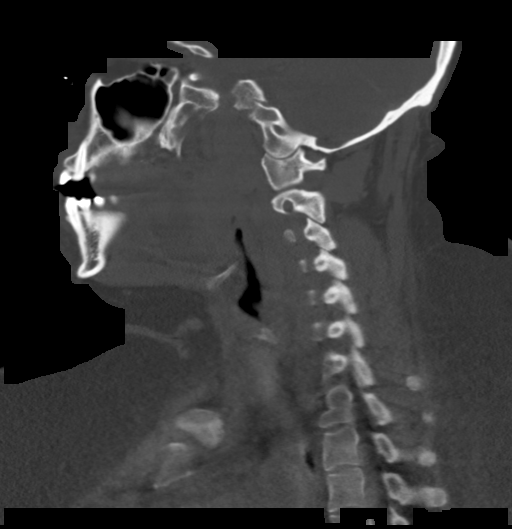
[im 83/125  bone]
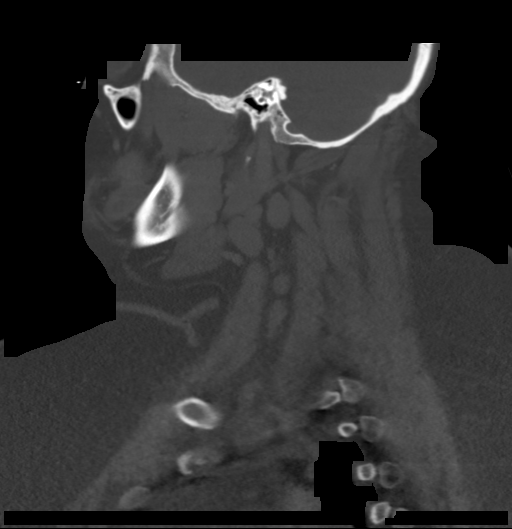

[Series 6: cor neck · coronal · 0.44mm/px · 3 of 128 slices shown]
[im 26/128  bone]
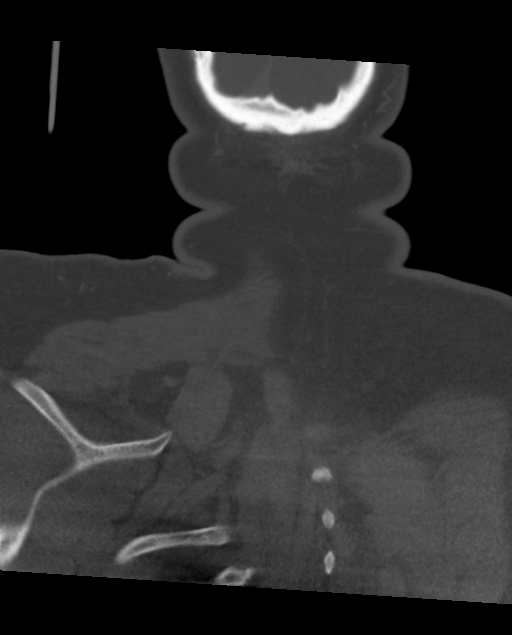
[im 51/128  bone]
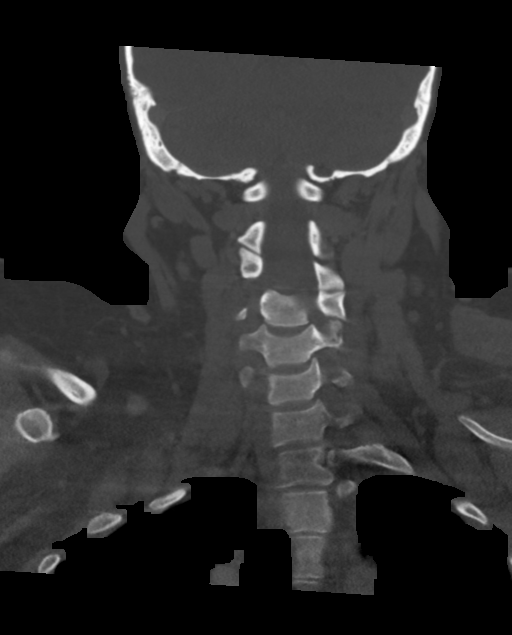
[im 77/128  bone]
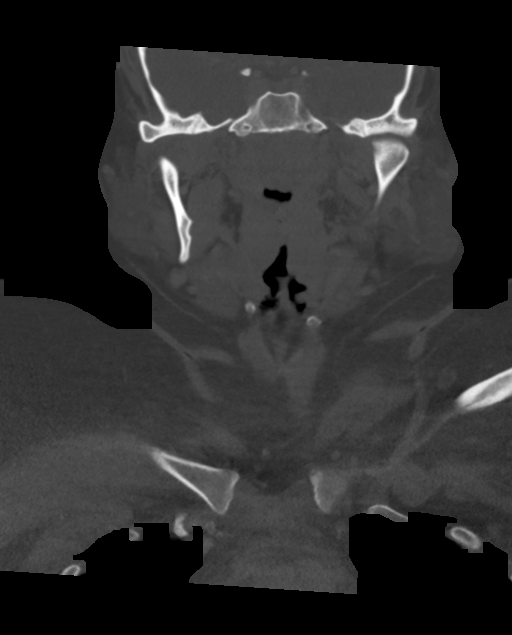

[Series 7: orthogonal (person_name) · axial · 0.46mm/px · z∈[-241,-107]mm · 3 of 136 slices shown, 4 images]
[im 34/136  soft-tissue]
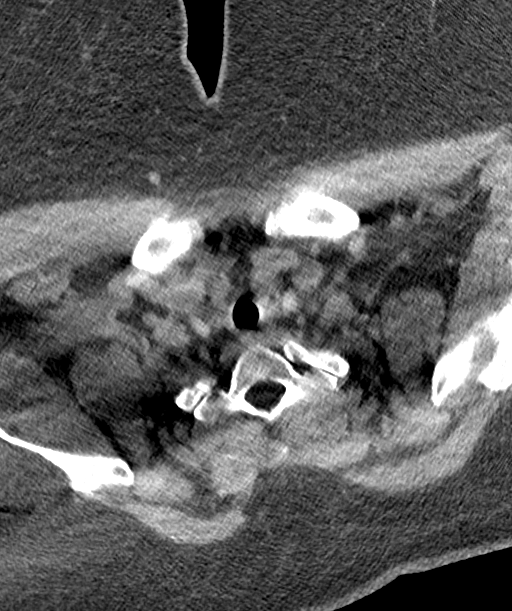
[im 34/136  bone]
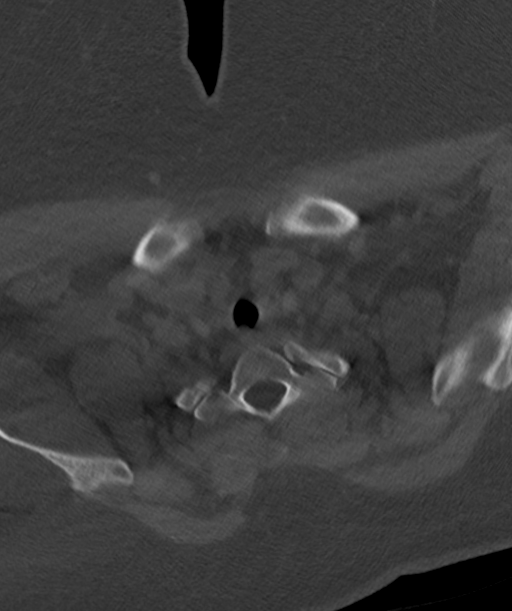
[im 68/136  bone]
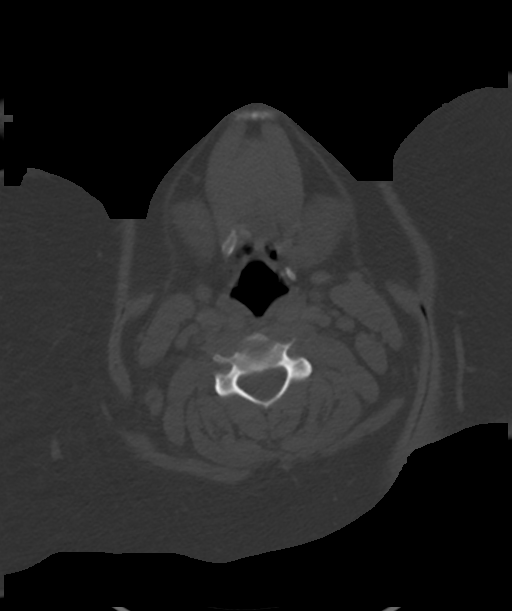
[im 102/136  bone]
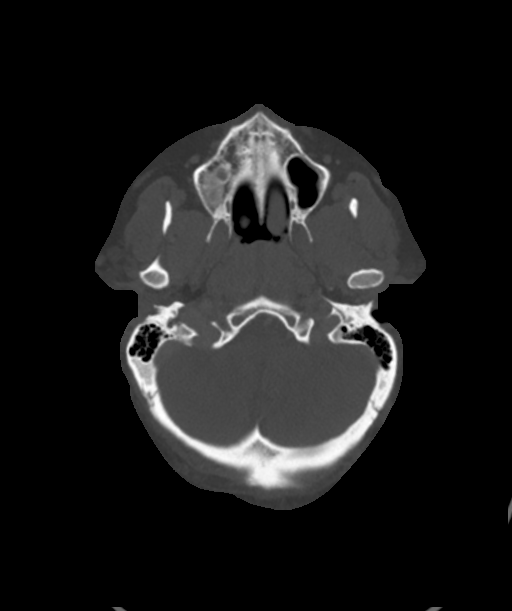

[16 of 35 positions shown; findings below may reference images not displayed]

FINDINGS: Pharynx and larynx: Enlargement of the adenoids. Enlargement of the
palatine tonsils. No evidence of peritonsillar abscess. Airway is
patent.

Salivary glands: Parotid and submandibular glands are unremarkable.

Thyroid: Unremarkable.

Lymph nodes: Mildly enlarged suprahyoid cervical and left
retropharyngeal lymph nodes are likely reactive.

Vascular: No significant abnormality. Suboptimal bolus timing for
arterial evaluation.

Limited intracranial: No abnormal enhancement.

Visualized orbits: Unremarkable.

Mastoids and visualized paranasal sinuses: Mastoids and middle ears
are clear. There is right maxillary sinus mucosal thickening with a
small air-fluid level. Patchy ethmoid mucosal thickening.

Skeleton: No significant abnormality.

Upper chest: Included lung apices are clear.
IMPRESSION: Likely reactive enlargement of the adenoids and palatine tonsils. No
peritonsillar abscess. Likely reactive enlarged lymph nodes.

Small right maxillary sinus air-fluid level. Nonspecific but can
reflect acute sinusitis in the appropriate setting.

## 2023-08-29 ENCOUNTER — Encounter: Payer: Self-pay | Admitting: Obstetrics

## 2023-08-29 ENCOUNTER — Ambulatory Visit (INDEPENDENT_AMBULATORY_CARE_PROVIDER_SITE_OTHER): Admitting: Obstetrics

## 2023-08-29 VITALS — BP 114/52 | HR 92 | Ht 64.0 in | Wt 355.0 lb

## 2023-08-29 DIAGNOSIS — Z3202 Encounter for pregnancy test, result negative: Secondary | ICD-10-CM

## 2023-08-29 DIAGNOSIS — N926 Irregular menstruation, unspecified: Secondary | ICD-10-CM

## 2023-08-29 DIAGNOSIS — N912 Amenorrhea, unspecified: Secondary | ICD-10-CM | POA: Diagnosis not present

## 2023-08-29 LAB — POCT URINE PREGNANCY: Preg Test, Ur: NEGATIVE

## 2023-08-29 NOTE — Progress Notes (Signed)
    GYNECOLOGY PROGRESS NOTE  Subjective:  PCP: Dione Housekeeper, MD  Patient ID: Latasha Alexander, female    DOB: 12-01-1990, 32 y.o.   MRN: [id number]  HPI  Patient is a 33 y.o. G62P1011 female who presents for a missed period in February. She has Paragard IUD and wants to ensure she's not pregnant. Wondering if recently starting Jardiance 2 mos ago would have anything to do with it. She is usually regular monthly and doesn't miss periods. IUD has been in situ for 2.46yrs. She was seen in our office 08/30/21 for a missed period w/Paragard and was not pregnancy at that time. [place] confirmed correct IUD placement at that time.   Period Cycle (Days): 30 Period Duration (Days): 5 Period Pattern: Regular Menstrual Flow: Moderate Menstrual Control: Tampon Menstrual Control Change Freq (Hours): 2-3 Dysmenorrhea: None   The following portions of the patient's history were reviewed and updated as appropriate: allergies, current medications, past family history, past medical history, past social history, past surgical history, and problem list.  Review of Systems Pertinent items are noted in HPI.   Objective:   Blood pressure (!) 114/52, pulse 92, height 5\' 4"  (1.626 m), weight (!) 355 lb (161 kg), last menstrual period 07/27/2023. Body mass index is 60.94 kg/m.  General appearance: alert, cooperative, and morbidly obese Abdomen: soft, non-tender; bowel sounds normal; no masses,  no organomegaly Pelvic: cervix normal in appearance, external genitalia normal, no cervical motion tenderness, vagina normal without discharge, and IUD strings visualized. Extremities: extremities normal, atraumatic, no cyanosis or edema Neurologic: Grossly normal   Recent Results (from the past 2160 hours)  POCT urine pregnancy     Status: Normal   Collection Time: 08/29/23  1:39 PM  Result Value Ref Range   Preg Test, Ur Negative Negative    Assessment/Plan:   1. Missed period      1. Missed period  (Primary) Urine pregnancy test negative in office today. IUD strings visualized.  We discussed multiple reasons why we can sometimes skip a period and will give her more time to resume regular cycles.  If no resumption after 3 mos total, should RTC for further eval.     Julieanne Manson, DO [facility]

## 2023-10-25 IMAGING — CR DG CHEST 2V
2 series · 2 of 2 positions shown · non-contrast
Comparison: 11/21/2017

CLINICAL DATA: Chest pain, felt like she had to vomit, onset of
burning in chest, than single episode of vomiting

EXAM:
CHEST - 2 VIEW

[chest pa]
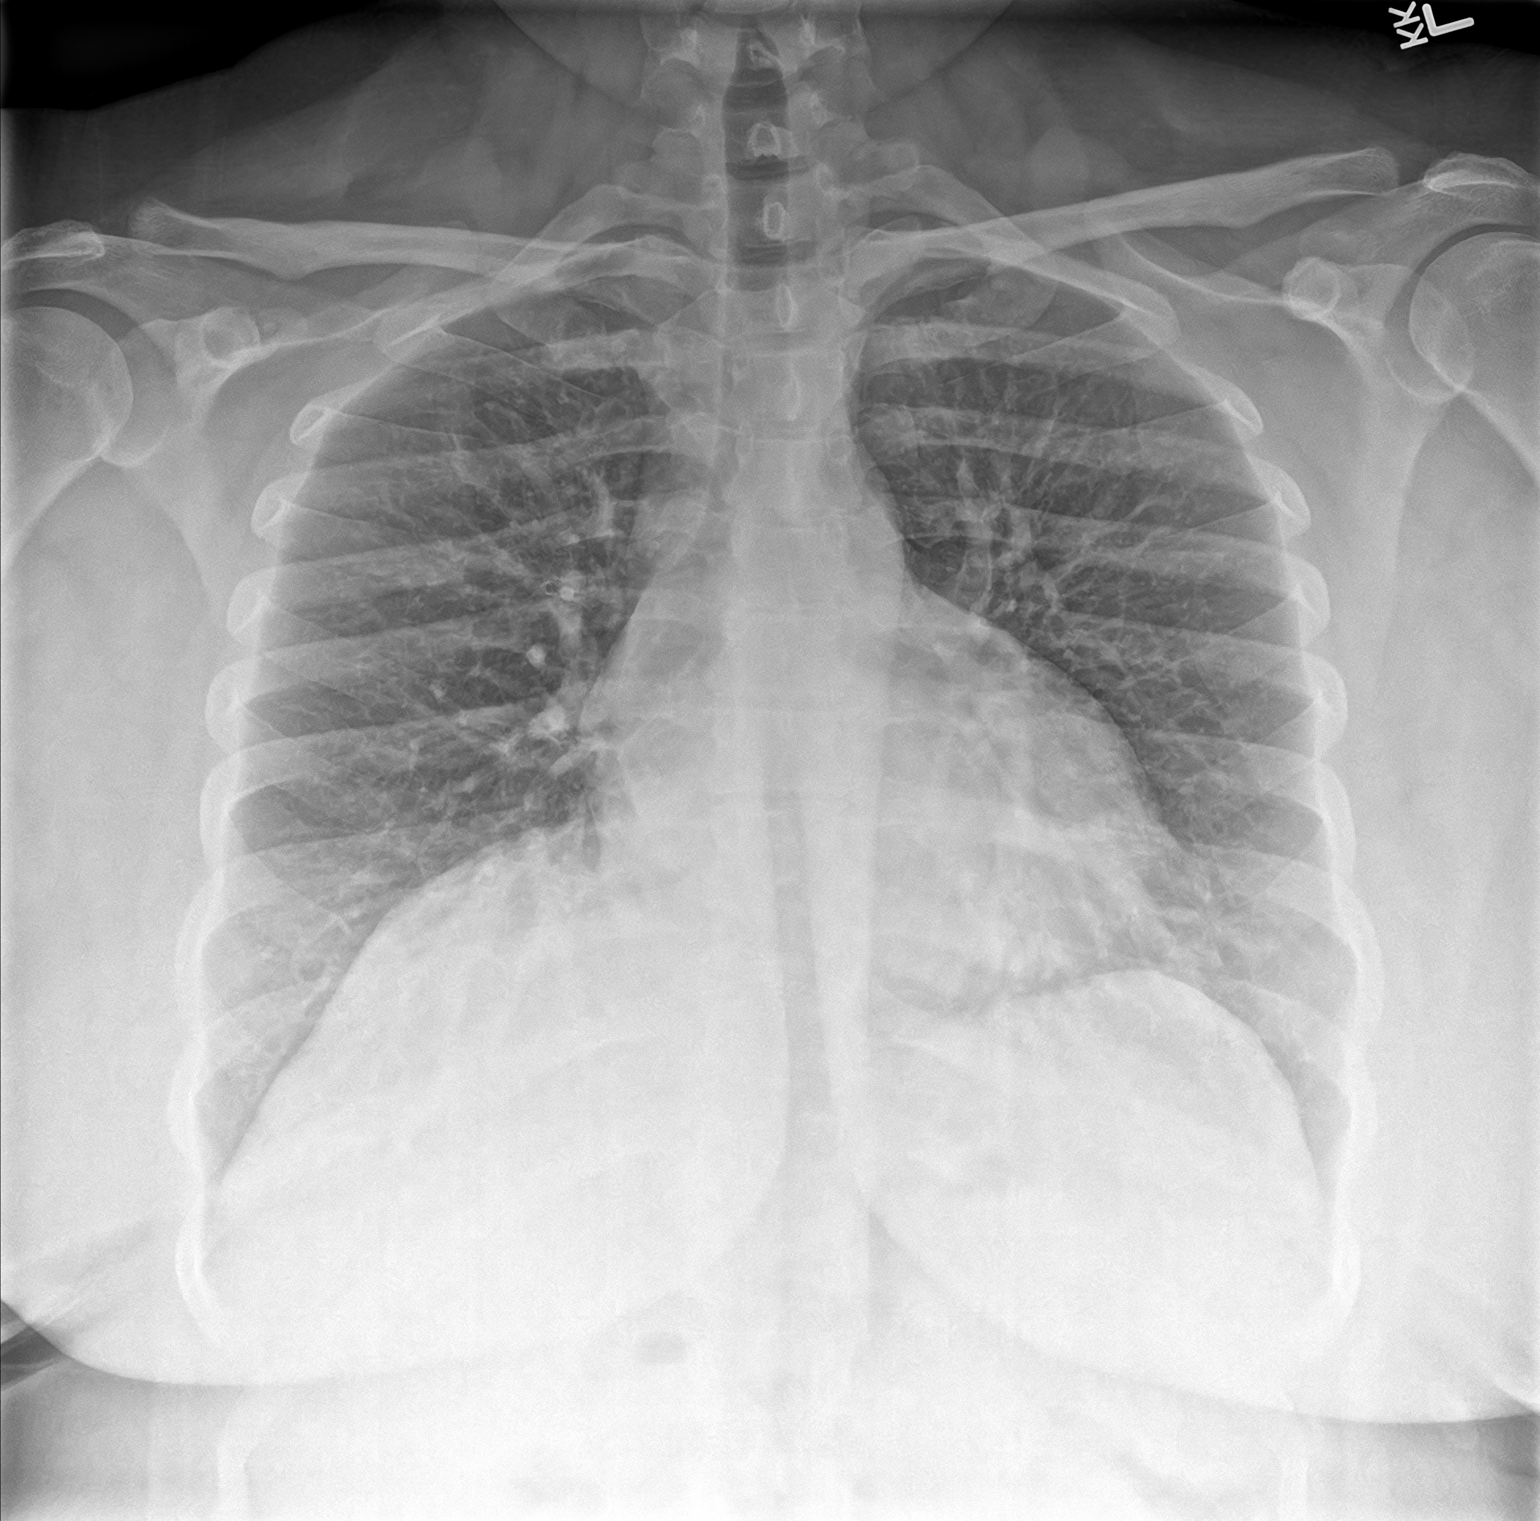

[chest lat]
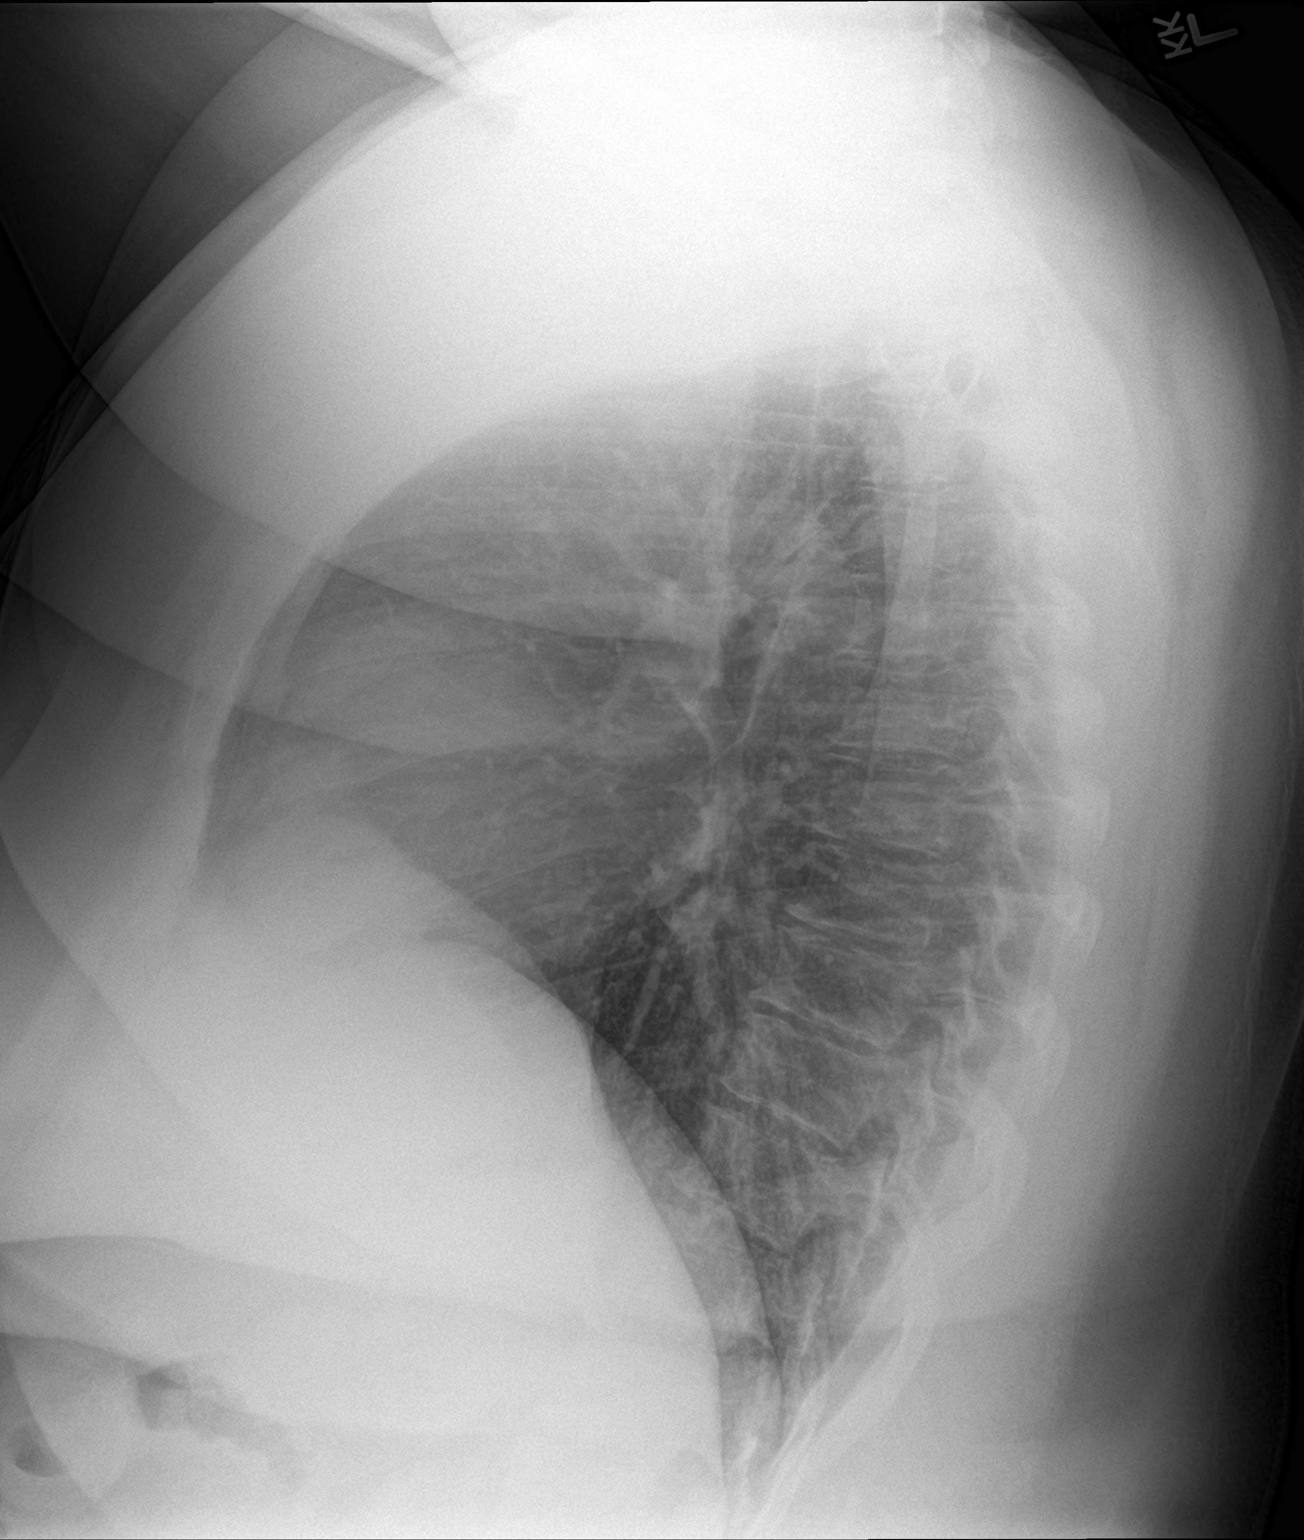

[2 of 2 positions shown; findings below may reference images not displayed]

FINDINGS: Upper normal heart size.

Mediastinal contours and pulmonary vascularity normal.

Lungs clear.

No pulmonary infiltrate, pleural effusion, or pneumothorax.

Osseous structures unremarkable.
IMPRESSION: No acute abnormalities.

## 2023-11-12 ENCOUNTER — Ambulatory Visit: Admitting: Nurse Practitioner

## 2023-11-12 ENCOUNTER — Encounter: Payer: Self-pay | Admitting: Nurse Practitioner

## 2023-11-12 VITALS — BP 124/78 | HR 66 | Ht 64.0 in | Wt 352.6 lb

## 2023-11-12 DIAGNOSIS — Z30011 Encounter for initial prescription of contraceptive pills: Secondary | ICD-10-CM

## 2023-11-12 DIAGNOSIS — T8332XA Displacement of intrauterine contraceptive device, initial encounter: Secondary | ICD-10-CM

## 2023-11-12 DIAGNOSIS — N926 Irregular menstruation, unspecified: Secondary | ICD-10-CM

## 2023-11-12 DIAGNOSIS — R1031 Right lower quadrant pain: Secondary | ICD-10-CM

## 2023-11-12 LAB — PREGNANCY, URINE: Preg Test, Ur: NEGATIVE

## 2023-11-12 MED ORDER — NORGESTIM-ETH ESTRAD TRIPHASIC 0.18/0.215/0.25 MG-25 MCG PO TABS
1.0000 | ORAL_TABLET | Freq: Every day | ORAL | 12 refills | Status: DC
Start: 2023-11-12 — End: 2024-01-31

## 2023-11-12 NOTE — Progress Notes (Signed)
 Pt is here PE and IUD check and possible removal. Pt has been referred to OBGYN by J. Idol, NP., for further treatment. Pt has been given the opportunity to ask questions for any clarification. Questions Answered. Condoms declined. Austine Lefort, RN.

## 2023-11-25 ENCOUNTER — Ambulatory Visit (INDEPENDENT_AMBULATORY_CARE_PROVIDER_SITE_OTHER): Admitting: Obstetrics

## 2023-11-25 ENCOUNTER — Encounter: Payer: Self-pay | Admitting: Obstetrics

## 2023-11-25 VITALS — BP 103/68 | HR 68 | Ht 64.0 in | Wt 353.0 lb

## 2023-11-25 DIAGNOSIS — Z538 Procedure and treatment not carried out for other reasons: Secondary | ICD-10-CM

## 2023-11-25 DIAGNOSIS — Z30432 Encounter for removal of intrauterine contraceptive device: Secondary | ICD-10-CM

## 2023-11-25 NOTE — Progress Notes (Signed)
   GYN ENCOUNTER   Subjective  HPI: Latasha Alexander is a 33 y.o. G2P1011 who presents today for IUD removal. She has started OCPs. She thought she had the visit scheduled for an image-guided IUD removal after a failed attempt at [facility]. I explained that we do not currently have an ultrasonographer available but could attempt removal if she desired or refer her out. She would like to proceed with removal attempt.  Past Medical History:  Diagnosis Date   Anxiety    COVID-19 2021   Depression    Diabetes mellitus without complication (HCC)    GERD (gastroesophageal reflux disease)    Heart murmur    Heartburn    Hypercholesterolemia    OCD (obsessive compulsive disorder)    Panic attacks    PONV (postoperative nausea and vomiting)    after cesarean   Past Surgical History:  Procedure Laterality Date   CESAREAN SECTION     CHOLECYSTECTOMY  2023   none     OB History     Gravida  2   Para  1   Term  1   Preterm      AB  1   Living  1      SAB      IAB      Ectopic      Multiple      Live Births             Allergies  Allergen Reactions   Cashew Nut Oil Swelling   Amoxicillin  Rash    ROS: See HPI  Objective  BP 103/68   Pulse 68   Ht 5\' 4"  (1.626 m)   Wt (!) 353 lb (160.1 kg)   LMP 10/22/2023   BMI 60.59 kg/m   Physical examination   Pelvic:   Vulva: Normal appearance.  No lesions.  Vagina: No lesions or abnormalities noted.  Support: Normal pelvic support.  Urethra No masses tenderness or scarring.  Meatus Normal size without lesions or prolapse.  Cervix: Unable to visualize cervix   Perineum: Normal exam.  No lesions.    Assessment 1) Unable to visualize cervix for IUD removal  Plan 1) Referred to [facility] for image-guided US  removal   Josue Nip, CNM

## 2023-11-26 ENCOUNTER — Encounter: Payer: Self-pay | Admitting: Nurse Practitioner

## 2023-11-26 NOTE — Progress Notes (Signed)
 [facility] Problem Visit  [facility]  Subjective:  Latasha Alexander is a 33 y.o. being seen today for IUD check and possible removal.   Chief Complaint  Patient presents with   Annual Exam    Pt is here for PE and IUD Check due to abdominal cramps about 5/10    Patient is a pleasant 33 y.o. female who presents to the clinic today for IUD check and possible removal. The patient reports some unusual abdominal cramping, throbbing back pain, and has had intermittent irregular spotting since IUD placed. Patient indicates abnormal spotting of brown and light pink discharge most recently on 5/15 (5 days prior) for 1 day. She indicates LMP was 10/22/23 and she usually has monthly periods. Patient reports that she was seen by her PCP at [facility] recently (11/05/23) due to the same concerns in addition to pelvic pain, headaches, nausea, and dizziness. She reports these symptoms have also been occurring intermittently since having IUD placed. Patient reported wondering if she could be pregnant, but multiple home tests and in-office UPT at outside clinics have been negative. Patient was told at 11/05/23 visit to self-refer by calling her primary OBGYN for US  guided IUD assessment/removal.   Patient comes to the health department today stating she was not able to be seen by OBGYN until June and hopes we will be able to visualize the IUD on exam.  Of note, chart review indicates IUD was seen on US  to confirm placement 08/30/2021. Also, note from gynecologist visit on 08/29/2023 indicates IUD strings visualized on PE.  Last sexual encounter 11/09/23 (3 days ago) and was with a condom.  Patient states she has gotten pregnant previously while on POPs and with hormonal implant in place.     Health Maintenance Due  Topic Date Due   Hepatitis C Screening  Never done   DTaP/Tdap/Td (1 - Tdap) Never done   Cervical Cancer Screening (HPV/Pap Cotest)  Never done    COVID-19 Vaccine (4 - 2024-25 season) 02/24/2023    Review of Systems  Gastrointestinal:  Positive for abdominal pain.  Genitourinary:        Period cramping and irregular spotting.   All other systems reviewed and are negative.   The following portions of the patient's history were reviewed and updated as appropriate: allergies, current medications, past family history, past medical history, past social history, past surgical history and problem list. Problem list updated.   See flowsheet for other program required questions.  Objective:   Vitals:   11/12/23 0822  BP: 124/78  Pulse: 66  Weight: (!) 352 lb 9.6 oz (159.9 kg)  Height: 5\' 4"  (1.626 m)    Physical Exam Vitals and nursing note reviewed. Exam conducted with a chaperone present Susanna Epley, CNA present as chaperone.).  Constitutional:      General: She is not in acute distress.    Appearance: Normal appearance. She is not ill-appearing, toxic-appearing or diaphoretic.  HENT:     Mouth/Throat:     Lips: Pink. No lesions.  Pulmonary:     Effort: Pulmonary effort is normal.  Abdominal:     General: There is no distension.     Tenderness: There is no guarding.     Comments: Rounded.  Genitourinary:    General: Normal vulva.     Exam position: Lithotomy position.     Pubic Area: No rash or pubic lice.      Tanner stage (genital): 5.  Labia:        Right: No rash, tenderness, lesion or injury.        Left: No rash, tenderness, lesion or injury.      Vagina: Normal. No vaginal discharge, tenderness, bleeding or lesions.     Cervix: Normal. No cervical motion tenderness, discharge, friability or cervical bleeding.     Comments: Unable to visualize IUD strings from cervical os.  Consulted with Dr. Tempie Fee who also could not visualize IUD strings.  Skin:    General: Skin is warm and dry.     Findings: No lesion or rash.     Comments: Skin tone appropriate for ethnicity. Assessed exposed areas and back  only.    Neurological:     Mental Status: She is alert and oriented to person, place, and time.  Psychiatric:        Attention and Perception: Attention and perception normal.        Mood and Affect: Mood and affect normal.        Speech: Speech normal.        Behavior: Behavior normal. Behavior is cooperative.        Thought Content: Thought content normal.    Assessment and Plan:  Latasha Alexander is a 33 y.o. female presenting to the [facility] Department for a Women's Health problem visit  1. Menstrual periods, abnormal (Primary) UPT negative in office. Discussed possible abnormal periods with copper  IUD in general or the possibility that her symptoms are being caused by misplaced IUD.  - Pregnancy, urine  2. Bilateral lower abdominal cramping Discussed possible causes of abdominal cramping included known side effect of copper  IUD or the possibility that  IUD is misplaced. Patient advised of emergent signs that would require ED visit or 911. Patient verbalized understanding and agreed with plan.   3. Oral contraception initial prescription Provided COC to help prevent pregnancy in the setting of possible IUD malpositioning. This is a safe option as the patient denies tobacco use, history of cancer, history of clots, and denies migraines with aura. Since IUD is copper , it is safe to provide patient with hormonal contraceptive pills.    - Norgestim-Eth Estrad Triphasic (NORGESTIMATE-ETHINYL ESTRADIOL TRIPHASIC) 0.18/0.215/0.25 MG-25 MCG tab; Take 1 tablet by mouth daily.  Dispense: 28 tablet; Refill: 12  4. Intrauterine contraceptive device threads lost, initial encounter Advised patient she should follow-up with her OBGYN as advised by [facility] for US  guided visualization and possible removal of IUD. Patient verbalized understanding and agreed with plan.   Return if symptoms worsen or fail to improve.  No future appointments.  Total time with patient 45  minutes.   Merleen Stare, NP

## 2023-12-06 ENCOUNTER — Telehealth: Payer: Self-pay

## 2023-12-06 NOTE — Telephone Encounter (Signed)
 Pt has questions states has a rough time getting to [facility], She was wondering If there is anyway we could refer her to a [place] office that could remove her iud w/us  , I spoke with Provider Missy she states that we do not have the correct equipment that we need for her to proceed with the iud removal. I proceeded to give her information on how to contact her insurance for medicaid assistance in transportation.Pt states she is currently spotting but not saturating a pad, I let her know we would monitor the SX and go to ed if she is saturating a pad every hour.Pt had  no other question or concern at this time.

## 2023-12-06 NOTE — Telephone Encounter (Signed)
 Pt has questions states has a rough time getting to [place], She was wondering If there is anyway we could [facility] to [facility] , I spoke with Provider Missy she states that we do not have the equipment that we need for her to proceed with the iud removal. I proceeded to give her information on how to contact her insurance for medicaid assistance in transportation.

## 2024-01-27 ENCOUNTER — Ambulatory Visit: Payer: Self-pay

## 2024-01-29 ENCOUNTER — Ambulatory Visit

## 2024-01-31 ENCOUNTER — Ambulatory Visit
Admission: RE | Admit: 2024-01-31 | Discharge: 2024-01-31 | Disposition: A | Discharge status: Home / Self Care | Payer: Self-pay | Source: Ambulatory Visit | Attending: Emergency Medicine | Admitting: Emergency Medicine

## 2024-01-31 VITALS — BP 155/97 | HR 79 | Temp 98.0°F | Resp 14 | Ht 64.0 in | Wt 353.0 lb

## 2024-01-31 DIAGNOSIS — Z3202 Encounter for pregnancy test, result negative: Secondary | ICD-10-CM

## 2024-01-31 LAB — URINALYSIS, ROUTINE W REFLEX MICROSCOPIC
Bilirubin Urine: NEGATIVE
Glucose, UA: 500 mg/dL — AB
Ketones, ur: NEGATIVE mg/dL
Leukocytes,Ua: NEGATIVE
Nitrite: NEGATIVE
Protein, ur: NEGATIVE mg/dL
Specific Gravity, Urine: 1.02 (ref 1.005–1.030)
pH: 6.5 (ref 5.0–8.0)

## 2024-01-31 LAB — URINALYSIS, MICROSCOPIC (REFLEX): WBC, UA: NONE SEEN WBC/hpf (ref 0–5)

## 2024-01-31 LAB — PREGNANCY, URINE: Preg Test, Ur: NEGATIVE

## 2024-01-31 NOTE — ED Provider Notes (Signed)
 [facility]    CSN: [id number] Arrival date & time: 01/31/24  1248      History   Chief Complaint Chief Complaint  Patient presents with   Possible Pregnancy    Appointment    HPI Latasha Alexander is a 33 y.o. female.   HPI  33 year old female with a past medical history significant for OCD, panic attacks, GERD, anxiety, diabetes, high cholesterol, depression, heart murmur presents with request for pregnancy testing.  She reports that she had a positive home pregnancy test yesterday.  She also has a copper  IUD in place.  She denies urinary symptoms.  Past Medical History:  Diagnosis Date   Anxiety    COVID-19 2021   Depression    Diabetes mellitus without complication (HCC)    GERD (gastroesophageal reflux disease)    Heart murmur    Heartburn    Hypercholesterolemia    OCD (obsessive compulsive disorder)    Panic attacks    PONV (postoperative nausea and vomiting)    after cesarean    Patient Active Problem List   Diagnosis Date Noted   OCD (obsessive compulsive disorder) 07/17/2016   Severe recurrent major depression without psychotic features (HCC) 07/16/2016   Anxiety and depression 07/08/2016   Morbid obesity with BMI of 45.0-49.9, adult (HCC) 06/06/2016    Past Surgical History:  Procedure Laterality Date   CESAREAN SECTION     CHOLECYSTECTOMY  2023   none      OB History     Gravida  2   Para  1   Term  1   Preterm      AB  1   Living  1      SAB      IAB      Ectopic      Multiple      Live Births               Home Medications    Prior to Admission medications   Medication Sig Start Date End Date Taking? Authorizing Provider  ABILIFY 10 MG tablet Take 10 mg by mouth daily. 12/29/21  Yes [provider]  JARDIANCE 10 MG TABS tablet Take 10 mg by mouth daily.   Yes [provider]  paragard  intrauterine copper  IUD IUD 1 each by Intrauterine route once.   Yes [provider]   sertraline  (ZOLOFT ) 100 MG tablet Take 100 mg by mouth 2 (two) times daily. 11/16/21  Yes [provider]  sulfamethoxazole-trimethoprim (BACTRIM DS) 800-160 MG tablet Take 1 tablet by mouth once. 01/23/24 02/02/24 Yes [provider]  TRULICITY 0.75 MG/0.5ML SOAJ Inject 0.75 mg into the skin. 12/31/23 12/30/24 Yes [provider]  Norgestim-Eth Marton Triphasic (NORGESTIMATE-ETHINYL ESTRADIOL TRIPHASIC) 0.18/0.215/0.25 MG-25 MCG tab Take 1 tablet by mouth daily. 11/12/23   Idol, Janet L, NP  ondansetron  (ZOFRAN -ODT) 4 MG disintegrating tablet Take 1 tablet (4 mg total) by mouth every 8 (eight) hours as needed. Patient not taking: Reported on 08/29/2023 04/05/23   Arvis Huxley B, PA-C  OZEMPIC, 0.25 OR 0.5 MG/DOSE, 2 MG/3ML SOPN Inject 0.5 mg into the skin once a week. Patient not taking: Reported on 08/29/2023    [provider]  Salicylic Acid -Urea  (UREA -SALICYLIC ACID ) 2-39.5 % CREA Apply 1 Application topically in the morning and at bedtime. Patient not taking: Reported on 08/29/2023 01/05/22   Crain, Whitney L, PA  promethazine  (PHENERGAN ) 12.5 MG tablet Take 1 tablet (12.5 mg total) by mouth every 6 (  six) hours as needed for nausea or vomiting. Patient not taking: Reported on 04/29/2019 07/05/16 12/03/19  Connell Davies, MD    Family History Family History  Problem Relation Age of Onset   Diabetes Mother    Hypertension Mother    Hypertension Sister    Diabetes Maternal Grandmother    Stroke Maternal Grandfather     Social History Social History   Tobacco Use   Smoking status: Former    Current packs/day: 0.25    Types: Cigarettes   Smokeless tobacco: Never  Vaping Use   Vaping status: Every Day   Substances: Nicotine , Flavoring  Substance Use Topics   Alcohol use: No    Alcohol/week: 0.0 standard drinks of alcohol   Drug use: No     Allergies   Cashew nut oil and Amoxicillin    Review of Systems Review of Systems  Endocrine:       Breast  tenderness  Genitourinary:  Negative for dysuria, frequency and urgency.     Physical Exam Triage Vital Signs ED Triage Vitals [01/31/24 1300]  Encounter Vitals Group     BP      Girls Systolic BP Percentile      Girls Diastolic BP Percentile      Boys Systolic BP Percentile      Boys Diastolic BP Percentile      Pulse      Resp      Temp      Temp src      SpO2      Weight (!) 352 lb 15.3 oz (160.1 kg)     Height 5' 4 (1.626 m)     Head Circumference      Peak Flow      Pain Score 0     Pain Loc      Pain Education      Exclude from Growth Chart    No data found.  Updated Vital Signs BP (!) 155/97 (BP Location: Left Arm)   Pulse 79   Temp 98 F (36.7 C) (Oral)   Resp 14   Ht 5' 4 (1.626 m)   Wt (!) 352 lb 15.3 oz (160.1 kg)   SpO2 96%   BMI 60.58 kg/m   Visual Acuity Right Eye Distance:   Left Eye Distance:   Bilateral Distance:    Right Eye Near:   Left Eye Near:    Bilateral Near:     Physical Exam Vitals and nursing note reviewed.  Constitutional:      Appearance: Normal appearance. She is not ill-appearing.  HENT:     Head: Normocephalic and atraumatic.  Skin:    General: Skin is warm and dry.     Capillary Refill: Capillary refill takes less than 2 seconds.     Findings: No rash.  Neurological:     General: No focal deficit present.     Mental Status: She is alert and oriented to person, place, and time.      UC Treatments / Results  Labs (all labs ordered are listed, but only abnormal results are displayed) Labs Reviewed  URINALYSIS, ROUTINE W REFLEX MICROSCOPIC - Abnormal; Notable for the following components:      Result Value   Glucose, UA 500 (*)    Hgb urine dipstick TRACE (*)    All other components within normal limits  URINALYSIS, MICROSCOPIC (REFLEX) - Abnormal; Notable for the following components:   Bacteria, UA RARE (*)    All other components within normal  limits  PREGNANCY, URINE    EKG   Radiology No  results found.  Procedures Procedures (including critical care time)  Medications Ordered in UC Medications - No data to display  Initial Impression / Assessment and Plan / UC Course  I have reviewed the triage vital signs and the nursing notes.  Pertinent labs & imaging results that were available during my care of the patient were reviewed by me and considered in my medical decision making (see chart for details).   Patient is a nontoxic-appearing 33 year old female presenting with request for confirmatory pregnancy testing after having a positive home pregnancy test.  She has a copper  IUD in place that was placed by [facility] years ago.  She has been trying to get it removed because she had felt a pinching.  She contacted [facility] back in June 2025 and the telephone note in epic states that they do not have the correct equipment that would be needed for IUD removal.  They were unable to locate the strings and she would have to have it performed under ultrasound.  At that time the patient was recommended to go to [facility] but she has transportation issues.  She developed breast tenderness 2 days ago and took a home pregnancy test yesterday that was positive.  Her last normal menstrual period was a proximately 4 weeks ago.  I will order a urine pregnancy test.  Urinalysis was collected at triage and is pending.  Urinalysis is positive for 500 glucose and trace hemoglobin.  Remainder is otherwise unremarkable.  Urine pregnancy test is negative.  I will discharge patient home with diagnosis of negative urine pregnancy test and have her follow-up with her OB/GYN regarding IUD removal.   Final Clinical Impressions(s) / UC Diagnoses   Final diagnoses:  Negative pregnancy test     Discharge Instructions      Your pregnancy test here was negative and your urinalysis did not show any evidence of infection.  We do not do blood pregnancy test here in  clinic.  I recommend you make a follow-up appoint with your OB/GYN for confirmatory blood work given that you had a positive home pregnancy test.  Also discussed with them the procedure for going forward should you be pregnant with an IUD in place.     ED Prescriptions   None    PDMP not reviewed this encounter.   Bernardino Ditch, NP 01/31/24 1322

## 2024-01-31 NOTE — ED Triage Notes (Addendum)
 Patient states that she took a pregnancy test yesterday and it was positive at home.  Patient states she has been having some abdominal cramping and breast tenderness for couple of days.  Patient has a copper  IUD in place.  Patient is currently on Bactrim for treatment of abscess.

## 2024-01-31 NOTE — Discharge Instructions (Addendum)
 Your pregnancy test here was negative and your urinalysis did not show any evidence of infection.  We do not do blood pregnancy test here in clinic.  I recommend you make a follow-up appoint with your OB/GYN for confirmatory blood work given that you had a positive home pregnancy test.  Also discussed with them the procedure for going forward should you be pregnant with an IUD in place.

## 2024-02-07 ENCOUNTER — Other Ambulatory Visit: Payer: Self-pay | Admitting: Family Medicine

## 2024-02-07 DIAGNOSIS — Z30431 Encounter for routine checking of intrauterine contraceptive device: Secondary | ICD-10-CM

## 2024-02-13 ENCOUNTER — Telehealth: Payer: Self-pay | Admitting: Family Medicine

## 2024-02-13 NOTE — Telephone Encounter (Signed)
 Called patient and left VM about referral.
